# Patient Record
Sex: Male | Born: 1980 | Race: Black or African American | Hispanic: No | Marital: Single
Health system: Midwestern US, Community
[De-identification: ages and names within clinical notes are randomized; demographics above are authoritative.]

## PROBLEM LIST (undated history)

## (undated) DIAGNOSIS — S069X9A Unspecified intracranial injury with loss of consciousness of unspecified duration, initial encounter: Secondary | ICD-10-CM

## (undated) DIAGNOSIS — F419 Anxiety disorder, unspecified: Secondary | ICD-10-CM

## (undated) DIAGNOSIS — S12600A Unspecified displaced fracture of seventh cervical vertebra, initial encounter for closed fracture: Secondary | ICD-10-CM

## (undated) DIAGNOSIS — I10 Essential (primary) hypertension: Secondary | ICD-10-CM

## (undated) DIAGNOSIS — F32A Depression, unspecified: Secondary | ICD-10-CM

## (undated) DIAGNOSIS — S069XAA Unspecified intracranial injury with loss of consciousness status unknown, initial encounter: Secondary | ICD-10-CM

## (undated) DIAGNOSIS — E559 Vitamin D deficiency, unspecified: Secondary | ICD-10-CM

## (undated) DIAGNOSIS — J939 Pneumothorax, unspecified: Secondary | ICD-10-CM

## (undated) DIAGNOSIS — I609 Nontraumatic subarachnoid hemorrhage, unspecified: Secondary | ICD-10-CM

## (undated) DIAGNOSIS — R000 Tachycardia, unspecified: Secondary | ICD-10-CM

## (undated) HISTORY — DX: Nontraumatic subarachnoid hemorrhage, unspecified: I60.9

## (undated) HISTORY — DX: Unspecified intracranial injury with loss of consciousness of unspecified duration, initial encounter: S06.9X9A

## (undated) HISTORY — DX: Tachycardia, unspecified: R00.0

## (undated) HISTORY — DX: Unspecified intracranial injury with loss of consciousness status unknown, initial encounter: S06.9XAA

## (undated) HISTORY — PX: APPENDECTOMY: SHX54

## (undated) HISTORY — DX: Pneumothorax, unspecified: J93.9

## (undated) HISTORY — DX: Unspecified displaced fracture of seventh cervical vertebra, initial encounter for closed fracture: S12.600A

## (undated) HISTORY — DX: Vitamin D deficiency, unspecified: E55.9

---

## 2009-08-20 MED ORDER — DOXYCYCLINE 100 MG CAP
100 mg | ORAL_CAPSULE | Freq: Two times a day (BID) | ORAL | Status: AC
Start: 2009-08-20 — End: 2009-08-27

## 2009-08-20 MED ORDER — VALACYCLOVIR 1 G TAB
1 gram | ORAL_TABLET | Freq: Two times a day (BID) | ORAL | Status: AC
Start: 2009-08-20 — End: 2009-08-27

## 2009-08-20 MED ADMIN — azithromycin (ZITHROMAX) tablet 2,000 mg: ORAL | @ 20:00:00 | NDC 68084027811

## 2009-08-20 MED FILL — AZITHROMYCIN 250 MG TAB: 250 mg | ORAL | Qty: 8

## 2009-08-20 NOTE — ED Provider Notes (Signed)
I have personally seen and evaluated patient. I find the patient's history and physical exam are consistent with the PA's NP documentation. I agree with the care provided, treatments rendered, disposition and follow up plan.

## 2009-08-20 NOTE — ED Provider Notes (Signed)
Patient is a 28 y.o. male presenting with penile pain. The history is provided by the patient. No language interpreter was used.   Penis Pain  This is a new problem. The current episode started yesterday. The problem occurs daily. The problem has been gradually worsening. Primary symptoms include dysuria, genital lesions, genital rash, penile pain and swelling.Pertinent negatives include no genital itching, no penile discharge, no testicular mass and no scrotal pain. The symptoms occur during urination and at rest. Pertinent negatives include no nausea, no vomiting, no abdominal pain and no frequency. There has been no fever.  He has tried nothing for the symptoms. The treatment provided no relief. Sexual activity: sexually active. He inconsistently uses condoms.  Partner displays symptoms of an STD: no.        No past medical history on file.     No past surgical history on file.      No family history on file.     History   Social History   ??? Marital Status: Single     Spouse Name: N/A     Number of Children: N/A   ??? Years of Education: N/A   Occupational History   ??? Not on file.   Social History Main Topics   ??? Tobacco Use: Yes -- 0.5 packs/day   ??? Alcohol Use: Yes   ??? Drug Use: Yes     Special: Marijuana   ??? Sexually Active: Yes   Other Topics Concern   ??? Not on file   Social History Narrative   ??? No narrative on file           ALLERGIES: Pcn      Review of Systems   Constitutional: Negative for fever and chills.   HENT: Negative for neck pain and neck stiffness.    Respiratory: Negative for cough and shortness of breath.    Cardiovascular: Negative for chest pain.   Gastrointestinal: Negative for nausea, vomiting and abdominal pain.   Genitourinary: Positive for dysuria, penile swelling, genital sore and penile pain. Negative for frequency and penile discharge.   Musculoskeletal: Negative for arthralgias.   Skin: Negative for color change.   Neurological: Negative for dizziness.       Filed Vitals:     08/20/2009 12:14 PM   BP: 138/85   Pulse: 64   Temp: 97.6 ??F (36.4 ??C)   Resp: 16   Height: 6' (1.829 m)   Weight: 150 lb (68.04 kg)   SpO2: 98%              Physical Exam   Nursing note and vitals reviewed.  Constitutional: He is oriented to person, place, and time. He appears well-developed and well-nourished. No distress.   HENT:   Head: Normocephalic and atraumatic.   Right Ear: External ear normal.   Left Ear: External ear normal.   Mouth/Throat: Oropharynx is clear and moist. No oropharyngeal exudate.   Eyes: Conjunctivae and extraocular motions are normal. Pupils are equal, round, and reactive to light. Right eye exhibits no discharge. Left eye exhibits no discharge. No scleral icterus.   Neck: Normal range of motion. Neck supple. No tracheal deviation present. No thyromegaly present.   Cardiovascular: Normal rate, regular rhythm, normal heart sounds and intact distal pulses.    No murmur heard.  Pulmonary/Chest: Effort normal and breath sounds normal. No respiratory distress. He has no wheezes. He has no rales.   Abdominal: Soft. Bowel sounds are normal. He exhibits mass. He exhibits no distension.  No tenderness. He has no rebound and no guarding.        Several swollen tender lymph nodes L femoral area   Genitourinary: Swelling present in the scrotum and/or testes.   Musculoskeletal: Normal range of motion. He exhibits no edema and no tenderness.   Lymphadenopathy:     He has no cervical adenopathy.   Neurological: He is alert and oriented to person, place, and time. No cranial nerve deficit. Coordination normal.   Skin: Skin is warm. Rash noted. No erythema.        Multiple blisters -4 on head of penis no discharge mild swelling distal shank and head of penis.   Psychiatric: He has a normal mood and affect. His behavior is normal. Judgment and thought content normal.        Coding    Procedures

## 2009-08-24 LAB — HSV CULTURE WITHOUT TYPING

## 2015-06-27 ENCOUNTER — Emergency Department: Admit: 2015-06-28 | Payer: Self-pay

## 2015-06-27 DIAGNOSIS — R0789 Other chest pain: Secondary | ICD-10-CM

## 2015-06-27 NOTE — ED Notes (Signed)
Reports pain to L chest " a stinging pain" off and on.  First started a few years ago.  Pain is chronic.  States that pain worsened at 3pm today, intermittent, intense per patient.  Also complaining of dizziness.  Reports SOB earlier during onset at 3pm today, but has since resolved.  Denies nausea.  Increased pain upon palpation to L side of chest.  Denies heavy lifting or exercises.      Emergency Department Nursing Plan of Care       The Nursing Plan of Care is developed from the Nursing assessment and Emergency Department Attending provider initial evaluation.  The plan of care may be reviewed in the ???ED Provider note???.    The Plan of Care was developed with the following considerations:   Patient / Family readiness to learn indicated ZO:XWRUEAVWUJby:verbalized understanding  Persons(s) to be included in education: patient  Barriers to Learning/Limitations:No    Signed     Jayme CloudShannon P Richardson    06/27/2015   8:21 PM

## 2015-06-27 NOTE — ED Notes (Signed)
Discharge completed by provider.

## 2015-06-27 NOTE — ED Provider Notes (Signed)
HPI Comments: Shawn Hodge is a 34 y.o. male who presents ambulatory to [facility] ED with cc of acute on chronic onset of left chest soreness to the area surrounding the left nipple. Pt notes that symptoms first onset a few years ago while he was in jail and have since recurred numerous times, but notes that pain often resolves after a short period of time without intervention. He describes the pain as a "knot" in the left chest wall and pain surrounding the left nipple. Pt states that tonight's exacerbation of pain has been more severe than prior incidents and has not resolved, prompting his presentation to the ED. He states that tonight's symptoms onset with associated SOB, which he states has since resolved. He denies taking any medications for relief of symptoms or any other relieving or exacerbating factors. He denies any recent lifting or pulling motions and he specifically denies any fevers, chills, nausea, vomiting, headache, rash, diarrhea, sweating or weight loss.    WGN:FAOZPCP:None    PMHx: none  Social Hx: + smoking; - EtOH; - drug use    There are no other complains, changes, or physical findings at this time.    The history is provided by the patient. No language interpreter was used.        History reviewed. No pertinent past medical history.    History reviewed. No pertinent past surgical history.      History reviewed. No pertinent family history.    Social History     Social History   ??? Marital status: SINGLE     Spouse name: N/A   ??? Number of children: N/A   ??? Years of education: N/A     Occupational History   ??? Not on file.     Social History Main Topics   ??? Smoking status: Current Some Day Smoker     Packs/day: 0.50   ??? Smokeless tobacco: Not on file   ??? Alcohol use No   ??? Drug use: No   ??? Sexual activity: Yes     Other Topics Concern   ??? Not on file     Social History Narrative         ALLERGIES: Pcn [penicillins]    Review of Systems    Constitutional: Negative.  Negative for activity change, appetite change, chills, fatigue, fever and unexpected weight change.   HENT: Negative.  Negative for congestion, hearing loss, rhinorrhea, sneezing and voice change.    Eyes: Negative.  Negative for pain and visual disturbance.   Respiratory: Positive for shortness of breath. Negative for apnea, cough, choking and chest tightness.    Cardiovascular: Positive for chest pain. Negative for palpitations.   Gastrointestinal: Negative.  Negative for abdominal distention, abdominal pain, blood in stool, diarrhea, nausea and vomiting.   Genitourinary: Negative.  Negative for difficulty urinating, flank pain, frequency and urgency.        No discharge   Musculoskeletal: Negative.  Negative for arthralgias, back pain, myalgias and neck stiffness.   Skin: Negative.  Negative for color change and rash.   Neurological: Negative.  Negative for dizziness, seizures, syncope, speech difficulty, weakness, numbness and headaches.   Hematological: Negative for adenopathy.   Psychiatric/Behavioral: Negative.  Negative for agitation, behavioral problems, dysphoric mood and suicidal ideas. The patient is not nervous/anxious.    All other systems reviewed and are negative.      Patient Vitals for the past 12 hrs:   Temp Pulse Resp BP SpO2   06/27/15 2133 - - - -  100 %   06/27/15 2131 - - - (!) 134/99 -   06/27/15 2011 98.1 ??F (36.7 ??C) 65 15 (!) 132/96 100 %            Physical Exam   Nursing note and vitals reviewed.  Physical Examination: General appearance - WDWN, in no apparent distress  Head - NC/AT  Eyes - pupils equal, round  and reactive, extraocular eye movements intact, conj/sclera clear, anicteric  Mouth - mucous membranes moist, pharynx normal without lesions  Nose/Ears - nares clear, Tms & canals clear  Neck - supple, no significant adenopathy, trachea midline, no crepitus, c spine diffusely non-tender, no step offs   Chest - Normal respiratory effort, clear to auscultation bilaterally, no wheezes/rales/rhonchi; tenderness of the left chest wall  Heart - normal rate and regular rhythm, S1 and S2 normal, no murmurs, gallops, or rubs  Abdomen - soft, nontender, nondistended, nabs, no masses, guarding, rebound or rigidity  Neurological - alert, oriented, normal speech, cranial nerves intact, no focal motor findings, motor & sensory diffusely intact, normal gait  Extremities/MS - peripheral pulses normal, no pedal edema, all joints atraumatic, FROM, non-tender, no gross deformities, spine diffusely non-tender  Skin - normal coloration and turgor, no rashes, no lesions or lacerations      MDM  Number of Diagnoses or Management Options  Diagnosis management comments: DDx: costochondritis, cyst, pericarditis, pneumothorax       Amount and/or Complexity of Data Reviewed  Clinical lab tests: reviewed and ordered  Tests in the radiology section of CPT??: ordered and reviewed  Tests in the medicine section of CPT??: ordered and reviewed  Review and summarize past medical records: yes  Independent visualization of images, tracings, or specimens: yes      ED Course       Procedures    EKG interpretation: (20:42)  Rhythm: sinus bradycardia; and regular . Rate (approx.): 55 bpm; ST/T wave: ST elevation with early repolarization.    LABORATORY TESTS:  Recent Results (from the past 12 hour(s))   EKG, 12 LEAD, INITIAL    Collection Time: 06/27/15  8:42 PM   Result Value Ref Range    Ventricular Rate 55 BPM    Atrial Rate 55 BPM    P-R Interval 194 ms    QRS Duration 96 ms    Q-T Interval 380 ms    QTC Calculation (Bezet) 363 ms    Calculated P Axis 21 degrees    Calculated R Axis 73 degrees    Calculated T Axis 53 degrees    Diagnosis       Sinus bradycardia  ST elevation, probably due to early repolarization  No previous ECGs available     POC TROPONIN-I    Collection Time: 06/27/15  9:28 PM   Result Value Ref Range     Troponin-I (POC) <0.04 0.00 - 0.08 ng/mL   POC CHEM8    Collection Time: 06/27/15  9:35 PM   Result Value Ref Range    Calcium, ionized (POC) 1.14 1.12 - 1.32 MMOL/L    Sodium (POC) 136 136 - 145 MMOL/L    Potassium (POC) 3.8 3.5 - 5.1 MMOL/L    Chloride (POC) 104 98 - 107 MMOL/L    CO2 (POC) 27 21 - 32 MMOL/L    Anion gap (POC) 10 5 - 15 mmol/L    Glucose (POC) 100 75 - 110 MG/DL    BUN (POC) 17 9 - 20 MG/DL    Creatinine (POC) 1.0  0.6 - 1.3 MG/DL    GFR-AA (POC) >25 >36 ml/min/1.27m2    GFR, non-AA (POC) >60 >60 ml/min/1.24m2    Hemoglobin (POC) 15.3 12.1 - 17.0 GM/DL    Hematocrit (POC) 45 36.6 - 50.3 %    Comment Comment Not Indicated.         IMAGING RESULTS:  EXAM: XR CHEST PA LAT  ??  INDICATION: cp  ??  COMPARISON: None.  ??  FINDINGS: PA and lateral radiographs of the chest demonstrate clear lungs. The  cardiac and mediastinal contours and pulmonary vascularity are normal. The  bones and soft tissues are within normal limits. ??  ??  IMPRESSION  IMPRESSION: No acute abnormality identified  ??   ??       MEDICATIONS GIVEN:  Medications   ketorolac (TORADOL) injection 60 mg (60 mg IntraMUSCular Given 06/27/15 2119)       IMPRESSION:  1. Muscular chest pain        PLAN:  1.   Current Discharge Medication List      START taking these medications    Details   ibuprofen (MOTRIN) 600 mg tablet Take 1 Tab by mouth every six (6) hours as needed for Pain.  Qty: 20 Tab, Refills: 0           2.   Follow-up Information     Follow up With Details Comments Contact Info    Henrietta Hoover, NP   [address] [facility] [address]  [address]  [facility] [place] [address]  [phone]      Hayes Ludwig, MD   [address]  [address]  [place] [address]  [phone]          3. Return to ED if worse     Discharge Note:  9:59 PM  The patient has been re-evaluated and is ready for discharge. Reviewed available results with patient. Counseled patient on diagnosis and care  plan. Patient has expressed understanding, and all questions have been answered. Patient agrees with plan and agrees to follow up as recommended, or to return to the ED if their symptoms worsen. Discharge instructions have been provided and explained to the patient, along with reasons to return to the ED.        This note is prepared by John Giovanni, acting as Scribe for [employer]. Dorothey Baseman, MD.    Vergia Alcon S. Dorothey Baseman, MD: The scribe's documentation has been prepared under my direction and personally reviewed by me in its entirety. I confirm that the note above accurately reflects all work, treatment, procedures, and medical decision making performed by me.

## 2015-06-27 NOTE — ED Notes (Signed)
20G IV inserted to L AC.  Labs drawn.

## 2015-06-28 ENCOUNTER — Inpatient Hospital Stay
Admit: 2015-06-28 | Discharge: 2015-06-28 | Disposition: A | Discharge status: Home / Self Care | Payer: Self-pay | Attending: Emergency Medicine

## 2015-06-28 LAB — POC CHEM8
Anion gap (POC): 10 mmol/L (ref 5–15)
BUN (POC): 17 MG/DL (ref 9–20)
CO2 (POC): 27 MMOL/L (ref 21–32)
Calcium, ionized (POC): 1.14 MMOL/L (ref 1.12–1.32)
Chloride (POC): 104 MMOL/L (ref 98–107)
Creatinine (POC): 1 MG/DL (ref 0.6–1.3)
GFRAA, POC: >60 mL/min/{1.73_m2} (ref >60)
GFRNA, POC: >60 mL/min/{1.73_m2} (ref >60)
Glucose (POC): 100 MG/DL (ref 75–110)
Hematocrit (POC): 45 % (ref 36.6–50.3)
Hemoglobin (POC): 15.3 GM/DL (ref 12.1–17.0)
Potassium (POC): 3.8 MMOL/L (ref 3.5–5.1)
Sodium (POC): 136 MMOL/L (ref 136–145)

## 2015-06-28 LAB — POC TROPONIN-I: Troponin-I (POC): <0.04 ng/mL (ref 0.00–0.08)

## 2015-06-28 LAB — EKG, 12 LEAD, INITIAL
Atrial Rate: 55 {beats}/min
Calculated P Axis: 21 degrees
Calculated R Axis: 73 degrees
Calculated T Axis: 53 degrees
P-R Interval: 194 ms
Q-T Interval: 380 ms
QRS Duration: 96 ms
QTC Calculation (Bezet): 363 ms
Ventricular Rate: 55 {beats}/min

## 2015-06-28 MED ORDER — IBUPROFEN 600 MG TAB
600 mg | ORAL_TABLET | Freq: Four times a day (QID) | ORAL | 0 refills | Status: DC | PRN
Start: 2015-06-28 — End: 2016-02-23

## 2015-06-28 MED ORDER — KETOROLAC TROMETHAMINE 30 MG/ML INJECTION
30 mg/mL (1 mL) | INTRAMUSCULAR | Status: AC
Start: 2015-06-28 — End: 2015-06-27
  Administered 2015-06-28: 01:00:00 via INTRAMUSCULAR

## 2015-06-28 MED FILL — KETOROLAC TROMETHAMINE 30 MG/ML INJECTION: 30 mg/mL (1 mL) | INTRAMUSCULAR | Qty: 2

## 2016-02-23 ENCOUNTER — Emergency Department: Admit: 2016-02-23 | Payer: Self-pay

## 2016-02-23 ENCOUNTER — Inpatient Hospital Stay
Admit: 2016-02-23 | Discharge: 2016-02-25 | Disposition: A | Discharge status: Home / Self Care | Payer: Self-pay | Attending: Internal Medicine | Admitting: Internal Medicine

## 2016-02-23 DIAGNOSIS — A419 Sepsis, unspecified organism: Principal | ICD-10-CM

## 2016-02-23 LAB — METABOLIC PANEL, COMPREHENSIVE
A-G Ratio: 0.6 — ABNORMAL LOW (ref 1.1–2.2)
ALT (SGPT): 16 U/L (ref 12–78)
AST (SGOT): 9 U/L — ABNORMAL LOW (ref 15–37)
Albumin: 2.9 g/dL — ABNORMAL LOW (ref 3.5–5.0)
Alk. phosphatase: 49 U/L (ref 45–117)
Anion gap: 9 mmol/L (ref 5–15)
BUN/Creatinine ratio: 9 — ABNORMAL LOW (ref 12–20)
BUN: 9 MG/DL (ref 6–20)
Bilirubin, total: 0.9 MG/DL (ref 0.2–1.0)
CO2: 28 mmol/L (ref 21–32)
Calcium: 8.7 MG/DL (ref 8.5–10.1)
Chloride: 95 mmol/L — ABNORMAL LOW (ref 97–108)
Creatinine: 1.03 MG/DL (ref 0.70–1.30)
GFR est AA: >60 mL/min/{1.73_m2} (ref >60)
GFR est non-AA: >60 mL/min/{1.73_m2} (ref >60)
Globulin: 5.2 g/dL — ABNORMAL HIGH (ref 2.0–4.0)
Glucose: 112 mg/dL — ABNORMAL HIGH (ref 65–100)
Potassium: 3.1 mmol/L — ABNORMAL LOW (ref 3.5–5.1)
Protein, total: 8.1 g/dL (ref 6.4–8.2)
Sodium: 132 mmol/L — ABNORMAL LOW (ref 136–145)

## 2016-02-23 LAB — DRUG SCREEN, URINE
AMPHETAMINES: NEGATIVE
BARBITURATES: NEGATIVE
BENZODIAZEPINES: NEGATIVE
COCAINE: NEGATIVE
METHADONE: NEGATIVE
OPIATES: NEGATIVE
PCP(PHENCYCLIDINE): NEGATIVE
THC (TH-CANNABINOL): POSITIVE — AB

## 2016-02-23 LAB — CBC WITH AUTOMATED DIFF
ABS. BASOPHILS: 0 10*3/uL (ref 0.0–0.1)
ABS. EOSINOPHILS: 0 10*3/uL (ref 0.0–0.4)
ABS. LYMPHOCYTES: 0.9 10*3/uL (ref 0.8–3.5)
ABS. MONOCYTES: 1.9 10*3/uL — ABNORMAL HIGH (ref 0.0–1.0)
ABS. NEUTROPHILS: 14.8 10*3/uL — ABNORMAL HIGH (ref 1.8–8.0)
BASOPHILS: 0 % (ref 0–1)
EOSINOPHILS: 0 % (ref 0–7)
HCT: 38.8 % (ref 36.6–50.3)
HGB: 13.4 g/dL (ref 12.1–17.0)
LYMPHOCYTES: 5 % — ABNORMAL LOW (ref 12–49)
MCH: 31.3 PG (ref 26.0–34.0)
MCHC: 34.5 g/dL (ref 30.0–36.5)
MCV: 90.7 FL (ref 80.0–99.0)
MONOCYTES: 11 % (ref 5–13)
NEUTROPHILS: 84 % — ABNORMAL HIGH (ref 32–75)
PLATELET: 282 10*3/uL (ref 150–400)
RBC: 4.28 M/uL (ref 4.10–5.70)
RDW: 12.5 % (ref 11.5–14.5)
WBC: 17.6 10*3/uL — ABNORMAL HIGH (ref 4.1–11.1)

## 2016-02-23 LAB — D DIMER: D-dimer: 0.51 mg/L FEU (ref 0.00–0.65)

## 2016-02-23 LAB — BLOOD GAS, ARTERIAL
BASE EXCESS: 1.2 mmol/L
BICARBONATE: 26 mmol/L (ref 22–26)
O2 SAT: 95 % (ref 92–97)
PCO2: 44 mmHg (ref 35.0–45.0)
PO2: 76 mmHg — ABNORMAL LOW (ref 80–100)
pH: 7.4 (ref 7.35–7.45)

## 2016-02-23 LAB — URINALYSIS W/ RFLX MICROSCOPIC
Bilirubin: NEGATIVE
Glucose: NEGATIVE mg/dL
Ketone: 15 mg/dL — AB
Leukocyte Esterase: NEGATIVE
Nitrites: NEGATIVE
Protein: NEGATIVE mg/dL
Specific gravity: 1.01 (ref 1.003–1.030)
Urobilinogen: 1 EU/dL (ref 0.2–1.0)
pH (UA): 6 (ref 5.0–8.0)

## 2016-02-23 LAB — CK W/ CKMB & INDEX
CK - MB: <1 NG/ML (ref <3.6)
CK: 59 U/L (ref 39–308)

## 2016-02-23 LAB — TROPONIN I: Troponin-I, Qt.: <0.04 ng/mL (ref <0.05)

## 2016-02-23 LAB — LACTIC ACID: Lactic acid: 0.8 MMOL/L (ref 0.4–2.0)

## 2016-02-23 LAB — URINE MICROSCOPIC ONLY: Bacteria: NEGATIVE /hpf

## 2016-02-23 LAB — D-DIMER, QUANTITATIVE: D-Dimer, Quant: 0.51 mg/L FEU (ref 0.00–0.65)

## 2016-02-23 MED ORDER — SODIUM CHLORIDE 0.9% BOLUS IV
0.9 % | Freq: Once | INTRAVENOUS | Status: AC
Start: 2016-02-23 — End: 2016-02-23
  Administered 2016-02-23: 13:00:00 via INTRAVENOUS

## 2016-02-23 MED ORDER — ACETAMINOPHEN 325 MG TABLET
325 mg | Freq: Four times a day (QID) | ORAL | Status: DC | PRN
Start: 2016-02-23 — End: 2016-02-25
  Administered 2016-02-23 – 2016-02-25 (×5): via ORAL

## 2016-02-23 MED ORDER — ALBUTEROL SULFATE 0.083 % (0.83 MG/ML) SOLN FOR INHALATION
2.5 mg /3 mL (0.083 %) | RESPIRATORY_TRACT | Status: DC | PRN
Start: 2016-02-23 — End: 2016-02-25

## 2016-02-23 MED ORDER — IPRATROPIUM-ALBUTEROL 2.5 MG-0.5 MG/3 ML NEB SOLUTION
2.5 mg-0.5 mg/3 ml | Freq: Four times a day (QID) | RESPIRATORY_TRACT | Status: DC
Start: 2016-02-23 — End: 2016-02-25
  Administered 2016-02-23 – 2016-02-25 (×8): via RESPIRATORY_TRACT

## 2016-02-23 MED ORDER — SODIUM CHLORIDE 0.9% BOLUS IV
0.9 % | Freq: Once | INTRAVENOUS | Status: DC
Start: 2016-02-23 — End: 2016-02-23
  Administered 2016-02-23: 13:00:00 via INTRAVENOUS

## 2016-02-23 MED ORDER — CEFTRIAXONE 2 GRAM SOLUTION FOR INJECTION
2 gram | INTRAMUSCULAR | Status: DC
Start: 2016-02-23 — End: 2016-02-25
  Administered 2016-02-24 – 2016-02-25 (×2): via INTRAVENOUS

## 2016-02-23 MED ORDER — MORPHINE 2 MG/ML INJECTION
2 mg/mL | INTRAMUSCULAR | Status: AC
Start: 2016-02-23 — End: 2016-02-23
  Administered 2016-02-23: 16:00:00 via INTRAVENOUS

## 2016-02-23 MED ORDER — SODIUM CHLORIDE 0.9 % IJ SYRG
Freq: Three times a day (TID) | INTRAMUSCULAR | Status: DC
Start: 2016-02-23 — End: 2016-02-25
  Administered 2016-02-23 – 2016-02-25 (×8): via INTRAVENOUS

## 2016-02-23 MED ORDER — SODIUM CHLORIDE 0.9 % IV
INTRAVENOUS | Status: DC
Start: 2016-02-23 — End: 2016-02-24
  Administered 2016-02-23 – 2016-02-24 (×4): via INTRAVENOUS

## 2016-02-23 MED ORDER — AZITHROMYCIN 500 MG IV SOLUTION
500 mg | INTRAVENOUS | Status: AC
Start: 2016-02-23 — End: 2016-02-23
  Administered 2016-02-23: 15:00:00 via INTRAVENOUS

## 2016-02-23 MED ORDER — NAPROXEN 250 MG TAB
250 mg | Freq: Three times a day (TID) | ORAL | Status: DC | PRN
Start: 2016-02-23 — End: 2016-02-23
  Administered 2016-02-23: 18:00:00 via ORAL

## 2016-02-23 MED ORDER — ADV ADDAPTOR
2 gram | Status: DC
Start: 2016-02-23 — End: 2016-02-23
  Administered 2016-02-23: 17:00:00 via INTRAVENOUS

## 2016-02-23 MED ORDER — ACETAMINOPHEN 500 MG TAB
500 mg | ORAL | Status: AC
Start: 2016-02-23 — End: 2016-02-23
  Administered 2016-02-23: 13:00:00 via ORAL

## 2016-02-23 MED ORDER — SODIUM CHLORIDE 0.9 % IV PIGGY BACK
500 mg | INTRAVENOUS | Status: DC
Start: 2016-02-23 — End: 2016-02-25
  Administered 2016-02-24 – 2016-02-25 (×2): via INTRAVENOUS

## 2016-02-23 MED ORDER — NAPROXEN 250 MG TAB
250 mg | Freq: Three times a day (TID) | ORAL | Status: DC | PRN
Start: 2016-02-23 — End: 2016-02-25
  Administered 2016-02-24 – 2016-02-25 (×5): via ORAL

## 2016-02-23 MED ORDER — CEFTRIAXONE 2 GRAM SOLUTION FOR INJECTION
2 gram | INTRAMUSCULAR | Status: DC
Start: 2016-02-23 — End: 2016-02-23

## 2016-02-23 MED ORDER — NICOTINE 14 MG/24 HR DAILY PATCH
14 mg/24 hr | Freq: Every day | TRANSDERMAL | Status: DC
Start: 2016-02-23 — End: 2016-02-25

## 2016-02-23 MED ORDER — SODIUM CHLORIDE 0.9 % IJ SYRG
INTRAMUSCULAR | Status: DC | PRN
Start: 2016-02-23 — End: 2016-02-25

## 2016-02-23 MED ORDER — ADV ADDAPTOR
1 gram | Status: AC
Start: 2016-02-23 — End: 2016-02-23
  Administered 2016-02-23: 15:00:00 via INTRAVENOUS

## 2016-02-23 MED ORDER — GUAIFENESIN 100 MG/5 ML ORAL LIQUID
100 mg/5 mL | ORAL | Status: DC | PRN
Start: 2016-02-23 — End: 2016-02-25
  Administered 2016-02-23 – 2016-02-25 (×9): via ORAL

## 2016-02-23 MED ORDER — POTASSIUM CHLORIDE SR 10 MEQ TAB
10 mEq | ORAL | Status: AC
Start: 2016-02-23 — End: 2016-02-23
  Administered 2016-02-23: 15:00:00 via ORAL

## 2016-02-23 MED ORDER — ONDANSETRON (PF) 4 MG/2 ML INJECTION
4 mg/2 mL | INTRAMUSCULAR | Status: AC
Start: 2016-02-23 — End: 2016-02-23
  Administered 2016-02-23: 16:00:00 via INTRAVENOUS

## 2016-02-23 MED ORDER — IPRATROPIUM-ALBUTEROL 2.5 MG-0.5 MG/3 ML NEB SOLUTION
2.5 mg-0.5 mg/3 ml | RESPIRATORY_TRACT | Status: DC | PRN
Start: 2016-02-23 — End: 2016-02-23

## 2016-02-23 MED FILL — ONDANSETRON (PF) 4 MG/2 ML INJECTION: 4 mg/2 mL | INTRAMUSCULAR | Qty: 2

## 2016-02-23 MED FILL — NAPROXEN 250 MG TAB: 250 mg | ORAL | Qty: 2

## 2016-02-23 MED FILL — MAPAP EXTRA STRENGTH 500 MG TABLET: 500 mg | ORAL | Qty: 2

## 2016-02-23 MED FILL — BD POSIFLUSH NORMAL SALINE 0.9 % INJECTION SYRINGE: INTRAMUSCULAR | Qty: 10

## 2016-02-23 MED FILL — GUAIFENESIN 100 MG/5 ML ORAL LIQUID: 100 mg/5 mL | ORAL | Qty: 10

## 2016-02-23 MED FILL — CEFTRIAXONE 1 GRAM SOLUTION FOR INJECTION: 1 gram | INTRAMUSCULAR | Qty: 1

## 2016-02-23 MED FILL — IPRATROPIUM-ALBUTEROL 2.5 MG-0.5 MG/3 ML NEB SOLUTION: 2.5 mg-0.5 mg/3 ml | RESPIRATORY_TRACT | Qty: 3

## 2016-02-23 MED FILL — SODIUM CHLORIDE 0.9 % IV: INTRAVENOUS | Qty: 1000

## 2016-02-23 MED FILL — SODIUM CHLORIDE 0.9 % IV: INTRAVENOUS | Qty: 1750

## 2016-02-23 MED FILL — AZITHROMYCIN 500 MG IV SOLUTION: 500 mg | INTRAVENOUS | Qty: 5

## 2016-02-23 MED FILL — MAPAP (ACETAMINOPHEN) 325 MG TABLET: 325 mg | ORAL | Qty: 2

## 2016-02-23 MED FILL — NICOTINE 14 MG/24 HR DAILY PATCH: 14 mg/24 hr | TRANSDERMAL | Qty: 1

## 2016-02-23 MED FILL — POTASSIUM CHLORIDE SR 10 MEQ TAB: 10 mEq | ORAL | Qty: 4

## 2016-02-23 MED FILL — MORPHINE 2 MG/ML INJECTION: 2 mg/mL | INTRAMUSCULAR | Qty: 1

## 2016-02-23 NOTE — ED Notes (Signed)
Patient having ill sx for 1 week and getting worst, + productive cough and tachycardic. Dr. Lenox PondsEnglish at bedside evaluating patient. Septic protocol initiated.            Emergency Department Nursing Plan of Care       The Nursing Plan of Care is developed from the Nursing assessment and Emergency Department Attending provider initial evaluation.  The plan of care may be reviewed in the ???ED Provider note???.    The Plan of Care was developed with the following considerations:   Patient / Family readiness to learn indicated ZO:XWRUEAVWUJby:verbalized understanding  Persons(s) to be included in education: patient  Barriers to Learning/Limitations:No    Signed     Lucillie Garfinkeldward Forbin, RN    02/23/2016   9:45 AM

## 2016-02-23 NOTE — ED Notes (Addendum)
Pt tolerated fluids and accepted plan of care to be admited to hospital. Report called to unit and report accepted.      TRANSFER - OUT REPORT:    Verbal report given to Heather R.N(name) on Shawn Hodge  being transferred to 2nd floor(unit) for routine progression of care       Report consisted of patient???s Situation, Background, Assessment and   Recommendations(SBAR).     Information from the following report(s) SBAR, Kardex and ED Summary was reviewed with the receiving nurse.    Lines:       Opportunity for questions and clarification was provided.      Patient transported with:   Registered Nurse

## 2016-02-23 NOTE — Progress Notes (Signed)
CM opened case for assessment of D/C planning needs, CM reviewed chart. Pt presented to ED via with flu like symptoms . CM assessment ongoing and detailed note to follow.    MaizeBristole Johnson, Kentucky[place]CS[place]  [phone]

## 2016-02-23 NOTE — ED Notes (Signed)
Pt return from X-Ray awaiting disposition

## 2016-02-23 NOTE — ED Provider Notes (Signed)
Patient is a 35 y.o. male presenting with chest pain.   Chest Pain (Angina)    Associated symptoms include cough, diaphoresis, dizziness, a fever (subjective) and shortness of breath. Pertinent negatives include no abdominal pain, no back pain, no nausea, no palpitations and no vomiting. Weakness: generalized.         To ED with one week h/o left lateral chest pain, shortness of breath, cough and fatigue.  Symptoms have steadily worsened throughout the week. Pain is worse with movement of trunk, deep breath. No injury to chest.  Shortness of breath worse today, so came to ED.  Cough with small/mod amount non bloody whitish sputum.  Describes shaking chills and subjective fever.  Some sore throat and runny nose. Rare marijuana, denies any other drug use. Smoker. Denies h/o blood clots. No recent periods of prolonged sitting/lying.         No past medical history on file.    No past surgical history on file.      No family history on file.    Social History     Social History   ??? Marital status: SINGLE     Spouse name: N/A   ??? Number of children: N/A   ??? Years of education: N/A     Occupational History   ??? Not on file.     Social History Main Topics   ??? Smoking status: Current Some Day Smoker     Packs/day: 0.50   ??? Smokeless tobacco: Not on file   ??? Alcohol use No   ??? Drug use: No   ??? Sexual activity: Yes     Other Topics Concern   ??? Not on file     Social History Narrative         ALLERGIES: Pcn [penicillins]    Review of Systems   Constitutional: Positive for activity change, chills, diaphoresis, fatigue and fever (subjective). Negative for appetite change and unexpected weight change.   HENT: Positive for congestion and rhinorrhea. Negative for dental problem, ear pain, mouth sores and voice change. Sore throat: slight.    Eyes: Negative for pain, discharge and redness.   Respiratory: Positive for cough and shortness of breath. Negative for choking, wheezing and stridor.     Cardiovascular: Positive for chest pain. Negative for palpitations and leg swelling.   Gastrointestinal: Negative for abdominal pain, blood in stool, nausea and vomiting. Diarrhea: loose stool.   Genitourinary: Negative for discharge, dysuria, flank pain, hematuria, testicular pain and urgency.   Musculoskeletal: Negative for back pain, gait problem and neck pain.   Skin: Negative for rash and wound.   Neurological: Positive for dizziness and light-headedness. Negative for seizures and syncope. Weakness: generalized.    All other systems reviewed and are negative.      Vitals:    02/23/16 0917   BP: 123/84   Pulse: (!) 112   Resp: (!) 40   Temp: 99.9 ??F (37.7 ??C)   SpO2: 94%   Weight: 56.7 kg (125 lb)   Height:  (1.803 m)            Physical Exam   Constitutional: He is oriented to person, place, and time. He appears well-developed.   Appears uncomfortable.   RR 44.    HENT:   Head: Normocephalic and atraumatic.   Right Ear: External ear normal.   Left Ear: External ear normal.   Nose: Nose normal.   Mouth/Throat: Oropharynx is clear and moist.   Eyes: Conjunctivae and  EOM are normal. Pupils are equal, round, and reactive to light.   Neck: Normal range of motion. Neck supple.   Cardiovascular: Normal rate, regular rhythm and normal heart sounds.    Pulmonary/Chest: He has no wheezes. He has no rales. He exhibits tenderness.   Grimaces and holds left lateral lower chest when moves in stretcher.  Pain with palpation to lateral chest. No rash.   Diminished breath sounds bilaterally.    Abdominal: Soft. Bowel sounds are normal. There is no tenderness. There is no rebound.   Musculoskeletal: Normal range of motion.   Neurological: He is alert and oriented to person, place, and time. He has normal reflexes.   Skin: Skin is warm and dry. He is not diaphoretic.   Psychiatric: He has a normal mood and affect. His behavior is normal.   Nursing note and vitals reviewed.       MDM   Number of Diagnoses or Management Options  Diagnosis management comments: DDX:     ED Course       Procedures      Dr. Lenox PondsEnglish also saw/evaluated [name].     10:37 AM  Sepsis pathway has been initiated.   K low - replacement ordered.   WBC elevated, left shift.   PNA per CXR. ABG ordered. Fluids running.   Lactic acid not elevated.   ABG ordered.       Dr. Lenox PondsEnglish discussed case with Dr. Sallee LangeVarma who will admit.   Case, labs, plan of care reviewed.       Admit to hospital for continued PNA treatment.

## 2016-02-23 NOTE — Other (Signed)
..  TRANSFER - IN REPORT:    Verbal report received from Eddie, Charity fundraiserN (name) on Shawn CollinsChristopher Hodge  being received from [place], RN(unit) for routine progression of care      Report consisted of patient???s Situation, Background, Assessment and   Recommendations(SBAR).     Information from the following report(s) SBAR, Kardex, MAR, Accordion and Recent Results was reviewed with the receiving nurse.    Opportunity for questions and clarification was provided.      Assessment completed upon patient???s arrival to unit and care assumed.

## 2016-02-23 NOTE — ED Triage Notes (Signed)
Pt arrives by EMS with complaint of flu like symptoms x 6 days, pt reports left sided chest pain x 6 days worse on inspiration.

## 2016-02-23 NOTE — H&P (Signed)
Hospitalist Admission Note    NAME: Shawn Hodge   DOB:  December 08, 1980   MRN:  [id number]   Room Number: 956/21  @ [facility]     Date/Time:  02/23/2016 1:00 PM    Patient PCP: None  ______________________________________________________________________    My assessment of this patient's clinical condition and my plan of care is as follows.    Assessment / Plan:    Sepsis (HCC)POA sec to  Left lower lobe pneumonia POA  Not severe sepsis or shock. Lactic acid normal.Increased RR.   Wbc 17,Temp 100.8  NS bolus followed by NS'@150cc'$ /hr  iv Ceftriaxone and Azithromycin  sputum cx, bld cx  urine for  pneumococcal and legionella Ag    Hypokalemia:POA  Replete    Marijuana abuse   UDS pos for THC  Patient was counseled extensively on the need to abstain from using illicit drugs, its addictive tendencies, its deleterious effects on the brain and other organs as well as its financial and social sequelae.    Tobacco abuse   Nicotine patch offered  Patient was counseled extensively on the need to abstain from tobacco, its addictive tendencies, its deleterious effects on the lungs as well as its financial sequelae    Code Status: full  Surrogate Decision Maker:competent    DVT Prophylaxis: SCD's  GI Prophylaxis: not indicated    Baseline: independent        Subjective:   CHIEF COMPLAINT: sob/cough    HISTORY OF PRESENT ILLNESS:     Shawn Hodge is a 35 y.o.  African American male with no significant PMH  who presents to ED with c/o chest pain,cough and SOB.Patient states that he has been having productive cough with yellow sputum production, fever and chills since the past one week which has been getting progressively worse. He states he went to [facility] but was discharged from ED stating that he had common cold. He states he has two children who have upper respiratory tract symptoms .  he smokes half a pack per day  and uses marijuana occasionally.he denies any travel history and works at  [employer] as a Programme researcher, broadcasting/film/video .   In ED, Temp 100.8,WBC elevated and cxr significant for left lower lobe pneumonia .Sepsis protocol and antibiotics initiated in ED.    We were asked to admit for work up and evaluation of the above problems.         History reviewed. No pertinent past medical history. Patient dnies any hx of HTN,DM.     History reviewed.  pertinent surgical history appendectomy    Social History   Substance Use Topics   ??? Smoking status: Current Some Day Smoker     Packs/day: 0.50   ??? Smokeless tobacco: Not on file   ??? Alcohol use No        History reviewed. pertinent family history HTN in mom    Allergies   Allergen Reactions   ??? Pcn [Penicillins] Hives        Prior to Admission medications    Not on File       REVIEW OF SYSTEMS:     I am not able to complete the review of systems because:   The patient is intubated and sedated    The patient has altered mental status due to his acute medical problems    The patient has baseline aphasia from prior stroke(s)    The patient has baseline dementia and is not reliable historian    The patient  is in acute medical distress and unable to provide information           Total of 12 systems reviewed as follows:       POSITIVE= underlined text  Negative = text not underlined  General:  fever, chills, sweats, generalized weakness, weight loss/gain,      loss of appetite   Eyes:    blurred vision, eye pain, loss of vision, double vision  ENT:    rhinorrhea, pharyngitis   Respiratory:   cough, sputum production, SOB, DOE, wheezing, pleuritic pain   Cardiology:   chest pain, palpitations, orthopnea, PND, edema, syncope   Gastrointestinal:  abdominal pain , N/V, diarrhea, dysphagia, constipation, bleeding   Genitourinary:  frequency, urgency, dysuria, hematuria, incontinence   Muskuloskeletal :  arthralgia, myalgia, back pain  Hematology:  easy bruising, nose or gum bleeding, lymphadenopathy   Dermatological: rash, ulceration, pruritis, color change / jaundice   Endocrine:   hot flashes or polydipsia   Neurological:  headache, dizziness, confusion, focal weakness, paresthesia,     Speech difficulties, memory loss, gait difficulty  Psychological: Feelings of anxiety, depression, agitation    Objective:   VITALS:    Visit Vitals   ??? BP 134/81   ??? Pulse 87   ??? Temp 98.7 ??F (37.1 ??C)   ??? Resp 22   ??? Ht _0  (1.803 m)   ??? Wt 56.7 kg (125 lb)   ??? SpO2 98%   ??? BMI 17.43 kg/m2       PHYSICAL EXAM:    General:    Alert, cooperative, no distress, appears stated age.     HEENT: Atraumatic, anicteric sclerae, pink conjunctivae     No oral ulcers, mucosa moist, throat clear, dentition fair  Neck:  Supple, symmetrical,  thyroid: non tender  Lungs:   Decreased BS on left side.  No Wheezing or Rhonchi. No rales.  Chest wall:  No tenderness  No Accessory muscle use.  Heart:   Regular  rhythm,  No  murmur   No edema  Abdomen:   Soft, non-tender. Not distended.  Bowel sounds normal  Extremities: No cyanosis.  No clubbing,      Skin turgor normal, Capillary refill normal, Radial dial pulse 2+  Skin:     Not pale.  Not Jaundiced  No rashes   Psych:  Good insight.  Not depressed.  Not anxious or agitated.  Neurologic: EOMs intact. No facial asymmetry. No aphasia or slurred speech. Symmetrical strength, Sensation grossly intact. Alert and oriented X 4.     ______________________________________________________________________    Care Plan discussed with:  Patient/Family and Nurse    Expected  Disposition:  Home w/Family  ________________________________________________________________________  TOTAL TIME:  49 Minutes    Critical Care Provided     Minutes non procedure based      Comments     Reviewed previous records   >50% of visit spent in counseling and coordination of care x Discussion with patient and/or family and questions answered       ________________________________________________________________________  Signed: Ree Kida, MD     Procedures: see electronic medical records for all procedures/Xrays and details which were not copied into this note but were reviewed prior to creation of Plan.    LAB DATA REVIEWED:    Recent Results (from the past 24 hour(s))   EKG, 12 LEAD, INITIAL    Collection Time: 02/23/16  9:21 AM   Result Value Ref Range    Ventricular Rate  99 BPM    Atrial Rate 99 BPM    P-R Interval 176 ms    QRS Duration 92 ms    Q-T Interval 324 ms    QTC Calculation (Bezet) 415 ms    Calculated P Axis 49 degrees    Calculated R Axis 69 degrees    Calculated T Axis 62 degrees    Diagnosis       Normal sinus rhythm  Normal ECG  When compared with ECG of 27-Jun-2015 20:42,  Vent. rate has increased BY  44 BPM     CBC WITH AUTOMATED DIFF    Collection Time: 02/23/16  9:33 AM   Result Value Ref Range    WBC 17.6 (H) 4.1 - 11.1 K/uL    RBC 4.28 4.10 - 5.70 M/uL    HGB 13.4 12.1 - 17.0 g/dL    HCT 38.8 36.6 - 50.3 %    MCV 90.7 80.0 - 99.0 FL    MCH 31.3 26.0 - 34.0 PG    MCHC 34.5 30.0 - 36.5 g/dL    RDW 12.5 11.5 - 14.5 %    PLATELET 282 150 - 400 K/uL    NEUTROPHILS 84 (H) 32 - 75 %    LYMPHOCYTES 5 (L) 12 - 49 %    MONOCYTES 11 5 - 13 %    EOSINOPHILS 0 0 - 7 %    BASOPHILS 0 0 - 1 %    ABS. NEUTROPHILS 14.8 (H) 1.8 - 8.0 K/UL    ABS. LYMPHOCYTES 0.9 0.8 - 3.5 K/UL    ABS. MONOCYTES 1.9 (H) 0.0 - 1.0 K/UL    ABS. EOSINOPHILS 0.0 0.0 - 0.4 K/UL    ABS. BASOPHILS 0.0 0.0 - 0.1 K/UL    DF SMEAR SCANNED      RBC COMMENTS NORMOCYTIC, NORMOCHROMIC     METABOLIC PANEL, COMPREHENSIVE    Collection Time: 02/23/16  9:33 AM   Result Value Ref Range    Sodium 132 (L) 136 - 145 mmol/L    Potassium 3.1 (L) 3.5 - 5.1 mmol/L    Chloride 95 (L) 97 - 108 mmol/L    CO2 28 21 - 32 mmol/L    Anion gap 9 5 - 15 mmol/L    Glucose 112 (H) 65 - 100 mg/dL    BUN 9 6 - 20 MG/DL    Creatinine 1.03 0.70 - 1.30 MG/DL    BUN/Creatinine ratio 9 (L) 12 - 20      GFR est AA >60 >60 ml/min/1.93m    GFR est non-AA >60 >60 ml/min/1.791m   Calcium 8.7 8.5 - 10.1 MG/DL     Bilirubin, total 0.9 0.2 - 1.0 MG/DL    ALT (SGPT) 16 12 - 78 U/L    AST (SGOT) 9 (L) 15 - 37 U/L    Alk. phosphatase 49 45 - 117 U/L    Protein, total 8.1 6.4 - 8.2 g/dL    Albumin 2.9 (L) 3.5 - 5.0 g/dL    Globulin 5.2 (H) 2.0 - 4.0 g/dL    A-G Ratio 0.6 (L) 1.1 - 2.2     LACTIC ACID, PLASMA    Collection Time: 02/23/16  9:33 AM   Result Value Ref Range    Lactic acid 0.8 0.4 - 2.0 MMOL/L   URINALYSIS W/ RFLX MICROSCOPIC    Collection Time: 02/23/16  9:33 AM   Result Value Ref Range    Color YELLOW/STRAW      Appearance CLEAR CLEAR  Specific gravity 1.010 1.003 - 1.030      pH (UA) 6.0 5.0 - 8.0      Protein NEGATIVE  NEG mg/dL    Glucose NEGATIVE  NEG mg/dL    Ketone 15 (A) NEG mg/dL    Bilirubin NEGATIVE  NEG      Blood SMALL (A) NEG      Urobilinogen 1.0 0.2 - 1.0 EU/dL    Nitrites NEGATIVE  NEG      Leukocyte Esterase NEGATIVE  NEG     D DIMER    Collection Time: 02/23/16  9:33 AM   Result Value Ref Range    D-dimer 0.51 0.00 - 0.65 mg/L FEU   TROPONIN I    Collection Time: 02/23/16  9:33 AM   Result Value Ref Range    Troponin-I, Qt. <0.04 <0.05 ng/mL   DRUG SCREEN, URINE    Collection Time: 02/23/16  9:33 AM   Result Value Ref Range    AMPHETAMINE NEGATIVE  NEG      BARBITURATES NEGATIVE  NEG      BENZODIAZEPINE NEGATIVE  NEG      COCAINE NEGATIVE  NEG      METHADONE NEGATIVE  NEG      OPIATES NEGATIVE  NEG      PCP(PHENCYCLIDINE) NEGATIVE  NEG      THC (TH-CANNABINOL) POSITIVE (A) NEG      Drug screen comment (NOTE)    CK W/ CKMB & INDEX    Collection Time: 02/23/16  9:33 AM   Result Value Ref Range    CK 59 39 - 308 U/L    CK - MB <1.0 <3.6 NG/ML    CK-MB Index Cannot be calulated 0.0 - 2.5     URINE MICROSCOPIC ONLY    Collection Time: 02/23/16  9:33 AM   Result Value Ref Range    WBC 0-4 0 - 4 /hpf    RBC 0-5 0 - 5 /hpf    Epithelial cells FEW FEW /lpf    Bacteria NEGATIVE  NEG /hpf   BLOOD GAS, ARTERIAL    Collection Time: 02/23/16 11:12 AM   Result Value Ref Range    pH 7.40 7.35 - 7.45       PCO2 44 35.0 - 45.0 mmHg    PO2 76 (L) 80 - 100 mmHg    O2 SAT 95 92 - 97 %    BICARBONATE 26 22 - 26 mmol/L    BASE EXCESS 1.2 mmol/L    O2 METHOD ROOM AIR      Sample source ARTERIAL      SITE RIGHT BRACHIAL      ALLEN'S TEST YES

## 2016-02-23 NOTE — Progress Notes (Addendum)
1325) Pt arrive to unit.  1636) Consult Dr. Sallee LangeVarma for PR interval 0.2 on telemetry.  No need for further EKG at this time.  Pt order to continue oxygen 2L for tachypnea  1840) consult Dr. Sallee LangeVarma for pain medication. Order to continue naproxen and give tylenol for break through pain.  1905) Bedside shift change report given to Diane, RN (oncoming nurse) by Herbert SetaHeather, RN (offgoing nurse). Report included the following information SBAR, Kardex, MAR, Accordion, Recent Results and Cardiac Rhythm NSR, almost first degree block.

## 2016-02-24 LAB — CBC W/O DIFF
HCT: 35.7 % — ABNORMAL LOW (ref 36.6–50.3)
HGB: 12.3 g/dL (ref 12.1–17.0)
MCH: 31.4 PG (ref 26.0–34.0)
MCHC: 34.5 g/dL (ref 30.0–36.5)
MCV: 91.1 FL (ref 80.0–99.0)
PLATELET: 260 10*3/uL (ref 150–400)
RBC: 3.92 M/uL — ABNORMAL LOW (ref 4.10–5.70)
RDW: 12.4 % (ref 11.5–14.5)
WBC: 13.2 10*3/uL — ABNORMAL HIGH (ref 4.1–11.1)

## 2016-02-24 LAB — METABOLIC PANEL, BASIC
Anion gap: 9 mmol/L (ref 5–15)
BUN/Creatinine ratio: 6 — ABNORMAL LOW (ref 12–20)
BUN: 6 MG/DL (ref 6–20)
CO2: 28 mmol/L (ref 21–32)
Calcium: 8.1 MG/DL — ABNORMAL LOW (ref 8.5–10.1)
Chloride: 104 mmol/L (ref 97–108)
Creatinine: 0.95 MG/DL (ref 0.70–1.30)
GFR est AA: >60 mL/min/{1.73_m2} (ref >60)
GFR est non-AA: >60 mL/min/{1.73_m2} (ref >60)
Glucose: 110 mg/dL — ABNORMAL HIGH (ref 65–100)
Potassium: 3.3 mmol/L — ABNORMAL LOW (ref 3.5–5.1)
Sodium: 141 mmol/L (ref 136–145)

## 2016-02-24 LAB — EKG, 12 LEAD, INITIAL
Atrial Rate: 99 {beats}/min
Calculated P Axis: 49 degrees
Calculated R Axis: 69 degrees
Calculated T Axis: 62 degrees
Diagnosis: NORMAL
P-R Interval: 176 ms
Q-T Interval: 324 ms
QRS Duration: 92 ms
QTC Calculation (Bezet): 415 ms
Ventricular Rate: 99 {beats}/min

## 2016-02-24 LAB — EKG 12-LEAD
Atrial Rate: 99 {beats}/min
Diagnosis: NORMAL
P Axis: 49 degrees
P-R Interval: 176 ms
Q-T Interval: 324 ms
QRS Duration: 92 ms
QTc Calculation (Bazett): 415 ms
R Axis: 69 degrees
T Axis: 62 degrees
Ventricular Rate: 99 {beats}/min

## 2016-02-24 MED ORDER — POTASSIUM CHLORIDE SR 10 MEQ TAB
10 mEq | ORAL | Status: AC
Start: 2016-02-24 — End: 2016-02-24
  Administered 2016-02-24: 15:00:00 via ORAL

## 2016-02-24 MED FILL — BD POSIFLUSH NORMAL SALINE 0.9 % INJECTION SYRINGE: INTRAMUSCULAR | Qty: 10

## 2016-02-24 MED FILL — GUAIFENESIN 100 MG/5 ML ORAL LIQUID: 100 mg/5 mL | ORAL | Qty: 10

## 2016-02-24 MED FILL — IPRATROPIUM-ALBUTEROL 2.5 MG-0.5 MG/3 ML NEB SOLUTION: 2.5 mg-0.5 mg/3 ml | RESPIRATORY_TRACT | Qty: 3

## 2016-02-24 MED FILL — MAPAP (ACETAMINOPHEN) 325 MG TABLET: 325 mg | ORAL | Qty: 2

## 2016-02-24 MED FILL — POTASSIUM CHLORIDE SR 10 MEQ TAB: 10 mEq | ORAL | Qty: 4

## 2016-02-24 MED FILL — NAPROXEN 250 MG TAB: 250 mg | ORAL | Qty: 2

## 2016-02-24 MED FILL — CEFTRIAXONE 2 GRAM SOLUTION FOR INJECTION: 2 gram | INTRAMUSCULAR | Qty: 2

## 2016-02-24 MED FILL — NICOTINE 14 MG/24 HR DAILY PATCH: 14 mg/24 hr | TRANSDERMAL | Qty: 1

## 2016-02-24 MED FILL — AZITHROMYCIN 500 MG IV SOLUTION: 500 mg | INTRAVENOUS | Qty: 5

## 2016-02-24 NOTE — Progress Notes (Addendum)
Hospitalist Progress Note    NAME: Shawn Hodge   DOB:  1980/11/29   MRN:  [id number]   Room Number:  226/01  @ [facility]       Interim Hospital Summary: 35 y.o. male whom presented on 02/23/2016 with      Assessment / Plan:    Sepsis (HCC)POA imrpoved  Afebrile now, wbc trending down 17->13  sputum cx, bld cx-NGTD    Left lower lobe pneumonia POA   iv Ceftriaxone and Azithromycin day 2  urine for  pneumococcal and legionella Ag-await results  CXR-Left lower lobe airspace disease, suspicious for pneumonia.    Hypokalemia:POA  Replete     Marijuana abuse   UDS pos for [facility]  Patient was counseled extensively on the need to abstain from using illicit drugs, its addictive tendencies, its deleterious effects on the brain and other organs as well as its financial and social sequelae.     Tobacco abuse   Nicotine patch offered  Patient was counseled extensively on the need to abstain from tobacco, its addictive tendencies, its deleterious effects on the lungs as well as its financial sequelae       Code status: Full  Prophylaxis: SCD's  Recommended Disposition: Home w/Family     Subjective:     Chief Complaint / Reason for Physician Visit  "i am much better doc, 70% better".  Discussed with RN events overnight.     Review of Systems:  Symptom Y/N Comments  Symptom Y/N Comments   Fever/Chills    Chest Pain     Poor Appetite    Edema     Cough    Abdominal Pain     Sputum    Joint Pain     SOB/DOE x   Pruritis/Rash     Nausea/vomit    Tolerating PT/OT     Diarrhea    Tolerating Diet x    Constipation    Other       Could NOT obtain due to:      Objective:     VITALS:   Last 24hrs VS reviewed since prior progress note. Most recent are:  Patient Vitals for the past 24 hrs:   Temp Pulse Resp BP SpO2   02/24/16 0926 - - - - 99 %   02/24/16 0750 - - 16 - 95 %   02/24/16 0718 - - - - 100 %   02/24/16 0711 97.8 ??F (36.6 ??C) 60 20 120/74 100 %   02/24/16 0401 97.3 ??F (36.3 ??C) 78 20 112/86 100 %    02/24/16 0020 98 ??F (36.7 ??C) 63 20 118/84 100 %   02/23/16 2012 98.2 ??F (36.8 ??C) 92 20 123/67 98 %   02/23/16 1653 97.7 ??F (36.5 ??C) 83 20 114/67 99 %   02/23/16 1635 - 91 - - -   02/23/16 1327 98.7 ??F (37.1 ??C) 87 22 134/81 98 %   02/23/16 1230 - 83 23 111/76 98 %   02/23/16 1200 - 89 20 116/76 98 %   02/23/16 1130 - 97 23 119/81 98 %       Intake/Output Summary (Last 24 hours) at 02/24/16 1100  Last data filed at 02/24/16 0401   Gross per 24 hour   Intake                0 ml   Output              450 ml  Net             -450 ml        PHYSICAL EXAM:  General: WD, WN. Alert, cooperative, no acute distress????  EENT:  EOMI. Anicteric sclerae. MMM  Resp:  CTA bilaterally, no wheezing or rales.  No accessory muscle use  CV:  Regular  rhythm,?? No edema  GI:  Soft, Non distended, Non tender. ??+Bowel sounds  Neurologic:?? Alert and oriented X 3, normal speech,   Psych:???? Good insight.??Not anxious nor agitated  Skin:  No rashes.  No jaundice    Reviewed most current lab test results and cultures  YES  Reviewed most current radiology test results   YES  Review and summation of old records today    NO  Reviewed patient's current orders and MAR    YES  PMH/SH reviewed - no change compared to H&P  ________________________________________________________________________  Care Plan discussed with:    Comments   Patient     Family      RN     Care Engineer, maintenance (IT)Manager     Consultant                        Multidiciplinary team rounds were held today with case manager, nursing, pharmacist and clinical coordinator.  [name] plan of care was discussed; medications were reviewed and discharge planning was addressed.     ________________________________________________________________________  Total NON critical care TIME:  35   Minutes    Total CRITICAL CARE TIME Spent:   Minutes non procedure based      Comments   >50% of visit spent in counseling and coordination of care x     ________________________________________________________________________  Cyndia DiverKanika W Danel Studzinski, MD     Procedures: see electronic medical records for all procedures/Xrays and details which were not copied into this note but were reviewed prior to creation of Plan.      LABS:  I reviewed today's most current labs and imaging studies.  Pertinent labs include:  Recent Labs      02/24/16   0016  02/23/16   0933   WBC  13.2*  17.6*   HGB  12.3  13.4   HCT  35.7*  38.8   PLT  260  282     Recent Labs      02/24/16   0016  02/23/16   0933   NA  141  132*   K  3.3*  3.1*   CL  104  95*   CO2  28  28   GLU  110*  112*   BUN  6  9   CREA  0.95  1.03   CA  8.1*  8.7   ALB   --   2.9*   TBILI   --   0.9   SGOT   --   9*   ALT   --   16       Signed: Cyndia DiverKanika W Gagandeep Kossman, MD

## 2016-02-24 NOTE — Progress Notes (Addendum)
0700) Bedside shift change report given to Herbert SetaHeather, RN (oncoming nurse) by Silvio PateIiyochi, RN (offgoing nurse). Report included the following information SBAR, Kardex, MAR, Accordion, Recent Results and Cardiac Rhythm NSR.   0718) pt 100% on room air. Nasal cannula removed.  0750) Pt 95% on room air. Respiration 16.  Pt reports no distress.  1000) Dr. Sallee LangeVarma with pt. Possible d/c tomorrow, pt needs work note, oral potassium, able to d/c IV fluids if patient tolerating diet  1653) Telemetry report pt tachy 155, pt ambulated to bathroom, stated he was okay.  1655) Pt heart rate 168, ask pt to bare down.  1700) Pt heart rate now 100, pt return to bed.  1700) Notified Dr. Sallee LangeVarma.  Order to continue to monitor.  1948) Bedside shift change report given to Gar GibbonAnne Marie, RN (oncoming nurse) by Herbert SetaHeather, RN (offgoing nurse). Report included the following information SBAR, Kardex, MAR, Accordion, Recent Results and Cardiac Rhythm NSR to ST.

## 2016-02-24 NOTE — Progress Notes (Signed)
Cont diet as tolerated.  Cont supplements per pt preference.  Ensure ordered for all meals.  Calorie count consult noted and ongoing per MD request.  Pt's intake is improving but he is still not meeting ~ needs for energy and protein.  Hx notable for underweight, tobacco and marijuana abuse.  Pt counseled on same.  Ht: 5'11"  Wt: 132 lb  BMI: 18.41 kg/(m^2) c/w low/normal weight  Est energy needs: 1865 kcal, 72 g protein, 1 mL/kcal fluids  Pt will consume > 75% of meals at follow up.  Currently pt is not meeting this goal.  RD following for calorie count.

## 2016-02-25 LAB — S.PNEUMO AG, UR/CSF: Streptococcus pneumoniae Ag: NEGATIVE

## 2016-02-25 LAB — CBC W/O DIFF
HCT: 32.2 % — ABNORMAL LOW (ref 36.6–50.3)
HGB: 10.8 g/dL — ABNORMAL LOW (ref 12.1–17.0)
MCH: 30.9 PG (ref 26.0–34.0)
MCHC: 33.5 g/dL (ref 30.0–36.5)
MCV: 92 FL (ref 80.0–99.0)
PLATELET: 303 10*3/uL (ref 150–400)
RBC: 3.5 M/uL — ABNORMAL LOW (ref 4.10–5.70)
RDW: 12.7 % (ref 11.5–14.5)
WBC: 9.1 10*3/uL (ref 4.1–11.1)

## 2016-02-25 LAB — LEGIONELLA PNEUMOPHILA AG, URINE: L pneumophila S1 Ag, urine: NEGATIVE

## 2016-02-25 LAB — METABOLIC PANEL, BASIC
Anion gap: 7 mmol/L (ref 5–15)
BUN/Creatinine ratio: 8 — ABNORMAL LOW (ref 12–20)
BUN: 7 MG/DL (ref 6–20)
CO2: 27 mmol/L (ref 21–32)
Calcium: 8.4 MG/DL — ABNORMAL LOW (ref 8.5–10.1)
Chloride: 105 mmol/L (ref 97–108)
Creatinine: 0.83 MG/DL (ref 0.70–1.30)
GFR est AA: >60 mL/min/{1.73_m2} (ref >60)
GFR est non-AA: >60 mL/min/{1.73_m2} (ref >60)
Glucose: 112 mg/dL — ABNORMAL HIGH (ref 65–100)
Potassium: 3.6 mmol/L (ref 3.5–5.1)
Sodium: 139 mmol/L (ref 136–145)

## 2016-02-25 MED ORDER — LEVOFLOXACIN 750 MG TAB
750 mg | ORAL_TABLET | Freq: Every day | ORAL | 0 refills | Status: AC
Start: 2016-02-25 — End: 2016-03-01

## 2016-02-25 MED FILL — GUAIFENESIN 100 MG/5 ML ORAL LIQUID: 100 mg/5 mL | ORAL | Qty: 10

## 2016-02-25 MED FILL — AZITHROMYCIN 500 MG IV SOLUTION: 500 mg | INTRAVENOUS | Qty: 5

## 2016-02-25 MED FILL — NAPROXEN 250 MG TAB: 250 mg | ORAL | Qty: 2

## 2016-02-25 MED FILL — MAPAP (ACETAMINOPHEN) 325 MG TABLET: 325 mg | ORAL | Qty: 2

## 2016-02-25 MED FILL — BD POSIFLUSH NORMAL SALINE 0.9 % INJECTION SYRINGE: INTRAMUSCULAR | Qty: 10

## 2016-02-25 MED FILL — IPRATROPIUM-ALBUTEROL 2.5 MG-0.5 MG/3 ML NEB SOLUTION: 2.5 mg-0.5 mg/3 ml | RESPIRATORY_TRACT | Qty: 3

## 2016-02-25 MED FILL — CEFTRIAXONE 2 GRAM SOLUTION FOR INJECTION: 2 gram | INTRAMUSCULAR | Qty: 2

## 2016-02-25 MED FILL — NICOTINE 14 MG/24 HR DAILY PATCH: 14 mg/24 hr | TRANSDERMAL | Qty: 1

## 2016-02-25 NOTE — Progress Notes (Signed)
RRAT Score:   8  Initial Assessment: CM reviewed chart and met with patient for discharge planning. CM verified patient???s address and contact number as correct on the facesheet. Pt presented to ED via with c/o   SOB, chest pain, and fever. Patient is currently living with a friend. Patient is employee with [employer]. Patient has no PCP.  Patient has consented for CM to make appointment arrangements. Cm informed patient to go to [facility] for F/U care. Patient was given instructions to take his discharge papers, ID, and recent check stubs for financial screening.  Patient has no healthcare benefits.  Patient will need any assistance with medications. CM dept. has given medication voucher for patient???s medications.   Emergency Contact: Patient provided no emergency contact information.  Patient will need assistance with transportation.  The patient will be discharging with Mr. Jerline [facility].  Patient will be discharging today.   JMoon RN CM [phone]      ?

## 2016-02-25 NOTE — Progress Notes (Signed)
Spiritual Care Assessment/Progress Notes    Shawn Hodge [id number]  [id number]    Jan 16, 1981  35 y.o.  male    Patient Telephone Number: [phone] (home)   Religious Affiliation: [employer]   Language: English   Extended Emergency Contact Information  Primary Emergency Contact: [place]  Address: [address]           , [place] [id number] [place]  Home Phone: [phone]  Mobile Phone: [phone]  Relation: Friend   Patient Active Problem List    Diagnosis Date Noted   ??? Sepsis (HCC) 02/23/2016   ??? Left lower lobe pneumonia 02/23/2016   ??? Marijuana abuse 02/23/2016   ??? Hypokalemia 02/23/2016   ??? Tobacco abuse 02/23/2016        Date: 02/25/2016       Level of Religious/Spiritual Activity:           Involved in faith tradition/spiritual practice             Not involved in faith tradition/spiritual practice           Spiritually oriented             Claims no spiritual orientation             seeking spiritual identity           Feels alienated from religious practice/tradition           Feels angry about religious practice/tradition           Spirituality/religious PRAYER IS a resource for coping at this time.           Not able to assess due to medical condition    Services Provided Today:           crisis intervention             reading Scriptures           spiritual assessment             prayer           empathic listening/emotional support           rites and rituals (cite in comments)           life review              religious support           theological development            advocacy           ethical dialog              blessing           bereavement support             support to family           anticipatory grief support            help with AMD           spiritual guidance             meditation      Spiritual Care Needs           Emotional Support           Spiritual/Religious Care           Loss/Adjustment           Advocacy/Referral                /  Ethics            No needs expressed at               this time           Other: (note in               comments)  Spiritual Care Plan           Follow up visits with               pt/family           Provide materials           Schedule sacraments           Contact Community               Clergy           Follow up as needed           Other: (note in               comments)     Comments: Initial spiritual assessment in Med Surg/Tele unit.  [name] is in good spirits, he is to be discharged.  He shared how much better he if feeling.  He has a [place] belief in God and is thankful that he can go home to his children.  He requested prayer. Provided spiritual presence and prayer.  Advised of Chaplain Availability.  Visited by: Encarnacion Slates [facility]  Chaplain Paging Service [phone] -PRAY [phone])

## 2016-02-25 NOTE — Progress Notes (Addendum)
1410hrs Discharge instructions were discussed with patient, opportunity for questions and clarifications were provided. Medication education was also provided. IV access was removed.    1530hrs Patient was discharged to home. Wheeled patient to pharmacy to pick up his medication prescription. Patient still waiting at the lobby for Mr Shawn Hodge for transport home. Patient was alert and oriented, no complaint of any distress or pain.

## 2016-02-25 NOTE — Progress Notes (Signed)
[facility]       02/25/2016      RE: Shawn CollinsChristopher Hodge      To Whom it May Concern:      This is to certify that Shawn CollinsChristopher Hodge was admitted to hospital on 02/23/2016   to 02/25/16. He may return to work on 03/01/16.      Thank you for your assistance in this matter.    Sincerely,      Cyndia DiverKanika W Ryonna Cimini, MD

## 2016-02-25 NOTE — Progress Notes (Signed)
Care Management Interventions  PCP Verified by CM: Yes  Mode of Transport at Discharge:  [place]  Transition of Care Consult (CM Consult): Discharge Planning  Discharge Durable Medical Equipment: No  Physical Therapy Consult: No  Occupational Therapy Consult: No  Current Support Network: Other (Lives with a friend)  Confirm Follow Up Transport: Self  Discharge Location  Discharge Placement: Home with outpatient services    Patient will follow up with [facility] for medical care. CM provided patient with intake and financial screening information.    [place]ole Johnson, [place]  [phone]

## 2016-02-25 NOTE — Discharge Summary (Signed)
Hospitalist Discharge Summary     Patient ID:  Shawn Hodge  [id number]  35 y.o.  1981-05-20    PCP on record: None    Admit date: 02/23/2016  Discharge date and time: 02/25/2016      DISCHARGE DIAGNOSIS:    Sepsis (HCC)POA resolved  Afebrile now, wbc trending down 17->13  sputum cx, bld cx-NGTD  ??  Left lower lobe pneumonia POA   iv Ceftriaxone and Azithromycin day 2, d/c on levaquin  urine for ??pneumococcal and legionella Ag-await results  CXR-Left lower lobe airspace disease, suspicious for pneumonia.  ??  Hypokalemia:POA  Replete  ??  Marijuana abuse   UDS pos for [facility]  Patient was counseled extensively on the need to abstain from using illicit drugs, its addictive tendencies, its deleterious effects on the brain and other organs as well as its financial and social sequelae.  ??  Tobacco abuse   Nicotine patch offered  Patient was counseled extensively on the need to abstain from tobacco, its addictive tendencies, its deleterious effects on the lungs as well as its financial sequelae  ??    CONSULTATIONS:  None    Excerpted HPI from H&P of Cyndia Diver, MD:  Firman Petrow is a 35 y.o. African American male with no significant PMH who presents to ED with c/o chest pain,cough and SOB.Patient states that he has been having productive cough with yellow sputum production, fever and chills since the past one week which has been getting progressively worse. He states he went to [facility] but was discharged from ED stating that he had common cold. He states he has two children who have upper respiratory tract symptoms . he smokes half a pack per day and uses marijuana occasionally.he denies any travel history and works at [employer] as a Production assistant, radio .   In ED, Temp 100.8,WBC elevated and cxr significant for left lower lobe pneumonia .Sepsis protocol and antibiotics initiated in ED.____________________________________________________________________   DISCHARGE SUMMARY/HOSPITAL COURSE:  for full details see H&P, daily progress notes, labs, consult notes.             _______________________________________________________________________  Patient seen and examined by me on discharge day.  Pertinent Findings:  Gen:    Not in distress  Chest: Clear lungs  CVS:   Regular rhythm.  No edema  Abd:  Soft, not distended, not tender  Neuro:  Alert with good insight.  Oriented to person, place, and time   _______________________________________________________________________  DISCHARGE MEDICATIONS:   Discharge Medication List as of 02/25/2016  1:49 PM      START taking these medications    Details   levoFLOXacin (LEVAQUIN) 750 mg tablet Take 1 Tab by mouth daily for 5 days., Print, Disp-5 Tab, R-0             My Recommended Diet, Activity, Wound Care, and follow-up labs are listed in the patient's Discharge Insturctions which I have personally completed and reviewed.    _______________________________________________________________________  DISPOSITION:     Home with Family: x   Home with HH/PT/OT/RN:    SNF/LTC:    SAHR:    OTHER:        Condition at Discharge:  Stable  _______________________________________________________________________  Follow up with:   PCP : None  Follow-up Information     Follow up With Details Comments Contact Info    [facility] On 02/29/2016 please take your discharge papers, ID, and Check stubs.  at  8am  [address]  [place] [id number]  [phone]              Total time in minutes spent coordinating this discharge (includes going over instructions, follow-up, prescriptions, and preparing report for sign off to her PCP) :  30 minutes    Signed:  Cyndia DiverKanika W Mycah Formica, MD

## 2016-02-25 NOTE — Discharge Summary (Signed)
Discharge Summary by Cyndia Diver, MD at 02/25/16 1521                Author: Cyndia Diver, MD  Service: Hospitalist  Author Type: Physician       Filed: 03/03/16 2253  Date of Service: 02/25/16 1521  Status: Signed          Editor: Cyndia Diver, MD (Physician)                                       Hospitalist Discharge Summary        Patient ID:   Shawn Hodge   [id number]   35 y.o.   Jan 18, 1981      PCP on record: None      Admit date: 02/23/2016   Discharge date and time: 02/25/2016         DISCHARGE DIAGNOSIS:      Sepsis (HCC)POA resolved   Afebrile now, wbc trending down 17->13   sputum cx, bld cx-NGTD   ??   Left lower lobe pneumonia POA    iv Ceftriaxone and Azithromycin day 2, d/c on levaquin   urine for ??pneumococcal and legionella Ag-await results   CXR-Left lower lobe airspace disease, suspicious for pneumonia.   ??   Hypokalemia:POA   Replete   ??   Marijuana abuse    UDS pos for [facility]   Patient was counseled extensively on the need to abstain from using illicit drugs, its addictive tendencies, its deleterious effects on the brain and other organs as well as its financial and  social sequelae.   ??   Tobacco abuse    Nicotine patch offered   Patient was counseled extensively on the need to abstain from tobacco, its addictive tendencies, its deleterious effects on the lungs as well as its financial sequelae   ??      CONSULTATIONS:   None      Excerpted HPI from H&P of Cyndia Diver, MD:   Lonn Im is a 35 y.o. African American male with no significant PMH who presents to ED with c/o chest pain,cough and SOB.Patient states that he has been having productive  cough with yellow sputum production, fever and chills since the past one week which has been getting progressively worse. He states he went to [facility] but was discharged from ED stating that he had common cold. He states he has two children who have upper  respiratory tract symptoms . he smokes half a pack per day and uses marijuana  occasionally.he denies any travel history and works at [employer] as a Production assistant, radio .    In ED, Temp 100.8,WBC elevated and cxr significant for left lower lobe pneumonia .Sepsis protocol and antibiotics initiated in ED.____________________________________________________________________   DISCHARGE SUMMARY/HOSPITAL COURSE:   for full details see H&P, daily progress notes, labs, consult notes.                   _______________________________________________________________________   Patient seen and examined by me on discharge day.   Pertinent Findings:   Gen:    Not in distress   Chest: Clear lungs   CVS:   Regular rhythm.  No edema   Abd:  Soft, not distended, not tender   Neuro:  Alert with good insight.  Oriented to person, place, and time    _______________________________________________________________________   DISCHARGE MEDICATIONS:  Discharge Medication List as of 02/25/2016  1:49 PM              START taking these medications          Details        levoFLOXacin (LEVAQUIN) 750 mg tablet  Take 1 Tab by mouth daily for 5 days., Print, Disp-5 Tab, R-0                         My Recommended Diet, Activity, Wound Care, and follow-up labs are listed in the patient's Discharge Insturctions which I have personally completed and reviewed.      _______________________________________________________________________      DISPOSITION:           Home with Family:  x        Home with HH/PT/OT/RN:          SNF/LTC:          SAHR:          OTHER:             Condition at Discharge:  Stable   _______________________________________________________________________   Follow up with:    PCP : None     Follow-up Information        Follow up With  Details  Comments  Contact Info             [facility]  On 02/29/2016  please take your discharge papers, ID, and Check stubs.  at  8am   [address]   [place] [id number]   [phone]                      Total time in minutes spent coordinating this discharge  (includes going over instructions, follow-up, prescriptions, and preparing report for sign off to her PCP) :  30 minutes      Signed:   Cyndia DiverKanika W Chesky Heyer, MD

## 2016-02-26 LAB — CULTURE, RESPIRATORY/SPUTUM/BRONCH W GRAM STAIN

## 2016-02-26 NOTE — Telephone Encounter (Signed)
Care Management Follow-up Call  Reviewed chart. Tried to reach patient with # provided in the system. The # identified a different name from on the face sheet. Did not leave a VM but will follow-up.   Patient did received discharge instruction. We provided medications through a voucher.   Patient plans to follow-up with the [facility]    [facility] Center[facility]vet Mangum-Holmes MSW RN   [phone]

## 2016-02-27 NOTE — Telephone Encounter (Signed)
Second attempt to reach patient to discuss follow up unsuccessful. Male who answered phone stated that the number is incorrect. There are no alternate numbers listed in patient chart.    Shawn Hodge, MSW  [phone]

## 2016-02-28 LAB — CULTURE, BLOOD, PAIRED: Culture result:: NO GROWTH

## 2019-02-18 ENCOUNTER — Inpatient Hospital Stay (HOSPITAL_COMMUNITY): Payer: Medicaid Other

## 2019-02-18 ENCOUNTER — Emergency Department (HOSPITAL_COMMUNITY): Payer: Medicaid Other

## 2019-02-18 ENCOUNTER — Inpatient Hospital Stay (HOSPITAL_COMMUNITY): Payer: Medicaid Other | Admitting: Certified Registered"

## 2019-02-18 ENCOUNTER — Encounter (HOSPITAL_COMMUNITY): Admission: EM | Disposition: A | Discharge status: Rehabilitation Facility | Payer: Self-pay | Source: Home / Self Care

## 2019-02-18 ENCOUNTER — Encounter (HOSPITAL_COMMUNITY): Payer: Self-pay | Admitting: Neurological Surgery

## 2019-02-18 ENCOUNTER — Inpatient Hospital Stay (HOSPITAL_COMMUNITY)
Admission: EM | Admit: 2019-02-18 | Discharge: 2019-03-02 | DRG: 958 | Disposition: A | Discharge status: Rehabilitation Facility | Payer: Medicaid Other | Attending: Student | Admitting: Student

## 2019-02-18 DIAGNOSIS — R402422 Glasgow coma scale score 9-12, at arrival to emergency department: Secondary | ICD-10-CM | POA: Diagnosis present

## 2019-02-18 DIAGNOSIS — Z781 Physical restraint status: Secondary | ICD-10-CM

## 2019-02-18 DIAGNOSIS — S2243XA Multiple fractures of ribs, bilateral, initial encounter for closed fracture: Secondary | ICD-10-CM | POA: Diagnosis not present

## 2019-02-18 DIAGNOSIS — S0292XA Unspecified fracture of facial bones, initial encounter for closed fracture: Secondary | ICD-10-CM

## 2019-02-18 DIAGNOSIS — Y9301 Activity, walking, marching and hiking: Secondary | ICD-10-CM | POA: Diagnosis present

## 2019-02-18 DIAGNOSIS — S0240EA Zygomatic fracture, right side, initial encounter for closed fracture: Secondary | ICD-10-CM | POA: Diagnosis present

## 2019-02-18 DIAGNOSIS — S022XXA Fracture of nasal bones, initial encounter for closed fracture: Secondary | ICD-10-CM | POA: Diagnosis present

## 2019-02-18 DIAGNOSIS — Z03818 Encounter for observation for suspected exposure to other biological agents ruled out: Secondary | ICD-10-CM | POA: Diagnosis not present

## 2019-02-18 DIAGNOSIS — S27329A Contusion of lung, unspecified, initial encounter: Secondary | ICD-10-CM | POA: Diagnosis present

## 2019-02-18 DIAGNOSIS — S82851A Displaced trimalleolar fracture of right lower leg, initial encounter for closed fracture: Secondary | ICD-10-CM | POA: Diagnosis present

## 2019-02-18 DIAGNOSIS — S82141A Displaced bicondylar fracture of right tibia, initial encounter for closed fracture: Principal | ICD-10-CM

## 2019-02-18 DIAGNOSIS — I82441 Acute embolism and thrombosis of right tibial vein: Secondary | ICD-10-CM | POA: Diagnosis not present

## 2019-02-18 DIAGNOSIS — Z1159 Encounter for screening for other viral diseases: Secondary | ICD-10-CM

## 2019-02-18 DIAGNOSIS — J939 Pneumothorax, unspecified: Secondary | ICD-10-CM

## 2019-02-18 DIAGNOSIS — S12600A Unspecified displaced fracture of seventh cervical vertebra, initial encounter for closed fracture: Secondary | ICD-10-CM | POA: Diagnosis not present

## 2019-02-18 DIAGNOSIS — S2239XA Fracture of one rib, unspecified side, initial encounter for closed fracture: Secondary | ICD-10-CM

## 2019-02-18 DIAGNOSIS — E559 Vitamin D deficiency, unspecified: Secondary | ICD-10-CM | POA: Diagnosis present

## 2019-02-18 DIAGNOSIS — I82451 Acute embolism and thrombosis of right peroneal vein: Secondary | ICD-10-CM | POA: Diagnosis not present

## 2019-02-18 DIAGNOSIS — S066X9D Traumatic subarachnoid hemorrhage with loss of consciousness of unspecified duration, subsequent encounter: Secondary | ICD-10-CM | POA: Diagnosis not present

## 2019-02-18 DIAGNOSIS — M79661 Pain in right lower leg: Secondary | ICD-10-CM | POA: Diagnosis present

## 2019-02-18 DIAGNOSIS — S81012A Laceration without foreign body, left knee, initial encounter: Secondary | ICD-10-CM

## 2019-02-18 DIAGNOSIS — G9389 Other specified disorders of brain: Secondary | ICD-10-CM | POA: Diagnosis not present

## 2019-02-18 DIAGNOSIS — S82151A Displaced fracture of right tibial tuberosity, initial encounter for closed fracture: Secondary | ICD-10-CM | POA: Diagnosis not present

## 2019-02-18 DIAGNOSIS — Z539 Procedure and treatment not carried out, unspecified reason: Secondary | ICD-10-CM

## 2019-02-18 DIAGNOSIS — K5900 Constipation, unspecified: Secondary | ICD-10-CM | POA: Diagnosis not present

## 2019-02-18 DIAGNOSIS — S066X9A Traumatic subarachnoid hemorrhage with loss of consciousness of unspecified duration, initial encounter: Secondary | ICD-10-CM | POA: Diagnosis not present

## 2019-02-18 DIAGNOSIS — S064X9A Epidural hemorrhage with loss of consciousness of unspecified duration, initial encounter: Secondary | ICD-10-CM | POA: Diagnosis present

## 2019-02-18 DIAGNOSIS — S02101A Fracture of base of skull, right side, initial encounter for closed fracture: Secondary | ICD-10-CM | POA: Diagnosis not present

## 2019-02-18 DIAGNOSIS — S02121A Fracture of orbital roof, right side, initial encounter for closed fracture: Secondary | ICD-10-CM | POA: Diagnosis not present

## 2019-02-18 DIAGNOSIS — S82142A Displaced bicondylar fracture of left tibia, initial encounter for closed fracture: Secondary | ICD-10-CM

## 2019-02-18 DIAGNOSIS — R000 Tachycardia, unspecified: Secondary | ICD-10-CM

## 2019-02-18 DIAGNOSIS — D62 Acute posthemorrhagic anemia: Secondary | ICD-10-CM | POA: Diagnosis not present

## 2019-02-18 DIAGNOSIS — G8918 Other acute postprocedural pain: Secondary | ICD-10-CM

## 2019-02-18 DIAGNOSIS — S01311A Laceration without foreign body of right ear, initial encounter: Secondary | ICD-10-CM | POA: Diagnosis present

## 2019-02-18 DIAGNOSIS — Y92413 State road as the place of occurrence of the external cause: Secondary | ICD-10-CM

## 2019-02-18 DIAGNOSIS — Z4682 Encounter for fitting and adjustment of non-vascular catheter: Secondary | ICD-10-CM

## 2019-02-18 DIAGNOSIS — Z419 Encounter for procedure for purposes other than remedying health state, unspecified: Secondary | ICD-10-CM

## 2019-02-18 DIAGNOSIS — Z79899 Other long term (current) drug therapy: Secondary | ICD-10-CM | POA: Diagnosis not present

## 2019-02-18 DIAGNOSIS — S270XXA Traumatic pneumothorax, initial encounter: Secondary | ICD-10-CM

## 2019-02-18 DIAGNOSIS — F17210 Nicotine dependence, cigarettes, uncomplicated: Secondary | ICD-10-CM | POA: Diagnosis present

## 2019-02-18 DIAGNOSIS — S069X9A Unspecified intracranial injury with loss of consciousness of unspecified duration, initial encounter: Secondary | ICD-10-CM

## 2019-02-18 DIAGNOSIS — S2249XA Multiple fractures of ribs, unspecified side, initial encounter for closed fracture: Secondary | ICD-10-CM

## 2019-02-18 DIAGNOSIS — Z9889 Other specified postprocedural states: Secondary | ICD-10-CM

## 2019-02-18 DIAGNOSIS — I609 Nontraumatic subarachnoid hemorrhage, unspecified: Secondary | ICD-10-CM | POA: Diagnosis not present

## 2019-02-18 DIAGNOSIS — S066X0A Traumatic subarachnoid hemorrhage without loss of consciousness, initial encounter: Secondary | ICD-10-CM | POA: Diagnosis not present

## 2019-02-18 HISTORY — PX: EXTERNAL FIXATION LEG: SHX1549

## 2019-02-18 LAB — COMPREHENSIVE METABOLIC PANEL
ALT: 80 U/L — ABNORMAL HIGH (ref 0–44)
AST: 116 U/L — ABNORMAL HIGH (ref 15–41)
Albumin: 4.5 g/dL (ref 3.5–5.0)
Alkaline Phosphatase: 37 U/L — ABNORMAL LOW (ref 38–126)
Anion gap: 13 (ref 5–15)
BUN: 5 mg/dL — ABNORMAL LOW (ref 6–20)
CO2: 25 mmol/L (ref 22–32)
Calcium: 9 mg/dL (ref 8.9–10.3)
Chloride: 103 mmol/L (ref 98–111)
Creatinine, Ser: 1.34 mg/dL — ABNORMAL HIGH (ref 0.61–1.24)
GFR calc Af Amer: 37 mL/min — ABNORMAL LOW (ref >60)
GFR calc non Af Amer: 32 mL/min — ABNORMAL LOW (ref >60)
Glucose, Bld: 149 mg/dL — ABNORMAL HIGH (ref 70–99)
Potassium: 3.4 mmol/L — ABNORMAL LOW (ref 3.5–5.1)
Sodium: 141 mmol/L (ref 135–145)
Total Bilirubin: 0.6 mg/dL (ref 0.3–1.2)
Total Protein: 7.9 g/dL (ref 6.5–8.1)

## 2019-02-18 LAB — I-STAT CHEM 8, ED
BUN: 5 mg/dL — ABNORMAL LOW (ref 8–23)
Calcium, Ion: 1.05 mmol/L — ABNORMAL LOW (ref 1.15–1.40)
Chloride: 102 mmol/L (ref 98–111)
Creatinine, Ser: 1.6 mg/dL — ABNORMAL HIGH (ref 0.61–1.24)
Glucose, Bld: 142 mg/dL — ABNORMAL HIGH (ref 70–99)
HCT: 47 % (ref 39.0–52.0)
Hemoglobin: 16 g/dL (ref 13.0–17.0)
Potassium: 3.4 mmol/L — ABNORMAL LOW (ref 3.5–5.1)
Sodium: 140 mmol/L (ref 135–145)
TCO2: 27 mmol/L (ref 22–32)

## 2019-02-18 LAB — POCT I-STAT 7, (LYTES, BLD GAS, ICA,H+H)
Acid-base deficit: 1 mmol/L (ref 0.0–2.0)
Bicarbonate: 24.1 mmol/L (ref 20.0–28.0)
Calcium, Ion: 1.08 mmol/L — ABNORMAL LOW (ref 1.15–1.40)
HCT: 41 % (ref 39.0–52.0)
Hemoglobin: 13.9 g/dL (ref 13.0–17.0)
O2 Saturation: 100 %
Patient temperature: 96
Potassium: 2.9 mmol/L — ABNORMAL LOW (ref 3.5–5.1)
Sodium: 138 mmol/L (ref 135–145)
TCO2: 25 mmol/L (ref 22–32)
pCO2 arterial: 40.3 mmHg (ref 32.0–48.0)
pH, Arterial: 7.379 (ref 7.350–7.450)
pO2, Arterial: 406 mmHg — ABNORMAL HIGH (ref 83.0–108.0)

## 2019-02-18 LAB — POCT I-STAT, CHEM 8
BUN: 5 mg/dL — ABNORMAL LOW (ref 6–20)
Calcium, Ion: 1.05 mmol/L — ABNORMAL LOW (ref 1.15–1.40)
Chloride: 102 mmol/L (ref 98–111)
Creatinine, Ser: 1.6 mg/dL — ABNORMAL HIGH (ref 0.61–1.24)
Glucose, Bld: 142 mg/dL — ABNORMAL HIGH (ref 70–99)
HCT: 47 % (ref 39.0–52.0)
Hemoglobin: 16 g/dL (ref 13.0–17.0)
Potassium: 3.4 mmol/L — ABNORMAL LOW (ref 3.5–5.1)
Sodium: 140 mmol/L (ref 135–145)
TCO2: 27 mmol/L (ref 22–32)

## 2019-02-18 LAB — URINALYSIS, ROUTINE W REFLEX MICROSCOPIC
Bacteria, UA: NONE SEEN
Bilirubin Urine: NEGATIVE
Glucose, UA: NEGATIVE mg/dL
Ketones, ur: NEGATIVE mg/dL
Leukocytes,Ua: NEGATIVE
Nitrite: NEGATIVE
Protein, ur: NEGATIVE mg/dL
Specific Gravity, Urine: 1.03 (ref 1.005–1.030)
pH: 6 (ref 5.0–8.0)

## 2019-02-18 LAB — CBC
HCT: 44.2 % (ref 39.0–52.0)
Hemoglobin: 14.9 g/dL (ref 13.0–17.0)
MCH: 31.9 pg (ref 26.0–34.0)
MCHC: 33.7 g/dL (ref 30.0–36.0)
MCV: 94.6 fL (ref 80.0–100.0)
Platelets: 149 10*3/uL — ABNORMAL LOW (ref 150–400)
RBC: 4.67 MIL/uL (ref 4.22–5.81)
RDW: 14.6 % (ref 11.5–15.5)
WBC: 7.2 10*3/uL (ref 4.0–10.5)
nRBC: 0 % (ref 0.0–0.2)

## 2019-02-18 LAB — CDS SEROLOGY

## 2019-02-18 LAB — PROTIME-INR
INR: 1 (ref 0.8–1.2)
Prothrombin Time: 12.9 seconds (ref 11.4–15.2)

## 2019-02-18 LAB — ETHANOL: Alcohol, Ethyl (B): 266 mg/dL — ABNORMAL HIGH (ref <10)

## 2019-02-18 LAB — MRSA PCR SCREENING: MRSA by PCR: NEGATIVE

## 2019-02-18 LAB — SARS CORONAVIRUS 2 BY RT PCR (HOSPITAL ORDER, PERFORMED IN ~~LOC~~ HOSPITAL LAB): SARS Coronavirus 2: NEGATIVE

## 2019-02-18 LAB — LACTIC ACID, PLASMA: Lactic Acid, Venous: 2.9 mmol/L (ref 0.5–1.9)

## 2019-02-18 LAB — BLOOD PRODUCT ORDER (VERBAL) VERIFICATION

## 2019-02-18 LAB — ABO/RH: ABO/RH(D): B POS

## 2019-02-18 SURGERY — EXTERNAL FIXATION, LOWER EXTREMITY
Anesthesia: General | Laterality: Bilateral

## 2019-02-18 MED ORDER — LIDOCAINE 2% (20 MG/ML) 5 ML SYRINGE
INTRAMUSCULAR | Status: AC
  Filled 2019-02-18: qty 10

## 2019-02-18 MED ORDER — CEFAZOLIN SODIUM-DEXTROSE 2-4 GM/100ML-% IV SOLN
2.0000 g | Freq: Three times a day (TID) | INTRAVENOUS | Status: AC
  Administered 2019-02-18 – 2019-02-19 (×3): 2 g via INTRAVENOUS
  Filled 2019-02-18 (×3): qty 100

## 2019-02-18 MED ORDER — CHLORHEXIDINE GLUCONATE 4 % EX LIQD
60.0000 mL | Freq: Once | CUTANEOUS | Status: DC
  Filled 2019-02-18: qty 60

## 2019-02-18 MED ORDER — MUPIROCIN 2 % EX OINT
TOPICAL_OINTMENT | CUTANEOUS | Status: AC
  Administered 2019-02-18: 11:00:00
  Filled 2019-02-18: qty 22

## 2019-02-18 MED ORDER — FENTANYL CITRATE (PF) 100 MCG/2ML IJ SOLN
INTRAMUSCULAR | Status: AC
  Filled 2019-02-18: qty 2

## 2019-02-18 MED ORDER — HYDRALAZINE HCL 20 MG/ML IJ SOLN
10.0000 mg | INTRAMUSCULAR | Status: DC | PRN
  Administered 2019-02-18: 23:00:00 10 mg via INTRAVENOUS
  Filled 2019-02-18: qty 1

## 2019-02-18 MED ORDER — ONDANSETRON HCL 4 MG/2ML IJ SOLN
4.0000 mg | Freq: Four times a day (QID) | INTRAMUSCULAR | Status: DC | PRN
  Administered 2019-02-21: 12:00:00 4 mg via INTRAVENOUS

## 2019-02-18 MED ORDER — MIDAZOLAM HCL 5 MG/5ML IJ SOLN
INTRAMUSCULAR | Status: DC | PRN
  Administered 2019-02-18: 2 mg via INTRAVENOUS

## 2019-02-18 MED ORDER — ONDANSETRON 4 MG PO TBDP
4.0000 mg | ORAL_TABLET | Freq: Four times a day (QID) | ORAL | Status: DC | PRN
  Filled 2019-02-18: qty 1

## 2019-02-18 MED ORDER — DEXAMETHASONE SODIUM PHOSPHATE 10 MG/ML IJ SOLN
INTRAMUSCULAR | Status: AC
  Filled 2019-02-18: qty 1

## 2019-02-18 MED ORDER — FENTANYL CITRATE (PF) 100 MCG/2ML IJ SOLN: 50.0000 ug | INTRAMUSCULAR | Status: DC | PRN

## 2019-02-18 MED ORDER — ROCURONIUM BROMIDE 10 MG/ML (PF) SYRINGE
PREFILLED_SYRINGE | INTRAVENOUS | Status: AC
  Filled 2019-02-18: qty 10

## 2019-02-18 MED ORDER — ETOMIDATE 2 MG/ML IV SOLN
INTRAVENOUS | Status: AC | PRN
  Administered 2019-02-18: 20 mg via INTRAVENOUS

## 2019-02-18 MED ORDER — VANCOMYCIN HCL 1000 MG IV SOLR
INTRAVENOUS | Status: AC
  Filled 2019-02-18: qty 1000

## 2019-02-18 MED ORDER — ALBUMIN HUMAN 5 % IV SOLN
25.0000 g | Freq: Once | INTRAVENOUS | Status: AC
  Administered 2019-02-18: 25 g via INTRAVENOUS
  Filled 2019-02-18: qty 250

## 2019-02-18 MED ORDER — FENTANYL CITRATE (PF) 100 MCG/2ML IJ SOLN
50.0000 ug | Freq: Once | INTRAMUSCULAR | Status: AC
  Administered 2019-02-18: 07:00:00 50 ug via INTRAVENOUS

## 2019-02-18 MED ORDER — CHLORHEXIDINE GLUCONATE 0.12% ORAL RINSE (MEDLINE KIT)
15.0000 mL | Freq: Two times a day (BID) | OROMUCOSAL | Status: DC
  Administered 2019-02-18 – 2019-02-20 (×4): 15 mL via OROMUCOSAL

## 2019-02-18 MED ORDER — SODIUM CHLORIDE 0.9 % IV SOLN: INTRAVENOUS | Status: DC | PRN

## 2019-02-18 MED ORDER — CEFAZOLIN SODIUM 1 G IJ SOLR
INTRAMUSCULAR | Status: AC
  Filled 2019-02-18: qty 20

## 2019-02-18 MED ORDER — TOBRAMYCIN SULFATE 1.2 G IJ SOLR
INTRAMUSCULAR | Status: AC
  Filled 2019-02-18: qty 1.2

## 2019-02-18 MED ORDER — BISACODYL 10 MG RE SUPP: 10.0000 mg | Freq: Every day | RECTAL | Status: DC | PRN

## 2019-02-18 MED ORDER — POVIDONE-IODINE 10 % EX SWAB: 2.0000 "application " | Freq: Once | CUTANEOUS | Status: DC

## 2019-02-18 MED ORDER — PROPOFOL 1000 MG/100ML IV EMUL
INTRAVENOUS | Status: AC
  Administered 2019-02-18: 07:00:00 20 ug
  Administered 2019-02-18: 06:00:00 20 ug/kg/min
  Administered 2019-02-18 (×2): 20 mg
  Filled 2019-02-18: qty 100

## 2019-02-18 MED ORDER — LACTATED RINGERS IV SOLN
INTRAVENOUS | Status: DC | PRN
  Administered 2019-02-18: 13:00:00 via INTRAVENOUS

## 2019-02-18 MED ORDER — ORAL CARE MOUTH RINSE
15.0000 mL | OROMUCOSAL | Status: DC
  Administered 2019-02-18 – 2019-02-19 (×11): 15 mL via OROMUCOSAL

## 2019-02-18 MED ORDER — LACTATED RINGERS IV SOLN
INTRAVENOUS | Status: DC
  Administered 2019-02-18 – 2019-02-21 (×5): via INTRAVENOUS

## 2019-02-18 MED ORDER — VECURONIUM BROMIDE 10 MG IV SOLR
INTRAVENOUS | Status: AC | PRN
  Administered 2019-02-18: 100 mg via INTRAVENOUS

## 2019-02-18 MED ORDER — IOPAMIDOL (ISOVUE-300) INJECTION 61%
100.0000 mL | Freq: Once | INTRAVENOUS | Status: AC | PRN
  Administered 2019-02-18: 07:00:00 100 mL via INTRAVENOUS

## 2019-02-18 MED ORDER — FENTANYL CITRATE (PF) 250 MCG/5ML IJ SOLN
INTRAMUSCULAR | Status: AC
  Filled 2019-02-18: qty 5

## 2019-02-18 MED ORDER — ACETAMINOPHEN 160 MG/5ML PO SOLN
650.0000 mg | Freq: Four times a day (QID) | ORAL | Status: DC | PRN
  Administered 2019-02-19: 02:00:00 650 mg
  Filled 2019-02-18 (×2): qty 20.3

## 2019-02-18 MED ORDER — POTASSIUM CHLORIDE 10 MEQ/100ML IV SOLN
10.0000 meq | INTRAVENOUS | Status: AC
  Administered 2019-02-18 (×4): 10 meq via INTRAVENOUS
  Filled 2019-02-18 (×3): qty 100

## 2019-02-18 MED ORDER — DOCUSATE SODIUM 100 MG PO CAPS
100.0000 mg | ORAL_CAPSULE | Freq: Two times a day (BID) | ORAL | Status: DC
  Administered 2019-02-19 – 2019-03-02 (×22): 100 mg via ORAL
  Filled 2019-02-18 (×22): qty 1

## 2019-02-18 MED ORDER — CEFAZOLIN SODIUM-DEXTROSE 2-4 GM/100ML-% IV SOLN
2.0000 g | INTRAVENOUS | Status: AC
  Administered 2019-02-18: 2 g via INTRAVENOUS

## 2019-02-18 MED ORDER — CHLORHEXIDINE GLUCONATE CLOTH 2 % EX PADS
6.0000 | MEDICATED_PAD | Freq: Every day | CUTANEOUS | Status: DC
  Administered 2019-02-19 – 2019-02-25 (×6): 6 via TOPICAL

## 2019-02-18 MED ORDER — ONDANSETRON HCL 4 MG/2ML IJ SOLN
INTRAMUSCULAR | Status: AC
  Filled 2019-02-18: qty 4

## 2019-02-18 MED ORDER — TETANUS-DIPHTH-ACELL PERTUSSIS 5-2.5-18.5 LF-MCG/0.5 IM SUSP
0.5000 mL | Freq: Once | INTRAMUSCULAR | Status: AC
  Administered 2019-02-19: 12:00:00 0.5 mL via INTRAMUSCULAR
  Filled 2019-02-18: qty 0.5

## 2019-02-18 MED ORDER — FENTANYL CITRATE (PF) 100 MCG/2ML IJ SOLN
50.0000 ug | INTRAMUSCULAR | Status: DC | PRN
  Administered 2019-02-18 (×4): 100 ug via INTRAVENOUS
  Administered 2019-02-18 (×3): 50 ug via INTRAVENOUS
  Administered 2019-02-19: 03:00:00 100 ug via INTRAVENOUS
  Administered 2019-02-19 (×2): 50 ug via INTRAVENOUS
  Filled 2019-02-18: qty 4
  Filled 2019-02-18 (×3): qty 2

## 2019-02-18 MED ORDER — PROPOFOL 1000 MG/100ML IV EMUL
0.0000 ug/kg/min | INTRAVENOUS | Status: DC
  Administered 2019-02-18: 50 ug/kg/min via INTRAVENOUS
  Administered 2019-02-18 – 2019-02-19 (×3): 40 ug/kg/min via INTRAVENOUS
  Filled 2019-02-18 (×5): qty 100

## 2019-02-18 MED ORDER — METOPROLOL TARTRATE 5 MG/5ML IV SOLN
5.0000 mg | Freq: Four times a day (QID) | INTRAVENOUS | Status: DC | PRN
  Administered 2019-02-18: 23:00:00 5 mg via INTRAVENOUS
  Filled 2019-02-18 (×2): qty 5

## 2019-02-18 MED ORDER — SODIUM CHLORIDE 0.9 % IV SOLN
INTRAVENOUS | Status: DC | PRN
  Administered 2019-02-19 – 2019-02-21 (×2): 250 mL via INTRAVENOUS

## 2019-02-18 MED ORDER — ROCURONIUM BROMIDE 10 MG/ML (PF) SYRINGE
PREFILLED_SYRINGE | INTRAVENOUS | Status: DC | PRN
  Administered 2019-02-18: 50 mg via INTRAVENOUS

## 2019-02-18 MED ORDER — SODIUM CHLORIDE 0.9 % IV SOLN
INTRAVENOUS | Status: DC | PRN
  Administered 2019-02-18: 20 ug/min via INTRAVENOUS

## 2019-02-18 MED ORDER — MIDAZOLAM HCL 2 MG/2ML IJ SOLN
INTRAMUSCULAR | Status: AC
  Filled 2019-02-18: qty 2

## 2019-02-18 SURGICAL SUPPLY — 50 items
BANDAGE ACE 4X5 VEL STRL LF (GAUZE/BANDAGES/DRESSINGS) ×2 IMPLANT
BANDAGE ACE 6X5 VEL STRL LF (GAUZE/BANDAGES/DRESSINGS) ×3 IMPLANT
BAR GLASS FIBER EXFX 11X600 (EXFIX) ×2 IMPLANT
BIT DRILL CANN MED FLUTE 4.0 (BIT) IMPLANT
BIT DRILL ORANGE 4.0 LONG (BIT) ×1 IMPLANT
BNDG COHESIVE 4X5 TAN STRL (GAUZE/BANDAGES/DRESSINGS) ×2 IMPLANT
BNDG GAUZE ELAST 4 BULKY (GAUZE/BANDAGES/DRESSINGS) ×3 IMPLANT
BRUSH SCRUB SURG 4.25 DISP (MISCELLANEOUS) ×4 IMPLANT
CANISTER WOUND CARE 500ML ATS (WOUND CARE) ×1 IMPLANT
CHLORAPREP W/TINT 26ML (MISCELLANEOUS) ×2 IMPLANT
COVER SURGICAL LIGHT HANDLE (MISCELLANEOUS) ×4 IMPLANT
COVER WAND RF STERILE (DRAPES) ×2 IMPLANT
DRAPE C-ARM 42X72 X-RAY (DRAPES) IMPLANT
DRAPE C-ARMOR (DRAPES) ×2 IMPLANT
DRAPE IMP U-DRAPE 54X76 (DRAPES) ×4 IMPLANT
DRAPE ORTHO SPLIT 77X108 STRL (DRAPES) ×2
DRAPE SURG ORHT 6 SPLT 77X108 (DRAPES) ×2 IMPLANT
DRAPE U-SHAPE 47X51 STRL (DRAPES) ×2 IMPLANT
DRILL CANN 4.0MM (BIT) ×2
DRSG MEPITEL 4X7.2 (GAUZE/BANDAGES/DRESSINGS) IMPLANT
ELECT REM PT RETURN 9FT ADLT (ELECTROSURGICAL) ×2
ELECTRODE REM PT RTRN 9FT ADLT (ELECTROSURGICAL) ×1 IMPLANT
GAUZE SPONGE 4X4 12PLY STRL (GAUZE/BANDAGES/DRESSINGS) ×2 IMPLANT
GLOVE BIO SURGEON STRL SZ 6.5 (GLOVE) ×6 IMPLANT
GLOVE BIO SURGEON STRL SZ7.5 (GLOVE) ×8 IMPLANT
GLOVE BIOGEL PI IND STRL 6.5 (GLOVE) ×1 IMPLANT
GLOVE BIOGEL PI IND STRL 7.5 (GLOVE) ×1 IMPLANT
GLOVE BIOGEL PI INDICATOR 6.5 (GLOVE) ×1
GLOVE BIOGEL PI INDICATOR 7.5 (GLOVE) ×1
GOWN STRL REUS W/ TWL LRG LVL3 (GOWN DISPOSABLE) ×2 IMPLANT
GOWN STRL REUS W/TWL LRG LVL3 (GOWN DISPOSABLE) ×2
HALF PIN 5.0X160 (EXFIX) ×2 IMPLANT
KIT BASIN OR (CUSTOM PROCEDURE TRAY) ×2 IMPLANT
KIT PREVENA INCISION MGT 13 (CANNISTER) ×1 IMPLANT
KIT TURNOVER KIT B (KITS) ×2 IMPLANT
MANIFOLD NEPTUNE II (INSTRUMENTS) ×2 IMPLANT
NEEDLE 22X1 1/2 (OR ONLY) (NEEDLE) IMPLANT
NS IRRIG 1000ML POUR BTL (IV SOLUTION) ×2 IMPLANT
PACK ORTHO EXTREMITY (CUSTOM PROCEDURE TRAY) ×2 IMPLANT
PAD ARMBOARD 7.5X6 YLW CONV (MISCELLANEOUS) ×4 IMPLANT
PADDING CAST COTTON 6X4 STRL (CAST SUPPLIES) ×4 IMPLANT
PIN CLAMP 2BAR 75MM BLUE (EXFIX) ×2 IMPLANT
PIN HALF 5.0X200MM (EXFIX) ×2 IMPLANT
SPONGE LAP 18X18 RF (DISPOSABLE) ×2 IMPLANT
STAPLER VISISTAT 35W (STAPLE) ×2 IMPLANT
STRIP CLOSURE SKIN 1/2X4 (GAUZE/BANDAGES/DRESSINGS) IMPLANT
SUT PROLENE 0 CT (SUTURE) IMPLANT
TOWEL OR 17X24 6PK STRL BLUE (TOWEL DISPOSABLE) ×4 IMPLANT
TOWEL OR 17X26 10 PK STRL BLUE (TOWEL DISPOSABLE) ×4 IMPLANT
UNDERPAD 30X30 (UNDERPADS AND DIAPERS) ×2 IMPLANT

## 2019-02-18 NOTE — Transfer of Care (Signed)
Immediate Anesthesia Transfer of Care Note  Patient: Gary Ashley  Procedure(s) Performed: EXTERNAL FIXATION RIGHT LEG, I&D LEFT LEG (Bilateral )  Patient Location: ICU  Anesthesia Type:General  Level of Consciousness: Patient remains intubated per anesthesia plan  Airway & Oxygen Therapy: Patient remains intubated per anesthesia plan and Patient placed on Ventilator (see vital sign flow sheet for setting)  Post-op Assessment: Report given to RN and Post -op Vital signs reviewed and stable  Post vital signs: Reviewed and stable  Last Vitals:  Vitals Value Taken Time  BP    Temp    Pulse    Resp    SpO2      Last Pain:  Vitals:   02/18/19 1206  TempSrc: Axillary         Complications: No apparent anesthesia complications
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
## 2019-02-18 NOTE — Procedures (Signed)
Chest Tube Insertion Procedure Note  Indications:  Clinically significant R pneumothorax  Pre-operative Diagnosis: R pneumothorax  Post-operative Diagnosis: R pneumothorax  Procedure Details  Informed consent was obtained for the procedure, including sedation.  Risks of lung perforation, hemorrhage, arrhythmia, and adverse drug reaction were discussed.   After sterile skin prep, using standard technique, a 14 French tube was placed in the R chest using Seldinger technique. Sutured and hooked to Pleurevac. Sterile dressing.  Findings: air  Estimated Blood Loss:  Minimal         Specimens:  None              Complications:  None; patient tolerated the procedure well.         Disposition: ICU - intubated and hemodynamically stable.         Condition: stable  Violeta Gelinas, MD, MPH, FACS Trauma & General Surgery: [phone]

## 2019-02-18 NOTE — Progress Notes (Signed)
Orthopedic Tech Progress Note Patient Details:  Gary Ashley 09/15/1876 [id number] RN called requesting knee immobilizer for patient Ortho Devices Type of Ortho Device: Knee Immobilizer Ortho Device/Splint Location: LRE Ortho Device/Splint Interventions: Adjustment, Application, Ordered   Post Interventions Patient Tolerated: Well Instructions Provided: Care of device, Adjustment of device   Donald Pore 02/18/2019, 8:58 AM
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
## 2019-02-18 NOTE — ED Notes (Signed)
Patient has large abrasion to to Right temporal area, right cheek is swollen , large abrasion to right anterior shoulder , right and left forearm abrasion. Per ems several upper teeth are loose, blood coming from right ear. Obv. Deformity to right upper tib/fib. Abrasion to right knee. Large laceration to left knee. Patient is alert however very combative on arrival , wanting to sit up, and moving all over the stretcher.

## 2019-02-18 NOTE — ED Notes (Signed)
Patient is resting on stretcher

## 2019-02-18 NOTE — ED Notes (Signed)
Transported to CT with Rn,Resp and MD
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
## 2019-02-18 NOTE — H&P (Addendum)
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  Nose: Nose normal.  Mouth/Throat: Oropharynx is clear and moist.  Eyes: Pupils are equal, round, and reactive to light. Conjunctivae and EOM are normal.  Neck: No JVD present. No tracheal deviation present.  Cardiovascular: Normal rate, regular rhythm, normal heart sounds and intact distal pulses.  Respiratory: Effort normal and breath sounds normal. No stridor. No respiratory distress. He has no wheezes.  GI: Soft. He exhibits no distension. There is no rebound and no guarding.  Genitourinary:    Penis normal.   Musculoskeletal:     Comments: Abrasions over left shoulder, right forearm Deformity at both knees 2+ DP/PT bilaterally Partial degloving over knees L > R   Neurological: He is alert.  Unable to reliably follow commands without yelling and cursing at staff.  Skin: Skin  is warm and dry.      INJURY SUMMARY: 1. Small R SAH 2. Epidural hematoma 3. Moderate pneumocephalus 4. Right tripod fx with zygoma depression 5. Right orbital roof fx extending into sphenoid sinuses 6. Avulsion fx TP at  C7 7. Bilateral 1st & 2nd rib fxs 8. Biapical occult pneumothorax 9. Lower extremity fractures/degloving - plain films pending  PLAN -Neurosurgery consulted - Dr. Venetia Maxon -Ortho consult - Dr. Jena Gauss -ENT consult - Dr. Pollyann Kennedy -Admit to 4N ICU, intubated -Repeat CXR ordered for 2 pm -PPx: SCDs, holding chemical dvt ppx given acute head injuries -Identity is being confirmed; so far, have been unable to locate or speak with any family members until ID confirmed  CRITICAL CARE Performed by: Andria Meuse   Total critical care time: 35 minutes  Critical care time was exclusive of separately billable procedures and treating other patients.  Critical care was necessary to treat or prevent imminent or life-threatening deterioration.  Critical care was time spent personally by me on the following activities: development of treatment plan with patient and/or surrogate as well as nursing, discussions with consultants, evaluation of patient's response to treatment, examination of patient, obtaining history from patient or surrogate, ordering and performing treatments and interventions, ordering and review of laboratory studies, ordering and review of radiographic studies, pulse oximetry and re-evaluation of patient's condition.  Stephanie Coup. Cliffton Asters, M.D. [facility] 02/18/2019, 8:41 AM

## 2019-02-18 NOTE — Progress Notes (Signed)
This RN and Dicie Beam, RN inventoried pt belongings.  Belongings in valuables envelope [phone]: $50.47 (4 ten dollar bills, 8 one dollar bills, 5 quarters, 12 pennies, 8 dimes, and 6 nickels) One debit card ending in -[id number] 1 key 1 black cell phone [address] Identification Card 1 [place] Identification Card  Belongings at bedside: 1 lighter 1 pair black/lime green/red nike air shoes 1 black w/ lime green and red writing nike hat 1 pair cut plaid boxers 1 cut white tank top 1 pair cut black pants w/ 2 condoms in gold wrapper in pocket

## 2019-02-18 NOTE — Consult Note (Signed)
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 FINDINGS: CT HEAD FINDINGS Brain: Pneumocephalus from skull base fractures described below. Small volume subarachnoid hemorrhage along the inferior right frontal lobe. There may be mild right inferior frontal contusion. There is an 8 mm epidural hematoma along the right temporal convexity negative for infarct or  hydrocephalus Vascular: Negative Skull: Right tripod fracture described below. These fractures continue across the roof of the right orbit, traversing the frontal sinuses and leading to pneumocephalus. There is also continuation across the sphenoid sinuses comminution at the anterior clinoid process on the right. No displacement at the planum sphenoidale are sella. No visible displacement along the right optic canal or right carotid canal. CT MAXILLOFACIAL FINDINGS Osseous: Tripod fracture on the right with segmental fracturing of the anterior and posterior walls right maxillary sinus and segmental fracturing of the right zygoma at  the level of the arch and mandibular fossa. There is superior and lateral right orbit fracture. Buckling of these fractures causes mild relative depression of the right zygoma. Fractures continue across the orbital apex along the greater wing sphenoid on the right and across the right frontal sinus and sphenoid sinuses. Orbits: No noted postseptal hemorrhage. There is extraconal gas the upper right orbit adjacent fractures. Sinuses: Right maxillary and bilateral sphenoid hemosinus. CT CERVICAL SPINE FINDINGS Alignment: Normal Skull base and vertebrae: Probable small avulsion fracture from the C7 right transverse process. C6 limbus vertebra Soft tissues and spinal canal: No prevertebral fluid or swelling. No visible canal hematoma.Soft tissue gas from the chest. Disc levels:  No degenerative changes. Upper chest: Reported separately Critical Value/emergent results were called by telephone at the time of interpretation on 02/18/2019 at 7:03 am to Dr. Cliffton Asters , who verbally acknowledged these results. IMPRESSION: Head CT: 1. Small volume subarachnoid hemorrhage along the inferior right frontal convexity. Suspect a contusion will become apparent at follow-up. 2. Epidural hematoma in the right middle cranial fossa measuring up to 8 mm thickness. This is along the sphenoparietal sinus and may be venous. 3. Moderate pneumocephalus due to sinus fractures. Face CT: 1. Right tripod fracture with mild zygoma depression. 2. Fracture through the right orbital roof extending into frontal sinuses and sphenoid sinuses. No displacement along the optic canal or carotid canal. Cervical spine CT: Probable small avulsion fracture from the C7 right transverse process. Electronically Signed   By: Marnee Spring M.D.   On: 02/18/2019 07:12    [id number] except as listed in admit H&P  Blood pressure 108/84, pulse 86, temperature 98.4 F (36.9 C), resp. rate 17, height  (1.702 m), weight 63.5 kg, SpO2 100 %.  PHYSICAL  EXAM: Overall appearance: Intubated, sedated, on ventilator Head: Head is wrapped. Ears: External auditory canals are clear; tympanic membranes are intact in the middle ears are free of any effusion.  Injury of the right external ear described below. Nose: External nose is healthy in appearance. Internal nasal exam free of any lesions or obstruction. Oral Cavity/Pharynx:  There is some dried blood but otherwise there are no mucosal lesions or masses identified.  Exam is limited due to the endotracheal tube. Larynx/Hypopharynx: Deferred Neuro: Unable to assess facial nerves or any other neurologic function due to being sedated on the ventilator. Neck: No palpable neck masses.  Cervical collar in place.  Studies Reviewed: Maxillofacial CT    Procedures: Repair laceration right auricle.  The right ear was prepped with Betadine solution and draped in a standard fashion.  There was a 3-1/2 cm complex laceration of the right auricle extending from the helical rim into the scaphoid.  There was exposed cartilage and the cartilage was lacerated in multiple places but there did not appear to be any missing cartilage.  There also did not appear to be any missing skin.  4-0 chromic suture was used in a running  fashion to reapproximate the skin edges starting in the scaphoid and ending at the helical rim.  There was good skin apposition.  Antibiotic ointment was applied.  He tolerated this well.  Local anesthetic was not needed.   Assessment/Plan: Complicated laceration of the right auricle, repaired at the bedside.  Minimally displaced right trimalleolar fracture.  We will monitor and evaluate again in 1 or 2 weeks.  Very likely this will not require surgical intervention.  Serena Colonel 02/18/2019, 11:01 AM

## 2019-02-18 NOTE — Anesthesia Postprocedure Evaluation (Signed)
Anesthesia Post Note  Patient: Gary Ashley  Procedure(s) Performed: EXTERNAL FIXATION RIGHT LEG, I&D LEFT LEG (Bilateral )     Patient location during evaluation: ICU Anesthesia Type: General Level of consciousness: sedated and patient remains intubated per anesthesia plan Pain management: pain level controlled Vital Signs Assessment: post-procedure vital signs reviewed and stable Respiratory status: respiratory function stable and patient remains intubated per anesthesia plan Cardiovascular status: blood pressure returned to baseline Postop Assessment: no apparent nausea or vomiting Anesthetic complications: no    Last Vitals:  Vitals:   02/18/19 1230 02/18/19 1300  BP: 126/84 (!) 88/53  Pulse: 64 98  Resp: 16 (!) 33  Temp: 37 C 37.1 C  SpO2: 100% 100%    Last Pain:  Vitals:   02/18/19 1206  TempSrc: Axillary                 Kaylyn Layer

## 2019-02-18 NOTE — Consult Note (Signed)
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 Suspect a contusion will become apparent at follow-up. 2. Epidural hematoma in the right middle cranial fossa measuring up to 8 mm thickness. This is along the sphenoparietal sinus and may be venous. 3. Moderate pneumocephalus due to sinus fractures. Face CT: 1. Right tripod fracture with mild zygoma depression. 2. Fracture through the right orbital roof extending into frontal sinuses and sphenoid sinuses. No displacement along the optic canal or carotid canal. Cervical spine CT: Probable small avulsion fracture from the C7 right transverse process. Electronically Signed   By: Marnee Spring M.D.   On: 02/18/2019 07:12    Review of Systems  Unable to perform ROS: Intubated   Blood pressure (!) 76/47, pulse 68, temperature (!) 97.1 F (36.2 C), resp. rate 14, height  (1.702 m), weight 63.5 kg, SpO2 100 %. Physical Exam  Constitutional:  He appears well-developed and well-nourished. No distress.  HENT:  Head: Normocephalic.  Eyes: Conjunctivae are normal. Right eye exhibits no discharge.  Left eye exhibits no discharge. No scleral icterus.  Neck:  C-collar in place  Cardiovascular: Regular rhythm. Tachycardia present.  Respiratory: Effort normal. No respiratory distress.  Musculoskeletal:     Comments: Bilateral shoulder, elbow, wrist, digits- Left shoulder abrasion, no instability, no blocks to motion  Sens  Ax/R/M/U could not assess  Mot   Ax/ R/ PIN/ M/ AIN/ U could not assess  Rad 2+  Pelvis--no traumatic wounds or rash, no ecchymosis, stable to manual stress  RLE Superficial knee abrasion, no ecchymosis or rash  No knee or ankle effusion  Knee grossly unstable and deformed  Sens DPN, SPN, TN could not assess  Motor EHL, ext, flex, evers could not assess  DP 1+, PT 0, No significant edema  LLE Anterior knee degloving, no ecchymosis or rash  No knee or ankle effusion  Knee stable to varus/ valgus and anterior/posterior stress  Sens DPN, SPN, TN could not assess  Motor EHL, ext, flex, evers could not assess  DP 1+, PT 0, No significant edema  Neurological: He is unresponsive.  Skin: Skin is warm and dry. He is not diaphoretic.  Psychiatric:  Intubated    Assessment/Plan: PHBC Right tibia plateau fx -- Plan ex fix by Dr. Jena Gauss later today. NWB. Left knee degloving -- I&D, repair by Dr. Jena Gauss today. WBAT. TBI w/skull fx -- per NS, they have cleared for ortho intervention Facial fxs C7 TVP fx Bilateral rib fxs w/PTX VDRF      -- Rest per trauma service    Freeman Caldron, PA-C Orthopedic Surgery [phone] 02/18/2019, 9:06 AM
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
## 2019-02-18 NOTE — Progress Notes (Signed)
Spoke with patients girlfriend and mother for updates. Numbers are in charts for physician use. Dicie Beam RN BSN.

## 2019-02-19 ENCOUNTER — Inpatient Hospital Stay (HOSPITAL_COMMUNITY): Payer: Medicaid Other

## 2019-02-19 LAB — TYPE AND SCREEN
ABO/RH(D): B POS
Antibody Screen: NEGATIVE
Unit division: 0
Unit division: 0

## 2019-02-19 LAB — PREPARE FRESH FROZEN PLASMA: Unit division: 0

## 2019-02-19 LAB — BPAM RBC
Blood Product Expiration Date: 202006202359
Blood Product Expiration Date: 202006212359
ISSUE DATE / TIME: 202006050602
ISSUE DATE / TIME: 202006050602
Unit Type and Rh: 9500
Unit Type and Rh: 9500

## 2019-02-19 LAB — BPAM FFP
Blood Product Expiration Date: 202006072359
Blood Product Expiration Date: 202006252359
ISSUE DATE / TIME: 202006051514
ISSUE DATE / TIME: 202006051750
Unit Type and Rh: 6200
Unit Type and Rh: 6200

## 2019-02-19 LAB — BASIC METABOLIC PANEL
Anion gap: 11 (ref 5–15)
BUN: 7 mg/dL (ref 6–20)
CO2: 25 mmol/L (ref 22–32)
Calcium: 8.5 mg/dL — ABNORMAL LOW (ref 8.9–10.3)
Chloride: 104 mmol/L (ref 98–111)
Creatinine, Ser: 0.93 mg/dL (ref 0.61–1.24)
GFR calc Af Amer: >60 mL/min (ref >60)
GFR calc non Af Amer: >60 mL/min (ref >60)
Glucose, Bld: 131 mg/dL — ABNORMAL HIGH (ref 70–99)
Potassium: 4.2 mmol/L (ref 3.5–5.1)
Sodium: 140 mmol/L (ref 135–145)

## 2019-02-19 LAB — CBC
HCT: 32.4 % — ABNORMAL LOW (ref 39.0–52.0)
Hemoglobin: 10.7 g/dL — ABNORMAL LOW (ref 13.0–17.0)
MCH: 31.2 pg (ref 26.0–34.0)
MCHC: 33 g/dL (ref 30.0–36.0)
MCV: 94.5 fL (ref 80.0–100.0)
Platelets: 103 10*3/uL — ABNORMAL LOW (ref 150–400)
RBC: 3.43 MIL/uL — ABNORMAL LOW (ref 4.22–5.81)
RDW: 14.7 % (ref 11.5–15.5)
WBC: 9 10*3/uL (ref 4.0–10.5)
nRBC: 0 % (ref 0.0–0.2)

## 2019-02-19 LAB — TRIGLYCERIDES: Triglycerides: 68 mg/dL (ref <150)

## 2019-02-19 MED ORDER — OXYCODONE HCL 5 MG PO TABS
5.0000 mg | ORAL_TABLET | ORAL | Status: DC | PRN
  Administered 2019-02-19 – 2019-02-25 (×24): 10 mg via ORAL
  Administered 2019-02-25: 5 mg via ORAL
  Administered 2019-02-26 – 2019-03-02 (×18): 10 mg via ORAL
  Filled 2019-02-19: qty 1
  Filled 2019-02-19 (×23): qty 2
  Filled 2019-02-19: qty 1
  Filled 2019-02-19 (×12): qty 2
  Filled 2019-02-19: qty 1
  Filled 2019-02-19 (×7): qty 2

## 2019-02-19 MED ORDER — MORPHINE SULFATE (PF) 2 MG/ML IV SOLN
1.0000 mg | INTRAVENOUS | Status: DC | PRN
  Administered 2019-02-19 – 2019-02-28 (×15): 1 mg via INTRAVENOUS
  Filled 2019-02-19 (×15): qty 1

## 2019-02-19 MED ORDER — ORAL CARE MOUTH RINSE
15.0000 mL | OROMUCOSAL | Status: DC
  Administered 2019-02-19 – 2019-02-21 (×9): 15 mL via OROMUCOSAL

## 2019-02-19 MED ORDER — ACETAMINOPHEN 160 MG/5ML PO SOLN
650.0000 mg | Freq: Four times a day (QID) | ORAL | Status: DC | PRN
  Administered 2019-02-19: 14:00:00 650 mg via ORAL
  Filled 2019-02-19: qty 20.3

## 2019-02-19 MED ORDER — ACETAMINOPHEN 325 MG PO TABS
650.0000 mg | ORAL_TABLET | Freq: Four times a day (QID) | ORAL | Status: DC | PRN
  Administered 2019-02-19 – 2019-02-20 (×3): 650 mg via ORAL
  Filled 2019-02-19 (×3): qty 2

## 2019-02-19 NOTE — Progress Notes (Signed)
Assisted tele visit to patient with mother.  Bascom Biel Ann, RN  
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
## 2019-02-19 NOTE — Progress Notes (Signed)
Assisted tele visit to patient with wife.  Lynnley Doddridge Ann, RN  

## 2019-02-19 NOTE — Procedures (Signed)
Extubation Procedure Note  Patient Details:   Name: Gary Ashley DOB: 03/02/1981 MRN: [id number]   Airway Documentation:  Airway 7.5 mm (Active)  Secured at (cm) 24 cm 02/19/2019  8:32 AM  Measured From Lips 02/19/2019  8:32 AM  [place] 02/19/2019  8:32 AM  Secured By [employer] 02/19/2019  8:32 AM  Tube Holder Repositioned Yes 02/19/2019  8:32 AM  Cuff Pressure (cm H2O) 28 cm H2O 02/19/2019  8:32 AM  Site Condition Dry 02/19/2019  8:32 AM   Vent end date: (not recorded) Vent end time: (not recorded)   Evaluation  O2 sats: stable throughout Complications: No apparent complications Patient did tolerate procedure well. Bilateral Breath Sounds: Clear, Diminished   Yes  Extubated patient to 3L nasal canula. Patient had good productive cough and was oriented to time and place.  Dimple Nanas 02/19/2019, 9:07 AM

## 2019-02-20 ENCOUNTER — Inpatient Hospital Stay (HOSPITAL_COMMUNITY): Payer: Medicaid Other

## 2019-02-20 LAB — CBC
HCT: 25.8 % — ABNORMAL LOW (ref 39.0–52.0)
Hemoglobin: 8.6 g/dL — ABNORMAL LOW (ref 13.0–17.0)
MCH: 31.7 pg (ref 26.0–34.0)
MCHC: 33.3 g/dL (ref 30.0–36.0)
MCV: 95.2 fL (ref 80.0–100.0)
Platelets: 93 10*3/uL — ABNORMAL LOW (ref 150–400)
RBC: 2.71 MIL/uL — ABNORMAL LOW (ref 4.22–5.81)
RDW: 13.9 % (ref 11.5–15.5)
WBC: 6.9 10*3/uL (ref 4.0–10.5)
nRBC: 0 % (ref 0.0–0.2)

## 2019-02-20 LAB — BASIC METABOLIC PANEL
Anion gap: 8 (ref 5–15)
BUN: 5 mg/dL — ABNORMAL LOW (ref 6–20)
CO2: 30 mmol/L (ref 22–32)
Calcium: 8.9 mg/dL (ref 8.9–10.3)
Chloride: 100 mmol/L (ref 98–111)
Creatinine, Ser: 0.81 mg/dL (ref 0.61–1.24)
GFR calc Af Amer: >60 mL/min (ref >60)
GFR calc non Af Amer: >60 mL/min (ref >60)
Glucose, Bld: 118 mg/dL — ABNORMAL HIGH (ref 70–99)
Potassium: 4.3 mmol/L (ref 3.5–5.1)
Sodium: 138 mmol/L (ref 135–145)

## 2019-02-20 LAB — TRIGLYCERIDES: Triglycerides: 51 mg/dL (ref <150)
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
## 2019-02-20 NOTE — Progress Notes (Signed)
Patient ID: Gary Ashley, male   DOB: 1981/03/17, 38 y.o.   MRN: [id number]  He is looking much better today, he is awake and alert and answering questions.  He is much more cooperative.  On exam, there is abrasion on the right temporal scalp.  There is no laceration.  The ear is healing nicely from the repair was.  Inspection and palpation of the zygomatic complex reveals good symmetry.  The fracture is minimally displaced and will not require surgical repair.  He has no trismus.  EOMs are completely intact.  Orbital rims are intact.  Continue with bacitracin ointment on the ear and on the scalp abrasion, and no need for outpatient follow-up with me unless new problems arise.

## 2019-02-20 NOTE — Progress Notes (Signed)
Patient ID: Gary QuinonesChristopher L Mcmeans, male   DOB: Nov 25, 1980, 38 y.o.   MRN: [id number] Follow up - Trauma Critical Care  Patient Details:    Gary QuinonesChristopher L Gingrich is an 38 y.o. male.  Lines/tubes : Airway 7.5 mm (Active)  Secured at (cm) 24 cm 02/19/2019  3:23 AM  Measured From Lips 02/19/2019  3:23 AM  Secured Location Right 02/19/2019  3:23 AM  Secured By [employer] Tube Holder 02/19/2019  3:23 AM  Tube Holder Repositioned Yes 02/19/2019  3:23 AM  Cuff Pressure (cm H2O) 28 cm H2O 02/18/2019 11:04 AM  Site Condition Dry 02/19/2019  3:23 AM     Chest Tube 1 Right;Lateral Pleural (Active)  Output (mL) 2 mL 02/19/2019  7:00 AM     Negative Pressure Wound Therapy Leg Left (Active)  Last dressing change 02/18/19 02/18/2019  3:00 PM  Site / Wound Assessment Dressing in place / Unable to assess 02/18/2019  8:00 PM  Cycle Continuous 02/18/2019  8:00 PM  Target Pressure (mmHg) 125 02/18/2019  8:00 PM  Dressing Status Intact 02/18/2019  3:00 PM  Drainage Amount None 02/18/2019  3:00 PM  Output (mL) 0 mL 02/19/2019  7:00 AM     NG/OG Tube Orogastric 16 Fr. Right mouth Xray;Aucultation (Active)  Site Assessment Clean;Dry;Intact 02/18/2019  8:00 PM  Ongoing Placement Verification No change in respiratory status;No acute changes, not attributed to clinical condition 02/18/2019  8:00 PM  Status Suction-low intermittent 02/18/2019  8:00 PM  Drainage Appearance Bloody 02/18/2019  8:00 PM  Output (mL) 150 mL 02/18/2019 10:00 PM     Urethral Catheter Lew DawesKelly Moon RN  Temperature probe 16 Fr. (Active)  Indication for Insertion or Continuance of Catheter Unstable spinal/crush injuries / Multisystem Trauma;Chemically paralyzed patients;Unstable critically ill patients first 24-48 hours (See Criteria) 02/18/2019  8:00 PM  Site Assessment Clean;Intact 02/18/2019  8:00 PM  Catheter Maintenance Bag below level of bladder;Catheter secured;Drainage bag/tubing not touching floor;Insertion date on drainage bag;No dependent loops;Seal intact;Bag emptied prior  to transport 02/18/2019  8:00 PM  Collection Container Standard drainage bag 02/18/2019  8:00 PM  Securement Method Securing device (Describe) 02/18/2019  8:00 PM  Urinary Catheter Interventions Unclamped 02/18/2019  8:00 PM  Output (mL) 200 mL 02/19/2019  6:00 AM    Microbiology/Sepsis markers: Results for orders placed or performed during the hospital encounter of 02/18/19  SARS Coronavirus 2 (CEPHEID - [facility]), Hosp Order     Status: None   Collection Time: 02/18/19  6:25 AM  Result Value Ref Range Status   SARS Coronavirus 2 NEGATIVE NEGATIVE Final    Comment: (NOTE) If result is NEGATIVE SARS-CoV-2 target nucleic acids are NOT DETECTED. The SARS-CoV-2 RNA is generally detectable in upper and lower  respiratory specimens during the acute phase of infection. The lowest  concentration of SARS-CoV-2 viral copies this assay can detect is 250  copies / mL. A negative result does not preclude SARS-CoV-2 infection  and should not be used as the sole basis for treatment or other  patient management decisions.  A negative result may occur with  improper specimen collection / handling, submission of specimen other  than nasopharyngeal swab, presence of viral mutation(s) within the  areas targeted by this assay, and inadequate number of viral copies  (<250 copies / mL). A negative result must be combined with clinical  observations, patient history, and epidemiological information. If result is POSITIVE SARS-CoV-2 target nucleic acids are DETECTED. The SARS-CoV-2 RNA is generally detectable in  upper and lower  respiratory specimens dur ing the acute phase of infection.  Positive  results are indicative of active infection with SARS-CoV-2.  Clinical  correlation with patient history and other diagnostic information is  necessary to determine patient infection status.  Positive results do  not rule out bacterial infection or co-infection with other viruses. If result  is PRESUMPTIVE POSTIVE SARS-CoV-2 nucleic acids MAY BE PRESENT.   A presumptive positive result was obtained on the submitted specimen  and confirmed on repeat testing.  While 2019 novel coronavirus  (SARS-CoV-2) nucleic acids may be present in the submitted sample  additional confirmatory testing may be necessary for epidemiological  and / or clinical management purposes  to differentiate between  SARS-CoV-2 and other Sarbecovirus currently known to infect humans.  If clinically indicated additional testing with an alternate test  methodology [phone]) is advised. The SARS-CoV-2 RNA is generally  detectable in upper and lower respiratory sp ecimens during the acute  phase of infection. The expected result is Negative. Fact Sheet for Patients:  StrictlyIdeas.no Fact Sheet for Healthcare Providers: BankingDealers.co.za This test is not yet approved or cleared by the [place] FDA and has been authorized for detection and/or diagnosis of SARS-CoV-2 by FDA under an Emergency Use Authorization (EUA).  This EUA will remain in effect (meaning this test can be used) for the duration of the COVID-19 declaration under Section 564(b)(1) of the Act, 21 U.S.C. section 360bbb-3(b)(1), unless the authorization is terminated or revoked sooner. Performed at [facility], [place] [address], [place], [place] [address]   MRSA PCR Screening     Status: None   Collection Time: 02/18/19  2:48 PM  Result Value Ref Range Status   MRSA by PCR NEGATIVE NEGATIVE Final    Comment:        The GeneXpert MRSA Assay (FDA approved for NASAL specimens only), is one component of a comprehensive MRSA colonization surveillance program. It is not intended to diagnose MRSA infection nor to guide or monitor treatment for MRSA infections. Performed at [facility], [place] [address], [place], [place] [address]     Anti-infectives:  Anti-infectives (From  admission, onward)   Start     Dose/Rate Route Frequency Ordered Stop   02/19/19 0600  ceFAZolin (ANCEF) IVPB 2g/100 mL premix     2 g 200 mL/hr over 30 Minutes Intravenous On call to O.R. 02/18/19 1231 02/18/19 1324   02/18/19 2100  ceFAZolin (ANCEF) IVPB 2g/100 mL premix     2 g 200 mL/hr over 30 Minutes Intravenous Every 8 hours 02/18/19 1447 02/19/19 1442      Best Practice/Protocols:  VTE Prophylaxis: Mechanical GI Prophylaxis: Proton Pump Inhibitor Continous Sedation  Consults: Treatment Team:  Shona Needles, MD Eustace Moore, MD    Studies:    Events:  Subjective:    Overnight Issues:  Extubated yesterday  Objective:  Vital signs for last 24 hours: Temp:  [97.9 F (36.6 C)-101.1 F (38.4 C)] 97.9 F (36.6 C) (06/07 0800) Pulse Rate:  [62-116] 86 (06/07 0800) Resp:  [13-51] 19 (06/07 0800) BP: (134-171)/(77-104) 154/100 (06/07 0800) SpO2:  [93 %-100 %] 96 % (06/07 0800)  Hemodynamic parameters for last 24 hours:    Intake/Output from previous day: 06/06 0701 - 06/07 0700 In: 2492.2 [I.V.:2492.2] Out: 1756 [Urine:1725; Chest Tube:31]  Intake/Output this shift: Total I/O In: 99.9 [I.V.:99.9] Out: -   Vent settings for last 24 hours:    Physical Exam:  General:  Awake Neuro: Alert and cooperative, answering questions appropriately; FC x4 HEENT/Neck: collar in place Resp: clear to auscultation bilaterally and Right CT - no air leak; no wheezes CVS: regular rate and rhythm, S1, S2 normal, no murmur, click, rub or gallop GI: soft, nontender, BS WNL, no r/g Skin: no rash, trace edema in b/l LE Extremities: ex fix RLE, trace edema; good cap refill; LLE - kerlix/ace wrap; no edema, good cap refill  Results for orders placed or performed during the hospital encounter of 02/18/19 (from the past 24 hour(s))  Triglycerides     Status: None   Collection Time: 02/20/19  7:34 AM  Result Value Ref Range   Triglycerides 51 <150 mg/dL  CBC     Status:  Abnormal   Collection Time: 02/20/19  7:34 AM  Result Value Ref Range   WBC 6.9 4.0 - 10.5 K/uL   RBC 2.71 (L) 4.22 - 5.81 MIL/uL   Hemoglobin 8.6 (L) 13.0 - 17.0 g/dL   HCT 52.825.8 (L) 41.339.0 - 24.452.0 %   MCV 95.2 80.0 - 100.0 fL   MCH 31.7 26.0 - 34.0 pg   MCHC 33.3 30.0 - 36.0 g/dL   RDW 01.013.9 27.211.5 - 53.615.5 %   Platelets 93 (L) 150 - 400 K/uL   nRBC 0.0 0.0 - 0.2 %  Basic metabolic panel     Status: Abnormal   Collection Time: 02/20/19  7:34 AM  Result Value Ref Range   Sodium 138 135 - 145 mmol/L   Potassium 4.3 3.5 - 5.1 mmol/L   Chloride 100 98 - 111 mmol/L   CO2 30 22 - 32 mmol/L   Glucose, Bld 118 (H) 70 - 99 mg/dL   BUN 5 (L) 6 - 20 mg/dL   Creatinine, Ser 6.440.81 0.61 - 1.24 mg/dL   Calcium 8.9 8.9 - 03.410.3 mg/dL   GFR calc non Af Amer >60 >60 mL/min   GFR calc Af Amer >60 >60 mL/min   Anion gap 8 5 - 15    Assessment & Plan: Present on Admission: PHBC 1. Small R SAH 2. Epidural hematoma 3. Moderate pneumocephalus 4. Right tripod fx with zygoma depression - Dr. Pollyann Kennedyosen, no surgical intervention needed, no follow-up needed unless problems arise 5. Right orbital roof fx extending into sphenoid sinuses 6. Avulsion fx TP at C7 7. Bilateral 1st & 2nd rib fxs 8. Biapical occult pneumothorax 9. Right bicondylar tibial plateau 10. L knee laceration  Pulm -extubated yesterday, doing well Rt PTX - CT placed 6/5; still w small ptx and some subcu air, no air leak will waterseal today, check CXR tomorrow Neuro - repeat head CT 6/6 - stable, Dr Yetta BarreJones CV - HD stable, monitor for HTN GI - advance diet as tolerated Renal - good uop, Cr ok.  Remove Foley today FEN - maint IVF, lytes ok ID - low grade temp, prob atelectasis; WBC normal, Ancef x 3 doses per ortho ABL anemia - repeat cbc in am VTE prophylaxis - foot pumps, no chemical VTE prophaly yet per NSG and with falling hgb Ortho injuries - Closed reduction of right bicondylar tibial plateau fracture; External fixation right tibial  plateau fracture; Irrigation and debridement of left knee arthrotomy; Irrigation and debridement and closure of left knee laceration, size 10 cm; Incisional wound vac placement - Dr Jena GaussHaddix 6/5; plan repeat next week  Dispo - will transfer to [facility]   LOS: 2 days       Berna Buehelsea A Dayln Tugwell  MD, FACS General, Bariatric, &  Minimally Invasive Surgery [facility] [place]   02/20/2019  *Care during the described time interval was provided by me. I have reviewed this patient's available data, including medical history, events of note, physical examination and test results as part of my evaluation.
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
## 2019-02-21 ENCOUNTER — Inpatient Hospital Stay (HOSPITAL_COMMUNITY): Payer: Medicaid Other

## 2019-02-21 ENCOUNTER — Encounter (HOSPITAL_COMMUNITY): Admission: EM | Disposition: A | Discharge status: Rehabilitation Facility | Payer: Self-pay | Source: Home / Self Care

## 2019-02-21 ENCOUNTER — Encounter (HOSPITAL_COMMUNITY): Payer: Self-pay

## 2019-02-21 ENCOUNTER — Other Ambulatory Visit: Payer: Self-pay

## 2019-02-21 ENCOUNTER — Inpatient Hospital Stay (HOSPITAL_COMMUNITY): Payer: Medicaid Other | Admitting: Certified Registered Nurse Anesthetist

## 2019-02-21 HISTORY — PX: ORIF TIBIA PLATEAU: SHX2132

## 2019-02-21 LAB — CBC
HCT: 23.4 % — ABNORMAL LOW (ref 39.0–52.0)
Hemoglobin: 7.8 g/dL — ABNORMAL LOW (ref 13.0–17.0)
MCH: 31.8 pg (ref 26.0–34.0)
MCHC: 33.3 g/dL (ref 30.0–36.0)
MCV: 95.5 fL (ref 80.0–100.0)
Platelets: 132 10*3/uL — ABNORMAL LOW (ref 150–400)
RBC: 2.45 MIL/uL — ABNORMAL LOW (ref 4.22–5.81)
RDW: 13.2 % (ref 11.5–15.5)
WBC: 7.2 10*3/uL (ref 4.0–10.5)
nRBC: 0 % (ref 0.0–0.2)

## 2019-02-21 LAB — BASIC METABOLIC PANEL
Anion gap: 10 (ref 5–15)
BUN: 7 mg/dL (ref 6–20)
CO2: 31 mmol/L (ref 22–32)
Calcium: 8.8 mg/dL — ABNORMAL LOW (ref 8.9–10.3)
Chloride: 95 mmol/L — ABNORMAL LOW (ref 98–111)
Creatinine, Ser: 0.87 mg/dL (ref 0.61–1.24)
GFR calc Af Amer: >60 mL/min (ref >60)
GFR calc non Af Amer: >60 mL/min (ref >60)
Glucose, Bld: 153 mg/dL — ABNORMAL HIGH (ref 70–99)
Potassium: 3.9 mmol/L (ref 3.5–5.1)
Sodium: 136 mmol/L (ref 135–145)

## 2019-02-21 LAB — TRIGLYCERIDES: Triglycerides: 72 mg/dL

## 2019-02-21 LAB — MAGNESIUM: Magnesium: 2 mg/dL (ref 1.7–2.4)

## 2019-02-21 SURGERY — OPEN REDUCTION INTERNAL FIXATION (ORIF) TIBIAL PLATEAU
Anesthesia: General | Laterality: Right

## 2019-02-21 MED ORDER — TOBRAMYCIN SULFATE 1.2 G IJ SOLR
INTRAMUSCULAR | Status: AC
  Filled 2019-02-21: qty 1.2

## 2019-02-21 MED ORDER — LIDOCAINE 2% (20 MG/ML) 5 ML SYRINGE
INTRAMUSCULAR | Status: AC
  Filled 2019-02-21: qty 5

## 2019-02-21 MED ORDER — MIDAZOLAM HCL 2 MG/2ML IJ SOLN
INTRAMUSCULAR | Status: AC
  Filled 2019-02-21: qty 2

## 2019-02-21 MED ORDER — OXYCODONE HCL 5 MG/5ML PO SOLN: 5.0000 mg | Freq: Once | ORAL | Status: DC | PRN

## 2019-02-21 MED ORDER — OXYCODONE HCL 5 MG PO TABS: 5.0000 mg | ORAL_TABLET | Freq: Once | ORAL | Status: DC | PRN

## 2019-02-21 MED ORDER — METHOCARBAMOL 500 MG PO TABS
500.0000 mg | ORAL_TABLET | Freq: Four times a day (QID) | ORAL | Status: DC | PRN
  Administered 2019-02-21 – 2019-02-22 (×4): 500 mg via ORAL
  Filled 2019-02-21 (×4): qty 1

## 2019-02-21 MED ORDER — KETAMINE HCL 10 MG/ML IJ SOLN
INTRAMUSCULAR | Status: DC | PRN
  Administered 2019-02-21: 10 mg via INTRAVENOUS
  Administered 2019-02-21: 30 mg via INTRAVENOUS
  Administered 2019-02-21: 10 mg via INTRAVENOUS

## 2019-02-21 MED ORDER — ESMOLOL HCL 100 MG/10ML IV SOLN
INTRAVENOUS | Status: DC | PRN
  Administered 2019-02-21 (×2): 30 mg via INTRAVENOUS

## 2019-02-21 MED ORDER — SUCCINYLCHOLINE CHLORIDE 200 MG/10ML IV SOSY
PREFILLED_SYRINGE | INTRAVENOUS | Status: DC | PRN
  Administered 2019-02-21: 80 mg via INTRAVENOUS

## 2019-02-21 MED ORDER — CEFAZOLIN SODIUM-DEXTROSE 2-3 GM-%(50ML) IV SOLR
INTRAVENOUS | Status: DC | PRN
  Administered 2019-02-21: 2 g via INTRAVENOUS

## 2019-02-21 MED ORDER — ACETAMINOPHEN 500 MG PO TABS: 1000.0000 mg | ORAL_TABLET | Freq: Once | ORAL | Status: DC | PRN

## 2019-02-21 MED ORDER — DEXAMETHASONE SODIUM PHOSPHATE 10 MG/ML IJ SOLN
INTRAMUSCULAR | Status: DC | PRN
  Administered 2019-02-21: 5 mg via INTRAVENOUS

## 2019-02-21 MED ORDER — CEFAZOLIN SODIUM-DEXTROSE 2-4 GM/100ML-% IV SOLN
2.0000 g | Freq: Three times a day (TID) | INTRAVENOUS | Status: AC
  Administered 2019-02-21 – 2019-02-22 (×3): 2 g via INTRAVENOUS
  Filled 2019-02-21 (×3): qty 100

## 2019-02-21 MED ORDER — VANCOMYCIN HCL 1000 MG IV SOLR
INTRAVENOUS | Status: DC | PRN
  Administered 2019-02-21: 1000 mg

## 2019-02-21 MED ORDER — GABAPENTIN 100 MG PO CAPS
100.0000 mg | ORAL_CAPSULE | Freq: Three times a day (TID) | ORAL | Status: DC
  Administered 2019-02-21 – 2019-02-24 (×11): 100 mg via ORAL
  Filled 2019-02-21 (×12): qty 1

## 2019-02-21 MED ORDER — FENTANYL CITRATE (PF) 100 MCG/2ML IJ SOLN: 25.0000 ug | INTRAMUSCULAR | Status: DC | PRN

## 2019-02-21 MED ORDER — ACETAMINOPHEN 325 MG PO TABS
650.0000 mg | ORAL_TABLET | Freq: Four times a day (QID) | ORAL | Status: DC
  Administered 2019-02-21 – 2019-03-02 (×36): 650 mg via ORAL
  Filled 2019-02-21 (×36): qty 2

## 2019-02-21 MED ORDER — ACETAMINOPHEN 160 MG/5ML PO SOLN: 1000.0000 mg | Freq: Once | ORAL | Status: DC | PRN

## 2019-02-21 MED ORDER — TOBRAMYCIN POWD
Status: DC | PRN
  Administered 2019-02-21: 1200 mg

## 2019-02-21 MED ORDER — LIDOCAINE 2% (20 MG/ML) 5 ML SYRINGE
INTRAMUSCULAR | Status: DC | PRN
  Administered 2019-02-21: 60 mg via INTRAVENOUS

## 2019-02-21 MED ORDER — CALCIUM CHLORIDE 10 % IV SOLN
INTRAVENOUS | Status: AC
  Filled 2019-02-21: qty 10

## 2019-02-21 MED ORDER — KETAMINE HCL 50 MG/5ML IJ SOSY
PREFILLED_SYRINGE | INTRAMUSCULAR | Status: AC
  Filled 2019-02-21: qty 5

## 2019-02-21 MED ORDER — PROPOFOL 10 MG/ML IV BOLUS
INTRAVENOUS | Status: DC | PRN
  Administered 2019-02-21: 110 mg via INTRAVENOUS

## 2019-02-21 MED ORDER — SUGAMMADEX SODIUM 200 MG/2ML IV SOLN
INTRAVENOUS | Status: DC | PRN
  Administered 2019-02-21: 200 mg via INTRAVENOUS

## 2019-02-21 MED ORDER — FENTANYL CITRATE (PF) 250 MCG/5ML IJ SOLN
INTRAMUSCULAR | Status: AC
  Filled 2019-02-21: qty 5

## 2019-02-21 MED ORDER — FENTANYL CITRATE (PF) 250 MCG/5ML IJ SOLN
INTRAMUSCULAR | Status: DC | PRN
  Administered 2019-02-21: 50 ug via INTRAVENOUS
  Administered 2019-02-21: 100 ug via INTRAVENOUS
  Administered 2019-02-21: 150 ug via INTRAVENOUS

## 2019-02-21 MED ORDER — BACITRACIN ZINC 500 UNIT/GM EX OINT
TOPICAL_OINTMENT | CUTANEOUS | Status: AC
  Filled 2019-02-21: qty 28.35

## 2019-02-21 MED ORDER — ROCURONIUM BROMIDE 10 MG/ML (PF) SYRINGE
PREFILLED_SYRINGE | INTRAVENOUS | Status: DC | PRN
  Administered 2019-02-21 (×2): 30 mg via INTRAVENOUS
  Administered 2019-02-21: 60 mg via INTRAVENOUS

## 2019-02-21 MED ORDER — DEXAMETHASONE SODIUM PHOSPHATE 10 MG/ML IJ SOLN
INTRAMUSCULAR | Status: AC
  Filled 2019-02-21: qty 1

## 2019-02-21 MED ORDER — MIDAZOLAM HCL 2 MG/2ML IJ SOLN
INTRAMUSCULAR | Status: DC | PRN
  Administered 2019-02-21: 2 mg via INTRAVENOUS

## 2019-02-21 MED ORDER — ESMOLOL HCL 100 MG/10ML IV SOLN
INTRAVENOUS | Status: AC
  Filled 2019-02-21: qty 10

## 2019-02-21 MED ORDER — ONDANSETRON HCL 4 MG/2ML IJ SOLN
INTRAMUSCULAR | Status: AC
  Filled 2019-02-21: qty 2

## 2019-02-21 MED ORDER — ACETAMINOPHEN 325 MG PO TABS
650.0000 mg | ORAL_TABLET | Freq: Four times a day (QID) | ORAL | Status: DC
  Administered 2019-02-21: 15:00:00 650 mg via ORAL
  Filled 2019-02-21: qty 2

## 2019-02-21 MED ORDER — SODIUM BICARBONATE 8.4 % IV SOLN
INTRAVENOUS | Status: AC
  Filled 2019-02-21: qty 50

## 2019-02-21 MED ORDER — 0.9 % SODIUM CHLORIDE (POUR BTL) OPTIME
TOPICAL | Status: DC | PRN
  Administered 2019-02-21: 1000 mL

## 2019-02-21 MED ORDER — VANCOMYCIN HCL 1000 MG IV SOLR
INTRAVENOUS | Status: AC
  Filled 2019-02-21: qty 1000

## 2019-02-21 MED ORDER — ACETAMINOPHEN 10 MG/ML IV SOLN: 1000.0000 mg | Freq: Once | INTRAVENOUS | Status: DC | PRN

## 2019-02-21 MED ORDER — VITAMIN D 25 MCG (1000 UNIT) PO TABS
4000.0000 [IU] | ORAL_TABLET | Freq: Every day | ORAL | Status: DC
  Administered 2019-02-22 – 2019-03-02 (×9): 4000 [IU] via ORAL
  Filled 2019-02-21 (×9): qty 4

## 2019-02-21 MED ORDER — PROPOFOL 10 MG/ML IV BOLUS
INTRAVENOUS | Status: AC
  Filled 2019-02-21: qty 20

## 2019-02-21 MED FILL — Tobramycin Sulfate For Inj 1.2 GM: INTRAMUSCULAR | Qty: 1.2 | Status: AC

## 2019-02-21 SURGICAL SUPPLY — 75 items
BANDAGE ACE 4X5 VEL STRL LF (GAUZE/BANDAGES/DRESSINGS) ×2 IMPLANT
BANDAGE ACE 6X5 VEL STRL LF (GAUZE/BANDAGES/DRESSINGS) ×2 IMPLANT
BANDAGE ESMARK 6X9 LF (GAUZE/BANDAGES/DRESSINGS) ×1 IMPLANT
BIT DRILL CALIBR QC 2.8X250 (BIT) ×2 IMPLANT
BLADE CLIPPER SURG (BLADE) IMPLANT
BLADE SURG 15 STRL LF DISP TIS (BLADE) ×1 IMPLANT
BLADE SURG 15 STRL SS (BLADE) ×1
BNDG ELASTIC 6X10 VLCR STRL LF (GAUZE/BANDAGES/DRESSINGS) ×2 IMPLANT
BNDG ESMARK 6X9 LF (GAUZE/BANDAGES/DRESSINGS) ×2
BNDG GAUZE ELAST 4 BULKY (GAUZE/BANDAGES/DRESSINGS) ×2 IMPLANT
BRUSH SCRUB SURG 4.25 DISP (MISCELLANEOUS) ×4 IMPLANT
CANISTER SUCT 3000ML PPV (MISCELLANEOUS) ×2 IMPLANT
CHLORAPREP W/TINT 26ML (MISCELLANEOUS) ×4 IMPLANT
COVER SURGICAL LIGHT HANDLE (MISCELLANEOUS) ×2 IMPLANT
COVER WAND RF STERILE (DRAPES) ×2 IMPLANT
CUFF TOURNIQUET SINGLE 34IN LL (TOURNIQUET CUFF) ×2 IMPLANT
DRAPE C-ARM 42X72 X-RAY (DRAPES) ×2 IMPLANT
DRAPE C-ARMOR (DRAPES) ×2 IMPLANT
DRAPE ORTHO SPLIT 77X108 STRL (DRAPES) ×2
DRAPE SURG ORHT 6 SPLT 77X108 (DRAPES) ×2 IMPLANT
DRAPE U-SHAPE 47X51 STRL (DRAPES) ×2 IMPLANT
DRSG ADAPTIC 3X8 NADH LF (GAUZE/BANDAGES/DRESSINGS) ×2 IMPLANT
DRSG PAD ABDOMINAL 8X10 ST (GAUZE/BANDAGES/DRESSINGS) ×4 IMPLANT
ELECT REM PT RETURN 9FT ADLT (ELECTROSURGICAL) ×2
ELECTRODE REM PT RTRN 9FT ADLT (ELECTROSURGICAL) ×1 IMPLANT
GAUZE SPONGE 4X4 12PLY STRL (GAUZE/BANDAGES/DRESSINGS) ×2 IMPLANT
GAUZE SPONGE 4X4 12PLY STRL LF (GAUZE/BANDAGES/DRESSINGS) ×2 IMPLANT
GLOVE BIO SURGEON STRL SZ 6.5 (GLOVE) ×8 IMPLANT
GLOVE BIO SURGEON STRL SZ7.5 (GLOVE) ×8 IMPLANT
GLOVE BIOGEL PI IND STRL 6.5 (GLOVE) ×1 IMPLANT
GLOVE BIOGEL PI IND STRL 7.0 (GLOVE) ×1 IMPLANT
GLOVE BIOGEL PI IND STRL 7.5 (GLOVE) ×1 IMPLANT
GLOVE BIOGEL PI INDICATOR 6.5 (GLOVE) ×1
GLOVE BIOGEL PI INDICATOR 7.0 (GLOVE) ×1
GLOVE BIOGEL PI INDICATOR 7.5 (GLOVE) ×1
GOWN STRL REUS W/ TWL LRG LVL3 (GOWN DISPOSABLE) ×5 IMPLANT
GOWN STRL REUS W/TWL LRG LVL3 (GOWN DISPOSABLE) ×5
IMMOBILIZER KNEE 22 UNIV (SOFTGOODS) ×2 IMPLANT
KIT BASIN OR (CUSTOM PROCEDURE TRAY) ×2 IMPLANT
KIT TURNOVER KIT B (KITS) ×2 IMPLANT
NDL SUT 6 .5 CRC .975X.05 MAYO (NEEDLE) ×1 IMPLANT
NEEDLE MAYO TAPER (NEEDLE) ×1
NS IRRIG 1000ML POUR BTL (IV SOLUTION) ×2 IMPLANT
PACK TOTAL JOINT (CUSTOM PROCEDURE TRAY) ×2 IMPLANT
PAD ABD 8X10 STRL (GAUZE/BANDAGES/DRESSINGS) ×2 IMPLANT
PAD ARMBOARD 7.5X6 YLW CONV (MISCELLANEOUS) ×4 IMPLANT
PAD CAST 4YDX4 CTTN HI CHSV (CAST SUPPLIES) ×1 IMPLANT
PADDING CAST COTTON 4X4 STRL (CAST SUPPLIES) ×1
PADDING CAST COTTON 6X4 STRL (CAST SUPPLIES) ×2 IMPLANT
PLATE 10H PROX TIBIA 3.5X177 (Plate) ×2 IMPLANT
SCREW CORT HEADED ST 3.5X44 (Screw) ×2 IMPLANT
SCREW HEADED ST 3.5X48 (Screw) ×2 IMPLANT
SCREW HEADED ST 3.5X60 (Screw) ×2 IMPLANT
SCREW HEADED ST 3.5X64 (Screw) ×2 IMPLANT
SCREW HEADED ST 3.5X80 (Screw) ×2 IMPLANT
SCREW LOCKING 3.5X80MM VA (Screw) ×4 IMPLANT
SCREW LOCKING VA 3.5X75MM (Screw) ×2 IMPLANT
STAPLER VISISTAT 35W (STAPLE) ×2 IMPLANT
SUCTION FRAZIER HANDLE 10FR (MISCELLANEOUS) ×1
SUCTION TUBE FRAZIER 10FR DISP (MISCELLANEOUS) ×1 IMPLANT
SUT ETHILON 2 0 FS 18 (SUTURE) ×2 IMPLANT
SUT ETHILON 3 0 PS 1 (SUTURE) ×8 IMPLANT
SUT FIBERWIRE #2 38 T-5 BLUE (SUTURE)
SUT VIC AB 0 CT1 27 (SUTURE)
SUT VIC AB 0 CT1 27XBRD ANBCTR (SUTURE) IMPLANT
SUT VIC AB 1 CT1 18XCR BRD 8 (SUTURE) IMPLANT
SUT VIC AB 1 CT1 27 (SUTURE) ×1
SUT VIC AB 1 CT1 27XBRD ANBCTR (SUTURE) ×1 IMPLANT
SUT VIC AB 1 CT1 8-18 (SUTURE)
SUT VIC AB 2-0 CT1 27 (SUTURE) ×2
SUT VIC AB 2-0 CT1 TAPERPNT 27 (SUTURE) ×2 IMPLANT
SUTURE FIBERWR #2 38 T-5 BLUE (SUTURE) IMPLANT
TOWEL OR 17X26 10 PK STRL BLUE (TOWEL DISPOSABLE) ×4 IMPLANT
TRAY FOLEY MTR SLVR 16FR STAT (SET/KITS/TRAYS/PACK) IMPLANT
WATER STERILE IRR 1000ML POUR (IV SOLUTION) ×4 IMPLANT
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
## 2019-02-21 NOTE — Progress Notes (Signed)
Ortho Trauma Progress Note  Doing well this morning.  Pain controlled.  Ex-fix is in place.  Compartments are soft compressible.  Skin wrinkles safe to proceed with ORIF.  Risks and benefits were discussed with the patient.  Risks included but not limited to bleeding, infection, malunion, nonunion, posttraumatic arthritis, knee stiffness, DVT, nerve and blood vessel injury.  We will also change his wound VAC on his left lower extremity in the operating room.  All questions answered patient agrees to proceed.  Shona Needles, MD [facility] [phone] (phone) [phone] (office) [email or web address]

## 2019-02-21 NOTE — Anesthesia Procedure Notes (Signed)
Procedure Name: Intubation Date/Time: 02/21/2019 9:50 AM Performed by: Elayne Snare, CRNA Pre-anesthesia Checklist: Patient identified, Emergency Drugs available, Suction available and Patient being monitored Patient Re-evaluated:Patient Re-evaluated prior to induction Oxygen Delivery Method: Circle system utilized Preoxygenation: Pre-oxygenation with 100% oxygen Induction Type: IV induction and Rapid sequence Laryngoscope Size: Glidescope and 4 (elective glidescope due to c-collar) Grade View: Grade I Tube type: Oral Tube size: 7.5 mm Number of attempts: 1 Airway Equipment and Method: Stylet and Video-laryngoscopy Placement Confirmation: ETT inserted through vocal cords under direct vision,  CO2 detector and breath sounds checked- equal and bilateral Secured at: 23 cm Tube secured with: Tape Dental Injury: Teeth and Oropharynx as per pre-operative assessment

## 2019-02-21 NOTE — Transfer of Care (Signed)
Immediate Anesthesia Transfer of Care Note  Patient: Gary Ashley  Procedure(s) Performed: OPEN REDUCTION INTERNAL FIXATION (ORIF) TIBIAL PLATEAU (Right )  Patient Location: PACU  Anesthesia Type:General  Level of Consciousness: patient cooperative and responds to stimulation  Airway & Oxygen Therapy: Patient Spontanous Breathing and Patient connected to nasal cannula oxygen  Post-op Assessment: Report given to RN and Post -op Vital signs reviewed and stable  Post vital signs: Reviewed and stable  Last Vitals:  Vitals Value Taken Time  BP 161/102 02/21/2019 12:40 PM  Temp    Pulse 105 02/21/2019 12:40 PM  Resp 26 02/21/2019 12:40 PM  SpO2 94 % 02/21/2019 12:40 PM  Vitals shown include unvalidated device data.  Last Pain:  Vitals:   02/21/19 1240  TempSrc:   PainSc: 0-No pain         Complications: No apparent anesthesia complications

## 2019-02-21 NOTE — Anesthesia Preprocedure Evaluation (Signed)
Anesthesia Evaluation  Patient identified by MRN, date of birth, ID band Patient awake    Reviewed: Allergy & Precautions, NPO status , [name] Chart, lab work & pertinent test results  History of Anesthesia Complications Negative for: history of anesthetic complications  Airway Mallampati: IV  TM Distance: >3 FB Neck ROM: Limited  Mouth opening: Limited Mouth Opening  Dental  (+) Dental Advisory Given   Pulmonary neg recent URI, Current Smoker,    breath sounds clear to auscultation       Cardiovascular negative cardio ROS   Rhythm:Regular     Neuro/Psych negative neurological ROS  negative psych ROS   GI/Hepatic negative GI ROS, Neg liver ROS,   Endo/Other    Renal/GU negative Renal ROS     Musculoskeletal Right bicondylar tibial plateau fracture   Abdominal   Peds  Hematology negative hematology ROS (+)   Anesthesia Other Findings   Reproductive/Obstetrics                             Anesthesia Physical Anesthesia Plan  ASA: II  Anesthesia Plan: General   Post-op Pain Management:    Induction: Intravenous  PONV Risk Score and Plan: 1 and Treatment may vary due to age or medical condition, Ondansetron and Dexamethasone  Airway Management Planned: Oral ETT and Video Laryngoscope Planned  Additional Equipment: None  Intra-op Plan:   Post-operative Plan: Extubation in OR  Informed Consent: I have reviewed the patients History and Physical, chart, labs and discussed the procedure including the risks, benefits and alternatives for the proposed anesthesia with the patient or authorized representative who has indicated his/her understanding and acceptance.     Dental advisory given  Plan Discussed with: CRNA and Surgeon  Anesthesia Plan Comments:         Anesthesia Quick Evaluation
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
## 2019-02-21 NOTE — Progress Notes (Signed)
Patient returned from OR.  Patient is alert.  Family updated via telephone. RN to continue to monitor.
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
## 2019-02-22 ENCOUNTER — Inpatient Hospital Stay (HOSPITAL_COMMUNITY): Payer: Medicaid Other

## 2019-02-22 ENCOUNTER — Encounter (HOSPITAL_COMMUNITY): Payer: Self-pay

## 2019-02-22 LAB — BASIC METABOLIC PANEL
Anion gap: 10 (ref 5–15)
BUN: 6 mg/dL (ref 6–20)
CO2: 32 mmol/L (ref 22–32)
Calcium: 8.6 mg/dL — ABNORMAL LOW (ref 8.9–10.3)
Chloride: 94 mmol/L — ABNORMAL LOW (ref 98–111)
Creatinine, Ser: 0.96 mg/dL (ref 0.61–1.24)
GFR calc Af Amer: >60 mL/min (ref >60)
GFR calc non Af Amer: >60 mL/min (ref >60)
Glucose, Bld: 155 mg/dL — ABNORMAL HIGH (ref 70–99)
Potassium: 3.3 mmol/L — ABNORMAL LOW (ref 3.5–5.1)
Sodium: 136 mmol/L (ref 135–145)

## 2019-02-22 LAB — CBC
HCT: 19.9 % — ABNORMAL LOW (ref 39.0–52.0)
HCT: 29 % — ABNORMAL LOW (ref 39.0–52.0)
Hemoglobin: 6.8 g/dL — CL (ref 13.0–17.0)
Hemoglobin: 9.7 g/dL — ABNORMAL LOW (ref 13.0–17.0)
MCH: 32.2 pg (ref 26.0–34.0)
MCH: 30.8 pg (ref 26.0–34.0)
MCHC: 34.2 g/dL (ref 30.0–36.0)
MCHC: 33.4 g/dL (ref 30.0–36.0)
MCV: 94.3 fL (ref 80.0–100.0)
MCV: 92.1 fL (ref 80.0–100.0)
Platelets: 126 10*3/uL — ABNORMAL LOW (ref 150–400)
Platelets: 146 10*3/uL — ABNORMAL LOW (ref 150–400)
RBC: 2.11 MIL/uL — ABNORMAL LOW (ref 4.22–5.81)
RBC: 3.15 MIL/uL — ABNORMAL LOW (ref 4.22–5.81)
RDW: 13 % (ref 11.5–15.5)
RDW: 14.5 % (ref 11.5–15.5)
WBC: 6.3 10*3/uL (ref 4.0–10.5)
WBC: 5.3 10*3/uL (ref 4.0–10.5)
nRBC: 0 % (ref 0.0–0.2)
nRBC: 0 % (ref 0.0–0.2)

## 2019-02-22 LAB — TRIGLYCERIDES: Triglycerides: 58 mg/dL (ref <150)

## 2019-02-22 LAB — PREPARE RBC (CROSSMATCH)

## 2019-02-22 LAB — VITAMIN D 25 HYDROXY (VIT D DEFICIENCY, FRACTURES): Vit D, 25-Hydroxy: <4 ng/mL — ABNORMAL LOW (ref 30.0–100.0)

## 2019-02-22 MED ORDER — VITAMIN D (ERGOCALCIFEROL) 1.25 MG (50000 UNIT) PO CAPS
50000.0000 [IU] | ORAL_CAPSULE | ORAL | Status: DC
  Filled 2019-02-22: qty 1

## 2019-02-22 MED ORDER — POTASSIUM CHLORIDE CRYS ER 20 MEQ PO TBCR
40.0000 meq | EXTENDED_RELEASE_TABLET | Freq: Once | ORAL | Status: AC
  Administered 2019-02-22: 40 meq via ORAL
  Filled 2019-02-22: qty 2

## 2019-02-22 MED ORDER — SODIUM CHLORIDE 0.9% IV SOLUTION: Freq: Once | INTRAVENOUS | Status: DC

## 2019-02-22 MED ORDER — VITAMIN D (ERGOCALCIFEROL) 1.25 MG (50000 UNIT) PO CAPS
50000.0000 [IU] | ORAL_CAPSULE | ORAL | Status: DC
  Administered 2019-02-22 – 2019-03-01 (×2): 50000 [IU] via ORAL
  Filled 2019-02-22 (×2): qty 1

## 2019-02-22 MED ORDER — TRAMADOL HCL 50 MG PO TABS
50.0000 mg | ORAL_TABLET | Freq: Four times a day (QID) | ORAL | Status: DC
  Administered 2019-02-22 – 2019-02-27 (×21): 50 mg via ORAL
  Filled 2019-02-22 (×21): qty 1
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
## 2019-02-22 NOTE — Progress Notes (Signed)
CRITICAL VALUE ALERT  Critical Value:  Hemoglobin 6.8  Date & Time Notied:  02/22/2019  0800  Provider Notified: Dr Grandville Silos  Orders Received/Actions taken: Awaiting orders

## 2019-02-22 NOTE — Progress Notes (Signed)
Patient ID: Gary Ashley, male   DOB: 09-Mar-1981, 38 y.o.   MRN: [id number] Patient is awake and alert and conversant.  Denies headache.  States he is doing well.  No numbness tingling or weakness or visual changes.  Following.
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
## 2019-02-22 NOTE — Progress Notes (Signed)
Patient arrived to unit. Vitals and assessment done. Introduction to the room. Patient had bag with cell phone, 3 ID cards, and $50.37 in security envelope. Patient asked [place] to put it in the bedside table.
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
## 2019-02-22 NOTE — Progress Notes (Signed)
Rehab Admissions Coordinator Note:  Patient was screened by Michel Santee for appropriateness for an Inpatient Acute Rehab Consult.  At this time, we are recommending Inpatient Rehab consult. I will contact MD for order.   Michel Santee 02/22/2019, 2:44 PM  I can be reached at [phone].
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
## 2019-02-23 ENCOUNTER — Encounter (HOSPITAL_COMMUNITY): Payer: Self-pay | Admitting: Physical Medicine and Rehabilitation

## 2019-02-23 ENCOUNTER — Inpatient Hospital Stay (HOSPITAL_COMMUNITY): Payer: Medicaid Other

## 2019-02-23 DIAGNOSIS — S02101A Fracture of base of skull, right side, initial encounter for closed fracture: Secondary | ICD-10-CM

## 2019-02-23 DIAGNOSIS — S064X9A Epidural hemorrhage with loss of consciousness of unspecified duration, initial encounter: Secondary | ICD-10-CM

## 2019-02-23 DIAGNOSIS — S82141A Displaced bicondylar fracture of right tibia, initial encounter for closed fracture: Principal | ICD-10-CM

## 2019-02-23 LAB — CBC
HCT: 25.6 % — ABNORMAL LOW (ref 39.0–52.0)
Hemoglobin: 8.8 g/dL — ABNORMAL LOW (ref 13.0–17.0)
MCH: 31.4 pg (ref 26.0–34.0)
MCHC: 34.4 g/dL (ref 30.0–36.0)
MCV: 91.4 fL (ref 80.0–100.0)
Platelets: 157 10*3/uL (ref 150–400)
RBC: 2.8 MIL/uL — ABNORMAL LOW (ref 4.22–5.81)
RDW: 14.6 % (ref 11.5–15.5)
WBC: 6.3 10*3/uL (ref 4.0–10.5)
nRBC: 0 % (ref 0.0–0.2)

## 2019-02-23 LAB — TYPE AND SCREEN
ABO/RH(D): B POS
Antibody Screen: NEGATIVE
Unit division: 0

## 2019-02-23 LAB — BASIC METABOLIC PANEL
Anion gap: 6 (ref 5–15)
BUN: 7 mg/dL (ref 6–20)
CO2: 33 mmol/L — ABNORMAL HIGH (ref 22–32)
Calcium: 8.8 mg/dL — ABNORMAL LOW (ref 8.9–10.3)
Chloride: 96 mmol/L — ABNORMAL LOW (ref 98–111)
Creatinine, Ser: 0.75 mg/dL (ref 0.61–1.24)
GFR calc Af Amer: >60 mL/min (ref >60)
GFR calc non Af Amer: >60 mL/min (ref >60)
Glucose, Bld: 124 mg/dL — ABNORMAL HIGH (ref 70–99)
Potassium: 3.9 mmol/L (ref 3.5–5.1)
Sodium: 135 mmol/L (ref 135–145)

## 2019-02-23 LAB — BPAM RBC
Blood Product Expiration Date: 202006262359
ISSUE DATE / TIME: 202006091031
Unit Type and Rh: 7300

## 2019-02-23 LAB — TRIGLYCERIDES: Triglycerides: 58 mg/dL (ref <150)

## 2019-02-23 MED ORDER — ENSURE ENLIVE PO LIQD
237.0000 mL | Freq: Two times a day (BID) | ORAL | Status: DC
  Administered 2019-02-23 – 2019-03-02 (×15): 237 mL via ORAL

## 2019-02-23 MED ORDER — METOPROLOL TARTRATE 12.5 MG HALF TABLET
12.5000 mg | ORAL_TABLET | Freq: Two times a day (BID) | ORAL | Status: DC
  Administered 2019-02-23 – 2019-03-02 (×15): 12.5 mg via ORAL
  Filled 2019-02-23 (×15): qty 1

## 2019-02-23 MED ORDER — METHOCARBAMOL 750 MG PO TABS
750.0000 mg | ORAL_TABLET | Freq: Four times a day (QID) | ORAL | Status: DC
  Administered 2019-02-23 – 2019-02-24 (×8): 750 mg via ORAL
  Filled 2019-02-23 (×9): qty 1

## 2019-02-23 MED ORDER — METHOCARBAMOL 750 MG PO TABS: 750.0000 mg | ORAL_TABLET | Freq: Four times a day (QID) | ORAL | Status: DC | PRN
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
## 2019-02-23 NOTE — Progress Notes (Signed)
Patient ID: Gary Ashley, male   DOB: [birth year], 38 y.o.   MRN: [id number] Looks great, mild headache, no NTW, GCS15, no focal deficit, will sign off, please call if we can be of further assistance
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
## 2019-02-24 DIAGNOSIS — S2239XA Fracture of one rib, unspecified side, initial encounter for closed fracture: Secondary | ICD-10-CM

## 2019-02-24 DIAGNOSIS — S82151A Displaced fracture of right tibial tuberosity, initial encounter for closed fracture: Secondary | ICD-10-CM

## 2019-02-24 DIAGNOSIS — S12600A Unspecified displaced fracture of seventh cervical vertebra, initial encounter for closed fracture: Secondary | ICD-10-CM

## 2019-02-24 DIAGNOSIS — S0292XA Unspecified fracture of facial bones, initial encounter for closed fracture: Secondary | ICD-10-CM

## 2019-02-24 DIAGNOSIS — S81012A Laceration without foreign body, left knee, initial encounter: Secondary | ICD-10-CM

## 2019-02-24 DIAGNOSIS — S270XXA Traumatic pneumothorax, initial encounter: Secondary | ICD-10-CM

## 2019-02-24 DIAGNOSIS — I609 Nontraumatic subarachnoid hemorrhage, unspecified: Secondary | ICD-10-CM

## 2019-02-24 DIAGNOSIS — S2249XA Multiple fractures of ribs, unspecified side, initial encounter for closed fracture: Secondary | ICD-10-CM

## 2019-02-24 LAB — TRIGLYCERIDES: Triglycerides: 79 mg/dL (ref <150)

## 2019-02-24 MED ORDER — ENOXAPARIN SODIUM 40 MG/0.4ML ~~LOC~~ SOLN
40.0000 mg | SUBCUTANEOUS | Status: DC
Start: 2019-02-24 — End: 2019-02-28
  Administered 2019-02-24 – 2019-02-28 (×5): 40 mg via SUBCUTANEOUS
  Filled 2019-02-24 (×5): qty 0.4
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
## 2019-02-24 NOTE — Progress Notes (Signed)
Inpatient Rehab Admissions:  Inpatient Rehab Consult received.  I met with patient at the bedside for rehabilitation assessment and to discuss goals and expectations of an inpatient rehab admission.  He is interested in [facility] program.  Does report that he lives with his cousin who is home 24/7 and the mother of his child would be willing to help as well.  Unfortunately he does not have any contact information for them at this time.  RN working on locating pt's belongings.  Will continue to follow and work towards finalizing dispo for possible admission tomorrow pending above.    Signed: Shann Medal, PT, DPT Admissions Coordinator [phone] 02/24/19  11:52 AM

## 2019-02-25 LAB — TRIGLYCERIDES: Triglycerides: 62 mg/dL (ref <150)

## 2019-02-25 MED ORDER — METHOCARBAMOL 500 MG PO TABS
1000.0000 mg | ORAL_TABLET | Freq: Four times a day (QID) | ORAL | Status: DC
  Administered 2019-02-25 – 2019-03-02 (×22): 1000 mg via ORAL
  Filled 2019-02-25 (×23): qty 2

## 2019-02-25 MED ORDER — GABAPENTIN 300 MG PO CAPS
300.0000 mg | ORAL_CAPSULE | Freq: Three times a day (TID) | ORAL | Status: DC
  Administered 2019-02-25 – 2019-03-02 (×17): 300 mg via ORAL
  Filled 2019-02-25 (×17): qty 1

## 2019-02-25 NOTE — Progress Notes (Signed)
Inpatient Rehab Admissions Coordinator:   I met with patient at the bedside.  He has his phone, but is reluctant to provide any information from it, stating "I can't look through it right now." I was able to contact pt's mother who reports she cannot take patient home with her and that she does not have contact info for pt's cousin [name]). I attempted to leave a voicemail for [name] (mother of pt's child), but her voicemail box was full.    Per pt, his apartment in [place] has a full flight of steps to enter, which may be a barrier to discharge.  Will need to confirm a dispo before I can work on possible admission for this patient.   Shann Medal, PT, DPT Admissions Coordinator [phone] 02/25/19  11:10 AM
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
## 2019-02-25 NOTE — Progress Notes (Signed)
OT Cancellation Note  Patient Details Name: Gary Ashley MRN: [id number] DOB: December 05, 1980   Cancelled Treatment:    Reason Eval/Treat Not Completed: Patient declined, no reason specified; pt reports increased pain and feeling tired this PM, declines working with therapies. Spoke with RN who reports pt recently had pain medication. Will follow.  Lou Cal, OT Supplemental Rehabilitation Services Pager [phone] Office [phone]   Raymondo Band 02/25/2019, 3:23 PM
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
## 2019-02-26 LAB — TRIGLYCERIDES: Triglycerides: 65 mg/dL
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
## 2019-02-26 NOTE — Progress Notes (Signed)
CSW met with patient to engage in SBIRT screen. CSW noted patient scored a 6 although denies any substance use/drinking in past 6 months. CSW offered resources or referral and noted patient declined. CSW signing off at this time, please re-consult for future social work needs.  Lamonte Richer, LCSW, [place] Worker II [phone]

## 2019-02-27 LAB — TRIGLYCERIDES: Triglycerides: 55 mg/dL (ref <150)

## 2019-02-27 MED ORDER — TRAMADOL HCL 50 MG PO TABS
100.0000 mg | ORAL_TABLET | Freq: Four times a day (QID) | ORAL | Status: DC
  Administered 2019-02-27 – 2019-03-02 (×13): 100 mg via ORAL
  Filled 2019-02-27 (×13): qty 2
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
## 2019-02-28 ENCOUNTER — Inpatient Hospital Stay (HOSPITAL_COMMUNITY): Payer: Medicaid Other

## 2019-02-28 DIAGNOSIS — R609 Edema, unspecified: Secondary | ICD-10-CM

## 2019-02-28 DIAGNOSIS — R52 Pain, unspecified: Secondary | ICD-10-CM

## 2019-02-28 LAB — TRIGLYCERIDES: Triglycerides: 54 mg/dL (ref <150)

## 2019-02-28 MED ORDER — ENOXAPARIN SODIUM 80 MG/0.8ML ~~LOC~~ SOLN: 1.0000 mg/kg | Freq: Two times a day (BID) | SUBCUTANEOUS | Status: DC

## 2019-02-28 MED ORDER — ENOXAPARIN SODIUM 80 MG/0.8ML ~~LOC~~ SOLN
70.0000 mg | SUBCUTANEOUS | Status: DC
  Filled 2019-02-28: qty 0.7

## 2019-02-28 MED ORDER — ENOXAPARIN SODIUM 300 MG/3ML IJ SOLN
25.0000 mg | INTRAMUSCULAR | Status: AC
  Administered 2019-02-28: 25 mg via SUBCUTANEOUS
  Filled 2019-02-28: qty 0.25

## 2019-02-28 NOTE — Progress Notes (Signed)
Ortho Trauma Note  Concern about calf tenderness and swelling. Maintaining WB precautions  Dressing taken down, swelling is present but to be expected for severity of injury. +calf tenderness. Incisions are clean and dry.Motor and sensory intact  A/P s/p ORIF tibial plateau  NWB, hinged knee brace locked in extension-->Okay to start on gentle ROM of knee start with 30 degrees and can increase as tolerated, stay below <90 degrees until 4 weeks post op [place] today to eval for DVT If goes to CIR will follow along, if discharges recommend follow up in 1-2 weeks for suture removal and x-rays  Shona Needles, MD [facility] [phone] (phone) [phone] (office) [email or web address]
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
## 2019-02-28 NOTE — Progress Notes (Addendum)
Would recommend repeat head CT before starting lovenox. If Ct head is stable may resume therapeutic Lovenox for DVT.

## 2019-02-28 NOTE — Progress Notes (Signed)
Got a page from vascular US tech stating that pt did have a RLE DVT. I placed a consult for pharmacy for lovenox treatment.   Jackson Latino, [facility] Pager [phone]

## 2019-02-28 NOTE — Progress Notes (Signed)
VASCULAR LAB PRELIMINARY  PRELIMINARY  PRELIMINARY  PRELIMINARY  Right lower extremity venous duplex completed.    Preliminary report:  See CV proc for preliminary results.   Called critical results to Jackson Latino, PA-C  [facility], Hero Mccathern, RVT 02/28/2019, 12:56 PM
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
## 2019-03-01 LAB — BASIC METABOLIC PANEL
Anion gap: 8 (ref 5–15)
BUN: 13 mg/dL (ref 6–20)
CO2: 30 mmol/L (ref 22–32)
Calcium: 9.5 mg/dL (ref 8.9–10.3)
Chloride: 97 mmol/L — ABNORMAL LOW (ref 98–111)
Creatinine, Ser: 0.77 mg/dL (ref 0.61–1.24)
GFR calc Af Amer: >60 mL/min (ref >60)
GFR calc non Af Amer: >60 mL/min (ref >60)
Glucose, Bld: 108 mg/dL — ABNORMAL HIGH (ref 70–99)
Potassium: 4.3 mmol/L (ref 3.5–5.1)
Sodium: 135 mmol/L (ref 135–145)

## 2019-03-01 LAB — CBC
HCT: 31.4 % — ABNORMAL LOW (ref 39.0–52.0)
Hemoglobin: 10.1 g/dL — ABNORMAL LOW (ref 13.0–17.0)
MCH: 30.5 pg (ref 26.0–34.0)
MCHC: 32.2 g/dL (ref 30.0–36.0)
MCV: 94.9 fL (ref 80.0–100.0)
Platelets: 493 10*3/uL — ABNORMAL HIGH (ref 150–400)
RBC: 3.31 MIL/uL — ABNORMAL LOW (ref 4.22–5.81)
RDW: 13.9 % (ref 11.5–15.5)
WBC: 9.2 10*3/uL (ref 4.0–10.5)
nRBC: 0 % (ref 0.0–0.2)

## 2019-03-01 MED ORDER — ENOXAPARIN SODIUM 80 MG/0.8ML ~~LOC~~ SOLN
1.0000 mg/kg | Freq: Two times a day (BID) | SUBCUTANEOUS | Status: DC
  Administered 2019-03-01 – 2019-03-02 (×3): 65 mg via SUBCUTANEOUS
  Filled 2019-03-01 (×4): qty 0.65

## 2019-03-01 NOTE — Anesthesia Postprocedure Evaluation (Signed)
Anesthesia Post Note  Patient: Gary Ashley  Procedure(s) Performed: OPEN REDUCTION INTERNAL FIXATION (ORIF) TIBIAL PLATEAU (Right )     Patient location during evaluation: PACU Anesthesia Type: General Level of consciousness: awake and alert Pain management: pain level controlled Vital Signs Assessment: post-procedure vital signs reviewed and stable Respiratory status: spontaneous breathing, nonlabored ventilation, respiratory function stable and patient connected to nasal cannula oxygen Cardiovascular status: blood pressure returned to baseline and stable Postop Assessment: no apparent nausea or vomiting Anesthetic complications: no    Last Vitals:  Vitals:   03/01/19 0352 03/01/19 0804  BP: (!) 124/96 136/82  Pulse: 71 70  Resp:  18  Temp: 36.6 C 36.9 C  SpO2: 98% 97%    Last Pain:  Vitals:   03/01/19 0804  TempSrc: Oral  PainSc:                  Jeffie Destyn Parfitt
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
## 2019-03-01 NOTE — Progress Notes (Signed)
Sutures removed from right ear.  Area clean, dry and intact, no signs or symptoms of infection.  Patient tolerated well.

## 2019-03-01 NOTE — TOC Progression Note (Signed)
Transition of Care [facility]) - Progression Note    Patient Details  Name: Gary Ashley MRN: [id number] Date of Birth: [birth year]  Transition of Care [facility]) CM/SW [place], [place] Phone Number: 03/01/2019, 3:18 PM  Clinical Narrative:    Pt requested CSW return to the room per RN Secretary.  Returned to room and pt had a piece of paper with [facility] number listed as [phone]. Provided this number to CIR. If [name] cannot provide 24/7 care then will need to seek SNF placement for pt.    Expected Discharge Plan: Skilled Nursing Facility Barriers to Discharge: [place] (PASRR), Continued Medical Work up, Inadequate or no insurance, SNF Pending payor source - [facility], SNF Pending Medicaid  Expected Discharge Plan and Services Expected Discharge Plan: [place] Choice: [place] Living arrangements for the past 2 months: Apartment    Social Determinants of Health (SDOH) Interventions    Readmission Risk Interventions No flowsheet data found.
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
## 2019-03-01 NOTE — Progress Notes (Signed)
Inpatient Rehab Admissions Coordinator:   I have not been able to get in touch with any family, except pt's mother, and she is unable to take patient home with her.  Since I am unable to confirm that pt will have 24/7 supervision at home (which he would need, given his TBI), CIR will sign off in favor of pt receiving longer term rehab at SNF level.   Shann Medal, PT, DPT Admissions Coordinator [phone] 03/01/19  9:11 AM

## 2019-03-02 ENCOUNTER — Other Ambulatory Visit: Payer: Self-pay

## 2019-03-02 ENCOUNTER — Inpatient Hospital Stay (HOSPITAL_COMMUNITY)
Admission: RE | Admit: 2019-03-02 | Discharge: 2019-03-09 | DRG: 945 | Disposition: A | Discharge status: Home Health | Payer: Medicaid Other | Source: Intra-hospital | Attending: Physical Medicine & Rehabilitation | Admitting: Physical Medicine & Rehabilitation

## 2019-03-02 ENCOUNTER — Encounter (HOSPITAL_COMMUNITY): Payer: Self-pay | Admitting: *Deleted

## 2019-03-02 DIAGNOSIS — D62 Acute posthemorrhagic anemia: Secondary | ICD-10-CM

## 2019-03-02 DIAGNOSIS — S12600D Unspecified displaced fracture of seventh cervical vertebra, subsequent encounter for fracture with routine healing: Secondary | ICD-10-CM | POA: Diagnosis not present

## 2019-03-02 DIAGNOSIS — R000 Tachycardia, unspecified: Secondary | ICD-10-CM | POA: Diagnosis present

## 2019-03-02 DIAGNOSIS — R5381 Other malaise: Secondary | ICD-10-CM | POA: Diagnosis present

## 2019-03-02 DIAGNOSIS — S066X9D Traumatic subarachnoid hemorrhage with loss of consciousness of unspecified duration, subsequent encounter: Principal | ICD-10-CM

## 2019-03-02 DIAGNOSIS — S2243XD Multiple fractures of ribs, bilateral, subsequent encounter for fracture with routine healing: Secondary | ICD-10-CM

## 2019-03-02 DIAGNOSIS — S82142A Displaced bicondylar fracture of left tibia, initial encounter for closed fracture: Secondary | ICD-10-CM

## 2019-03-02 DIAGNOSIS — S2239XA Fracture of one rib, unspecified side, initial encounter for closed fracture: Secondary | ICD-10-CM | POA: Diagnosis present

## 2019-03-02 DIAGNOSIS — F4310 Post-traumatic stress disorder, unspecified: Secondary | ICD-10-CM | POA: Diagnosis present

## 2019-03-02 DIAGNOSIS — J939 Pneumothorax, unspecified: Secondary | ICD-10-CM

## 2019-03-02 DIAGNOSIS — T1490XA Injury, unspecified, initial encounter: Secondary | ICD-10-CM | POA: Diagnosis present

## 2019-03-02 DIAGNOSIS — G8918 Other acute postprocedural pain: Secondary | ICD-10-CM

## 2019-03-02 DIAGNOSIS — F17210 Nicotine dependence, cigarettes, uncomplicated: Secondary | ICD-10-CM | POA: Diagnosis present

## 2019-03-02 DIAGNOSIS — E559 Vitamin D deficiency, unspecified: Secondary | ICD-10-CM | POA: Diagnosis present

## 2019-03-02 DIAGNOSIS — S02101D Fracture of base of skull, right side, subsequent encounter for fracture with routine healing: Secondary | ICD-10-CM

## 2019-03-02 DIAGNOSIS — S069X9A Unspecified intracranial injury with loss of consciousness of unspecified duration, initial encounter: Secondary | ICD-10-CM

## 2019-03-02 DIAGNOSIS — S81012A Laceration without foreign body, left knee, initial encounter: Secondary | ICD-10-CM

## 2019-03-02 DIAGNOSIS — S82141A Displaced bicondylar fracture of right tibia, initial encounter for closed fracture: Secondary | ICD-10-CM

## 2019-03-02 DIAGNOSIS — S12600A Unspecified displaced fracture of seventh cervical vertebra, initial encounter for closed fracture: Secondary | ICD-10-CM | POA: Diagnosis present

## 2019-03-02 DIAGNOSIS — S82141D Displaced bicondylar fracture of right tibia, subsequent encounter for closed fracture with routine healing: Secondary | ICD-10-CM

## 2019-03-02 DIAGNOSIS — S12601A Unspecified nondisplaced fracture of seventh cervical vertebra, initial encounter for closed fracture: Secondary | ICD-10-CM

## 2019-03-02 DIAGNOSIS — S270XXA Traumatic pneumothorax, initial encounter: Secondary | ICD-10-CM

## 2019-03-02 DIAGNOSIS — S02121D Fracture of orbital roof, right side, subsequent encounter for fracture with routine healing: Secondary | ICD-10-CM | POA: Diagnosis not present

## 2019-03-02 DIAGNOSIS — I609 Nontraumatic subarachnoid hemorrhage, unspecified: Secondary | ICD-10-CM

## 2019-03-02 DIAGNOSIS — S82151A Displaced fracture of right tibial tuberosity, initial encounter for closed fracture: Secondary | ICD-10-CM

## 2019-03-02 DIAGNOSIS — Z9889 Other specified postprocedural states: Secondary | ICD-10-CM

## 2019-03-02 DIAGNOSIS — S2249XA Multiple fractures of ribs, unspecified side, initial encounter for closed fracture: Secondary | ICD-10-CM | POA: Diagnosis present

## 2019-03-02 MED ORDER — PROCHLORPERAZINE 25 MG RE SUPP: 12.5000 mg | Freq: Four times a day (QID) | RECTAL | Status: DC | PRN

## 2019-03-02 MED ORDER — VITAMIN D (ERGOCALCIFEROL) 1.25 MG (50000 UNIT) PO CAPS
50000.0000 [IU] | ORAL_CAPSULE | ORAL | Status: DC
  Administered 2019-03-08: 50000 [IU] via ORAL
  Filled 2019-03-02: qty 1

## 2019-03-02 MED ORDER — METOPROLOL TARTRATE 12.5 MG HALF TABLET
12.5000 mg | ORAL_TABLET | Freq: Two times a day (BID) | ORAL | Status: DC
  Administered 2019-03-02 – 2019-03-09 (×14): 12.5 mg via ORAL
  Filled 2019-03-02 (×15): qty 1

## 2019-03-02 MED ORDER — FLEET ENEMA 7-19 GM/118ML RE ENEM: 1.0000 | ENEMA | Freq: Once | RECTAL | Status: DC | PRN

## 2019-03-02 MED ORDER — ACETAMINOPHEN 325 MG PO TABS
325.0000 mg | ORAL_TABLET | ORAL | Status: DC | PRN
  Administered 2019-03-07: 650 mg via ORAL
  Administered 2019-03-08: 325 mg via ORAL
  Filled 2019-03-02: qty 2

## 2019-03-02 MED ORDER — ENSURE ENLIVE PO LIQD
237.0000 mL | Freq: Two times a day (BID) | ORAL | Status: DC
  Administered 2019-03-03 – 2019-03-08 (×8): 237 mL via ORAL

## 2019-03-02 MED ORDER — PROCHLORPERAZINE EDISYLATE 10 MG/2ML IJ SOLN: 5.0000 mg | Freq: Four times a day (QID) | INTRAMUSCULAR | Status: DC | PRN

## 2019-03-02 MED ORDER — DIPHENHYDRAMINE HCL 12.5 MG/5ML PO ELIX: 12.5000 mg | ORAL_SOLUTION | Freq: Four times a day (QID) | ORAL | Status: DC | PRN

## 2019-03-02 MED ORDER — TRAMADOL HCL 50 MG PO TABS
100.0000 mg | ORAL_TABLET | Freq: Four times a day (QID) | ORAL | Status: DC
  Administered 2019-03-02 – 2019-03-08 (×22): 100 mg via ORAL
  Filled 2019-03-02 (×23): qty 2

## 2019-03-02 MED ORDER — GUAIFENESIN-DM 100-10 MG/5ML PO SYRP: 5.0000 mL | ORAL_SOLUTION | Freq: Four times a day (QID) | ORAL | Status: DC | PRN

## 2019-03-02 MED ORDER — VITAMIN D 25 MCG (1000 UNIT) PO TABS
4000.0000 [IU] | ORAL_TABLET | Freq: Every day | ORAL | Status: DC
  Administered 2019-03-03 – 2019-03-09 (×7): 4000 [IU] via ORAL
  Filled 2019-03-02 (×7): qty 4

## 2019-03-02 MED ORDER — BISACODYL 10 MG RE SUPP: 10.0000 mg | Freq: Every day | RECTAL | Status: DC | PRN

## 2019-03-02 MED ORDER — PROCHLORPERAZINE MALEATE 5 MG PO TABS: 5.0000 mg | ORAL_TABLET | Freq: Four times a day (QID) | ORAL | Status: DC | PRN

## 2019-03-02 MED ORDER — DOCUSATE SODIUM 100 MG PO CAPS
100.0000 mg | ORAL_CAPSULE | Freq: Two times a day (BID) | ORAL | Status: DC
  Administered 2019-03-02 – 2019-03-09 (×14): 100 mg via ORAL
  Filled 2019-03-02 (×15): qty 1

## 2019-03-02 MED ORDER — GABAPENTIN 300 MG PO CAPS
300.0000 mg | ORAL_CAPSULE | Freq: Three times a day (TID) | ORAL | Status: DC
  Administered 2019-03-02 – 2019-03-09 (×20): 300 mg via ORAL
  Filled 2019-03-02 (×22): qty 1

## 2019-03-02 MED ORDER — METHOCARBAMOL 500 MG PO TABS
1000.0000 mg | ORAL_TABLET | Freq: Four times a day (QID) | ORAL | Status: DC
  Administered 2019-03-02 – 2019-03-09 (×28): 1000 mg via ORAL
  Filled 2019-03-02 (×28): qty 2

## 2019-03-02 MED ORDER — ALUM & MAG HYDROXIDE-SIMETH 200-200-20 MG/5ML PO SUSP: 30.0000 mL | ORAL | Status: DC | PRN

## 2019-03-02 MED ORDER — OXYCODONE HCL 5 MG PO TABS
5.0000 mg | ORAL_TABLET | ORAL | Status: DC | PRN
  Administered 2019-03-03 – 2019-03-05 (×6): 10 mg via ORAL
  Administered 2019-03-05: 5 mg via ORAL
  Filled 2019-03-02: qty 2
  Filled 2019-03-02: qty 1
  Filled 2019-03-02 (×7): qty 2

## 2019-03-02 MED ORDER — POLYETHYLENE GLYCOL 3350 17 G PO PACK
17.0000 g | PACK | Freq: Every day | ORAL | Status: DC | PRN
  Filled 2019-03-02: qty 1

## 2019-03-02 MED ORDER — TRAZODONE HCL 50 MG PO TABS: 25.0000 mg | ORAL_TABLET | Freq: Every evening | ORAL | Status: DC | PRN

## 2019-03-02 MED ORDER — ENOXAPARIN SODIUM 80 MG/0.8ML ~~LOC~~ SOLN
1.0000 mg/kg | Freq: Two times a day (BID) | SUBCUTANEOUS | Status: DC
  Administered 2019-03-02 – 2019-03-09 (×14): 65 mg via SUBCUTANEOUS
  Filled 2019-03-02 (×15): qty 0.65

## 2019-03-02 MED ORDER — ACETAMINOPHEN 325 MG PO TABS
650.0000 mg | ORAL_TABLET | Freq: Four times a day (QID) | ORAL | Status: DC
  Administered 2019-03-02 – 2019-03-09 (×19): 650 mg via ORAL
  Filled 2019-03-02 (×24): qty 2
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
## 2019-03-02 NOTE — TOC Transition Note (Signed)
Transition of Care [facility]) - CM/SW Discharge Note   Patient Details  Name: Gary Ashley MRN: [id number] Date of Birth: 12-16-1980  Transition of Care [facility]) CM/SW Contact:  Alexander Mt, [place] Phone Number: 03/02/2019, 12:42 PM   Clinical Narrative:    Pt accepted for CIR, has 24/7 assist at home which CIR has confirmed with pt cousin [name].  Final next level of care: IP Rehab Facility Barriers to Discharge: Barriers Resolved   Patient Goals and CMS Choice Patient states their goals for this hospitalization and ongoing recovery are:: to go back to my cousins house   Choice offered to / list presented to : Patient  Discharge Placement  CIR  Discharge Plan and Services     Post Acute Care Choice: [place]                               Social Determinants of Health (SDOH) Interventions     Readmission Risk Interventions No flowsheet data found.
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
## 2019-03-02 NOTE — Progress Notes (Signed)
PT Cancellation Note  Patient Details Name: Gary Ashley MRN: [id number] DOB: May 20, 1981   Cancelled Treatment:    Reason Eval/Treat Not Completed: Patient declined, no reason specified;Fatigue/lethargy limiting ability to participate Patient declining OOB mobility with PT/OT this morning. States he is very tired and did not sleep well. PT/OT to re-attempt this afternoon as time allows.    Lanney Gins, PT, DPT Supplemental Physical Therapist 03/02/19 10:01 AM Pager: [phone] Office: [phone]

## 2019-03-02 NOTE — Progress Notes (Signed)
OT Cancellation Note  Patient Details Name: Gary Ashley MRN: [id number] DOB: [birth year]   Cancelled Treatment:    Reason Eval/Treat Not Completed: Patient declined, no reason specified; Patient declining OOB mobility with PT/OT this morning. States he is very tired and did not sleep well. PT/OT to re-attempt this afternoon as schedule allows.   Lou Cal, OT Supplemental Rehabilitation Services Pager [phone] Office [phone]   Raymondo Band 03/02/2019, 10:05 AM
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
## 2019-03-02 NOTE — Progress Notes (Signed)
Inpatient Rehab Admissions Coordinator:   I was able to reach pt's cousin, [name], today.  He confirms that pt is okay to discharge home with him, and he is able to provide 24/7 assist. Also mentions that there are a lot of other family members in the same apartment complex and all will be able to assist if needed.  I can admit today pending approval from pt's attending.    Shann Medal, PT, DPT Admissions Coordinator [phone] 03/02/19  9:51 AM
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
## 2019-03-03 ENCOUNTER — Inpatient Hospital Stay (HOSPITAL_COMMUNITY): Payer: Self-pay

## 2019-03-03 ENCOUNTER — Inpatient Hospital Stay (HOSPITAL_COMMUNITY): Payer: Self-pay | Admitting: Physical Therapy

## 2019-03-03 ENCOUNTER — Inpatient Hospital Stay (HOSPITAL_COMMUNITY): Payer: Self-pay | Admitting: Speech Pathology

## 2019-03-03 DIAGNOSIS — S12690S Other displaced fracture of seventh cervical vertebra, sequela: Secondary | ICD-10-CM

## 2019-03-03 DIAGNOSIS — D62 Acute posthemorrhagic anemia: Secondary | ICD-10-CM

## 2019-03-03 DIAGNOSIS — S066X3S Traumatic subarachnoid hemorrhage with loss of consciousness of 1 hour to 5 hours 59 minutes, sequela: Secondary | ICD-10-CM

## 2019-03-03 DIAGNOSIS — T1490XA Injury, unspecified, initial encounter: Secondary | ICD-10-CM

## 2019-03-03 LAB — COMPREHENSIVE METABOLIC PANEL
ALT: 31 U/L (ref 0–44)
AST: 31 U/L (ref 15–41)
Albumin: 3.2 g/dL — ABNORMAL LOW (ref 3.5–5.0)
Alkaline Phosphatase: 52 U/L (ref 38–126)
Anion gap: 8 (ref 5–15)
BUN: 14 mg/dL (ref 6–20)
CO2: 29 mmol/L (ref 22–32)
Calcium: 9.4 mg/dL (ref 8.9–10.3)
Chloride: 100 mmol/L (ref 98–111)
Creatinine, Ser: 0.86 mg/dL (ref 0.61–1.24)
GFR calc Af Amer: >60 mL/min (ref >60)
GFR calc non Af Amer: >60 mL/min (ref >60)
Glucose, Bld: 101 mg/dL — ABNORMAL HIGH (ref 70–99)
Potassium: 4 mmol/L (ref 3.5–5.1)
Sodium: 137 mmol/L (ref 135–145)
Total Bilirubin: 0.5 mg/dL (ref 0.3–1.2)
Total Protein: 7 g/dL (ref 6.5–8.1)

## 2019-03-03 LAB — CBC WITH DIFFERENTIAL/PLATELET
Abs Immature Granulocytes: 0.04 10*3/uL (ref 0.00–0.07)
Basophils Absolute: 0 10*3/uL (ref 0.0–0.1)
Basophils Relative: 0 %
Eosinophils Absolute: 0.2 10*3/uL (ref 0.0–0.5)
Eosinophils Relative: 2 %
HCT: 30.1 % — ABNORMAL LOW (ref 39.0–52.0)
Hemoglobin: 9.8 g/dL — ABNORMAL LOW (ref 13.0–17.0)
Immature Granulocytes: 0 %
Lymphocytes Relative: 13 %
Lymphs Abs: 1.4 10*3/uL (ref 0.7–4.0)
MCH: 30.9 pg (ref 26.0–34.0)
MCHC: 32.6 g/dL (ref 30.0–36.0)
MCV: 95 fL (ref 80.0–100.0)
Monocytes Absolute: 0.9 10*3/uL (ref 0.1–1.0)
Monocytes Relative: 9 %
Neutro Abs: 7.6 10*3/uL (ref 1.7–7.7)
Neutrophils Relative %: 76 %
Platelets: 509 10*3/uL — ABNORMAL HIGH (ref 150–400)
RBC: 3.17 MIL/uL — ABNORMAL LOW (ref 4.22–5.81)
RDW: 13.8 % (ref 11.5–15.5)
WBC: 10.1 10*3/uL (ref 4.0–10.5)
nRBC: 0 % (ref 0.0–0.2)
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
## 2019-03-03 NOTE — Progress Notes (Signed)
Inpatient Rehabilitation  Patient information reviewed and entered into eRehab system by Aimie Wagman M. Rad Gramling, M.A., CCC/SLP, PPS Coordinator.  Information including medical coding, functional ability and quality indicators will be reviewed and updated through discharge.    
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
## 2019-03-03 NOTE — Progress Notes (Signed)
Occupational Therapy Session Note  Patient Details  Name: Gary Ashley MRN: [id number] Date of Birth: 1981/04/15  Today's Date: 03/03/2019 OT Individual Time: 1300-1325 OT Individual Time Calculation (min): 25 min    Short Term Goals: Week 1:  OT Short Term Goal 1 (Week 1): STG=LTG d/t ELOS  Skilled Therapeutic Interventions/Progress Updates:  1:1. Pt received asleep in bed. Pt initially grumpy about "not being able to get any rest." OT educates pt on schedule and what is to be expected each day per POC and pt more agreeable to tx after 5 min of venting. Pt completes supine>sitting EOB with S. OT demo use of sock aide and dressing stick to doff/don socks. Pt able to complete with mod VC for technqiue. Exited session with pt seated in bed, exit alarm on and call light in reach  Therapy Documentation Precautions:  Precautions Precaution Comments: RLE hinged knee brace locked in extension Required Braces or Orthoses: Cervical Brace Knee Immobilizer - Right: On at all times Cervical Brace: Hard collar, At all times Other Brace: RLE hinged knee brace locked in extension Restrictions Weight Bearing Restrictions: Yes RLE Weight Bearing: Non weight bearing LLE Weight Bearing: Weight bearing as tolerated General: General Chart Reviewed: Yes Family/Caregiver Present: No Vital Signs:   Pain: Pain Assessment Pain Score: 0-No pain ADL:   Vision Baseline Vision/History: No visual deficits Patient Visual Report: No change from baseline Perception  Perception: Within Functional Limits Praxis Praxis: Intact Exercises:   Other Treatments:     Therapy/Group: Individual Therapy  Tonny Branch 03/03/2019, 1:27 PM

## 2019-03-04 ENCOUNTER — Inpatient Hospital Stay (HOSPITAL_COMMUNITY): Payer: Self-pay | Admitting: Physical Therapy

## 2019-03-04 ENCOUNTER — Inpatient Hospital Stay (HOSPITAL_COMMUNITY): Payer: Self-pay

## 2019-03-04 NOTE — Progress Notes (Signed)
[place] PHYSICAL MEDICINE & REHABILITATION PROGRESS NOTE   Subjective/Complaints: A little restless last night. Legs hurt at times. Didn't sleep as well. Had a good day with therapies yesterday  ROS: Patient denies fever, rash, sore throat, blurred vision, nausea, vomiting, diarrhea, cough, shortness of breath or chest pain,  headache, or mood change.    Objective:   No results found. Recent Labs    03/03/19 0806  WBC 10.1  HGB 9.8*  HCT 30.1*  PLT 509*   Recent Labs    03/03/19 0806  NA 137  K 4.0  CL 100  CO2 29  GLUCOSE 101*  BUN 14  CREATININE 0.86  CALCIUM 9.4    Intake/Output Summary (Last 24 hours) at 03/04/2019 0936 Last data filed at 03/04/2019 0725 Gross per 24 hour  Intake 916 ml  Output 1550 ml  Net -634 ml     Physical Exam: Vital Signs Blood pressure 122/75, pulse 71, temperature 98.3 F (36.8 C), resp. rate 17, height 6' (1.829 m), weight 63.5 kg, SpO2 100 %. Constitutional: No distress . Vital signs reviewed. HEENT: EOMI, oral membranes moist Neck: supple Cardiovascular: RRR without murmur. No JVD    Respiratory: CTA Bilaterally without wheezes or rales. Normal effort    GI: BS +, non-tender, non-distended  Musculoskeletal:     Comments: RLE>LLE with edema and tenderness. Incisions bilateral knees C/D/I with sutures in place with KI in place   Neurological: more focused, language normal.. He is alert and oriented to person, place, and time.  Speech clear.  Motor: Bilateral upper extremities: 5/5 proximal distal Right lower extremity: Hip flexion 3/5, (with knee in brace), ankle dorsiflexion 5/5 Left lower extremity: Hip flexion, knee extension 4-4+/5, ankle dorsiflexion 5/5 Sensation intact in all 4's--motor/sensory stable Skin:  Scattered abrasions along face, arms, legs. Surgical incision remains C/D/I  Psychiatric: more calm and controlled this morning  Assessment/Plan: 1. Functional deficits secondary to TBI/polytrauma which  require 3+ hours per day of interdisciplinary therapy in a comprehensive inpatient rehab setting.  Physiatrist is providing close team supervision and 24 hour management of active medical problems listed below.  Physiatrist and rehab team continue to assess barriers to discharge/monitor patient progress toward functional and medical goals  Care Tool:  Bathing    Body parts bathed by patient: Right arm, Left arm, Chest, Abdomen, Front perineal area, Buttocks, Right upper leg, Left upper leg, Face   Body parts bathed by helper: Right lower leg, Left lower leg     Bathing assist Assist Level: Minimal Assistance - Patient > 75%     Upper Body Dressing/Undressing Upper body dressing   What is the patient wearing?: Pull over shirt    Upper body assist Assist Level: Set up assist    Lower Body Dressing/Undressing Lower body dressing      What is the patient wearing?: Pants, Underwear/pull up     Lower body assist Assist for lower body dressing: Maximal Assistance - Patient 25 - 49%     Toileting Toileting    Toileting assist Assist for toileting: Independent with assistive device Assistive Device Comment: Urinal   Transfers Chair/bed transfer  Transfers assist     Chair/bed transfer assist level: Minimal Assistance - Patient > 75%     Locomotion Ambulation   Ambulation assist      Assist level: Minimal Assistance - Patient > 75% Assistive device: Walker-rolling Max distance: 10   Walk 10 feet activity   Assist  Walk 10 feet activity did not  occur: Safety/medical concerns  Assist level: Minimal Assistance - Patient > 75% Assistive device: Walker-rolling   Walk 50 feet activity   Assist Walk 50 feet with 2 turns activity did not occur: Safety/medical concerns         Walk 150 feet activity   Assist Walk 150 feet activity did not occur: Safety/medical concerns         Walk 10 feet on uneven surface  activity   Assist Walk 10 feet on  uneven surfaces activity did not occur: Safety/medical concerns         Wheelchair     Assist Will patient use wheelchair at discharge?: Yes Type of Wheelchair: Manual    Wheelchair assist level: Supervision/Verbal cueing Max wheelchair distance: 100    Wheelchair 50 feet with 2 turns activity    Assist        Assist Level: Supervision/Verbal cueing   Wheelchair 150 feet activity     Assist Wheelchair 150 feet activity did not occur: Safety/medical concerns        Medical Problem List and Plan: 1.  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             SAH with basilar skull fx, right temporal EDH  -right orbital roof/sphenoid sinus fx with tripod fx with zygomatic depression  -right tibial plateau fx--s/p ORIF 6/8---NWB RLE with hinged knee brace  -left knee degloving injury  -C7 TVP fx----cervical collar at all times  -PTX with bilateral 1,2 rib fx's     .-Continue CIR therapies including PT, OT, and SLP  2. RLE DVT/ Antithrombotics: right posterior tib/peroneal dvt -DVT/anticoagulation:  treatment dose lovenox             -antiplatelet therapy: N/A 3. Pain Management: On tylenol 650 mg qid, Robaxin 1000 mg qid, ultram 100 mg qid and Gabapentin 300 mg tid --in addition to oxycodone 10 mg every 4 hours prn.    -reasonable control at present 4. Mood:  LCSW to follow for evaluation and support.              -antipsychotic agents: N/A  -environmental mod 5. Neuropsych: This patient is capable of making decisions on his own behalf. 6. Skin/Wound Care: local wound care to incisions, all are clean 7. Fluids/Electrolytes/Nutrition: pt has good appetite   .   8. Vitamin D deficiency: No on 50,000 units weekly with 4,000 units daily.  9. ABLA: hgb 9.8 6/18 10. Tachycardia: HR continues to elevate to 140's intermittently.  Continue low dose BB and monitor for orthostatic changes.               HR remains well controlled      LOS: 2 days A FACE TO FACE EVALUATION WAS PERFORMED  Gary Ashley 03/04/2019, 9:36 AM

## 2019-03-04 NOTE — Progress Notes (Signed)
Physical Therapy Session Note  Patient Details  Name: Gary QuinonesChristopher L Tobler MRN: [id number] Date of Birth: 01/16/1981  Today's Date: 03/04/2019 PT Individual Time: 1100-1108 and [id number] PT Individual Time Calculation (min): 8 min and 68 min  Short Term Goals: Week 1:  PT Short Term Goal 1 (Week 1): STG=LTG due to ELOS  Skilled Therapeutic Interventions/Progress Updates:  Treatment 1: Pt received in bed reporting 10/10 RLE and states he hasn't received pain medication yet. Unable to locate pt's RN so requested charge nurse administer pain meds when possible. Discussed home set up with pt with pt reporting he lives in a multilevel home with his cousin with toilet/bedroom on 2nd floor and need to access 2nd floor. Pt unwilling to participate in therapy 2/2 pain. Pt left in bed with all needs at hand. Will f/u per POC.  Treatment 2: Pt received in bed & agreeable to tx, denying c/o pain at rest. Pt with impaired awareness, reporting he will not be able to go up stairs but then has to access bathroom on the 2nd level of the home. Pt transfers to sitting EOB with mod I & hospital bed features & bed>w/c via stand pivot with supervision. Pt is able to attach R ELR to w/c but requires instructional cuing for how to lower leg rest. Pt propels w/c around unit with BUE & supervision and sit<>stand with supervision. Pt hops on LLE x 150 ft with RW & supervision, adjusting pants while standing with supervision for balance with 1 standing rest break 2/2 fatigue. At stairwell therapist provided demonstration re: bumping up flight of steps on bottom vs hopping as it is unlikely pt will be able to hop up/down steps with L rail 2/2 RLE ROM restrictions. After much conversation pt reports he does not want to bump up/down steps on buttocks at all and doesn't have the "courage" to attempt hopping up/down steps with L rail & RUE HHA on this date. Pt reports need to use restroom and propels back to room with distant  supervision. Pt requests to use BSC to be able to see TV while toileting vs hopping into bathroom &  Using actual toilet. Pt completes stand pivot w/c<>BSC with RW & supervision, managing clothing without assistance. Pt performs hand hygiene mod I from w/c level & propels w/c room<>dayroom. Pt transfers w/c<>nu-step via stand pivot without AD with supervision with MAX cuing for proper positioning of w/c, need to move leg rests out of the way, and overall safety with task as pt demonstrates very poor awareness. Pt utilizes nu-step on level 3 x 10 minutes with LLE & BUE (RLE elevated on stool) with rest breaks PRN & task focusing on global strengthening & endurance. Back in room therapist attempts to show pt how to swing leg rests out of the way but pt with low frustration tolerance and stands to hop to bed without RW in position and with leg rests in the way, yelling at therapist. Pt transitions sit>supine with mod I. Pt left in bed with alarm set & all needs in reach.  Pt requires cuing for adjusting & managing w/c parts and cuing to lock brakes.   Pt also requires cuing to not turn head with therapist adjusting c-collar during session.    Therapy Documentation Precautions:  Precautions Precaution Comments: RLE hinged knee brace locked in extension Required Braces or Orthoses: Cervical Brace Knee Immobilizer - Right: On at all times Cervical Brace: Hard collar, At all times Other Brace: RLE hinged knee brace locked  in extension Restrictions Weight Bearing Restrictions: Yes RLE Weight Bearing: Non weight bearing LLE Weight Bearing: Weight bearing as tolerated   General: PT Amount of Missed Time (min): 52 Minutes PT Missed Treatment Reason: Pain    Therapy/Group: Individual Therapy  [name] 03/04/2019, 3:29 PM

## 2019-03-04 NOTE — Progress Notes (Signed)
Occupational Therapy Session Note  Patient Details  Name: Gary Ashley MRN: [id number] Date of Birth: Dec 31, 1980  Today's Date: 03/04/2019 OT Individual Time: 7290-2111 OT Individual Time Calculation (min): 28 min    Short Term Goals: Week 1:  OT Short Term Goal 1 (Week 1): STG=LTG d/t ELOS  Skilled Therapeutic Interventions/Progress Updates:    1;1. Pt received in bed reporting received medication fo rpain ~30 min prior and starting to kick in. Pt completes stand pivo ttransfer with Supervision with RW EOB<>w/c with VC for using RW instead of trying to hop on one foot for safety awareness. Pt completes 2x15 UB therex with 4# dowel rod chest press, shoulder flex/ext, int/ext rotation, ab/adduct, and elbow flex/ext to improve BUE strength required for BADls  Therapy Documentation Precautions:  Precautions Precaution Comments: RLE hinged knee brace locked in extension Required Braces or Orthoses: Cervical Brace Knee Immobilizer - Right: On at all times Cervical Brace: Hard collar, At all times Other Brace: RLE hinged knee brace locked in extension Restrictions Weight Bearing Restrictions: Yes RLE Weight Bearing: Non weight bearing LLE Weight Bearing: Weight bearing as tolerated General: General PT Missed Treatment Reason: Pain Vital Signs:   Pain:   ADL:   Vision   Perception    Praxis   Exercises:   Other Treatments:     Therapy/Group: Individual Therapy  Tonny Branch 03/04/2019, 1:30 PM

## 2019-03-04 NOTE — Progress Notes (Signed)
Occupational Therapy Session Note  Patient Details  Name: Gary Ashley MRN: [id number] Date of Birth: [birth year]  Today's Date: 03/04/2019 OT Individual Time: 0900-1000 OT Individual Time Calculation (min): 60 min    Short Term Goals: Week 1:  OT Short Term Goal 1 (Week 1): STG=LTG d/t ELOS  Skilled Therapeutic Interventions/Progress Updates:    1;1. Pt received in bed agreeable to OT. Pt declines bathing and dressing this morning. Pt completes squat pivot transfer to w/c from EOB with set up. Pt completes grooming at sink with MOD I seated in w/c. Pt propels w/c with supervision for VC for tight turning. Pt completes ambulatory transfer with Supervision with RW to couch, TTB and w/c with VC for locking w/c brakes prior to sit<>stand. Pt completes kitchen search activity with walker bag after education on energy conservation, safe RW management as well as safe reaching. Pt verbalizes understanding of education. Pt completes wii bowling activity seated in w/c d/t pain increasing when standing. RN alerted. Exited session with pt seated in w/c call light in reach, belt alarm on, no alarm box present (NT alerted) and all needs met.   Therapy Documentation Precautions:  Precautions Precaution Comments: RLE hinged knee brace locked in extension Required Braces or Orthoses: Cervical Brace Knee Immobilizer - Right: On at all times Cervical Brace: Hard collar, At all times Other Brace: RLE hinged knee brace locked in extension Restrictions Weight Bearing Restrictions: Yes RLE Weight Bearing: Non weight bearing LLE Weight Bearing: Weight bearing as tolerated General:   Vital Signs: Therapy Vitals Temp: 98.3 F (36.8 C) Pulse Rate: 71 Resp: 17 BP: 122/75 Patient Position (if appropriate): Lying Oxygen Therapy SpO2: 100 % O2 Device: Room Air Pain: Pain Assessment Pain Scale: 0-10 Pain Score: 5  Pain Type: Surgical pain Pain Location: Leg Pain Orientation: Right Pain  Descriptors / Indicators: Aching Pain Intervention(s): Medication (See eMAR) ADL:   Vision   Perception    Praxis   Exercises:   Other Treatments:     Therapy/Group: Individual Therapy  Tonny Branch 03/04/2019, 9:09 AM
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
## 2019-03-05 ENCOUNTER — Inpatient Hospital Stay (HOSPITAL_COMMUNITY): Payer: Self-pay | Admitting: Physical Therapy

## 2019-03-05 ENCOUNTER — Inpatient Hospital Stay (HOSPITAL_COMMUNITY): Payer: Self-pay

## 2019-03-05 DIAGNOSIS — S82141D Displaced bicondylar fracture of right tibia, subsequent encounter for closed fracture with routine healing: Secondary | ICD-10-CM

## 2019-03-05 LAB — BASIC METABOLIC PANEL
Anion gap: 9 (ref 5–15)
BUN: 13 mg/dL (ref 6–20)
CO2: 27 mmol/L (ref 22–32)
Calcium: 9.3 mg/dL (ref 8.9–10.3)
Chloride: 101 mmol/L (ref 98–111)
Creatinine, Ser: 0.77 mg/dL (ref 0.61–1.24)
GFR calc Af Amer: >60 mL/min (ref >60)
GFR calc non Af Amer: >60 mL/min (ref >60)
Glucose, Bld: 93 mg/dL (ref 70–99)
Potassium: 3.9 mmol/L (ref 3.5–5.1)
Sodium: 137 mmol/L (ref 135–145)

## 2019-03-05 NOTE — Progress Notes (Signed)
Physical Therapy Session Note  Patient Details  Name: Gary Ashley MRN: [id number] Date of Birth: [birth year]  Today's Date: 03/05/2019 PT Individual Time: 1005-1101 PT Individual Time Calculation (min): 56 min   Short Term Goals: Week 1:  PT Short Term Goal 1 (Week 1): STG=LTG due to ELOS  Skilled Therapeutic Interventions/Progress Updates:    Pt received asleep, supine in bed, easily awakens to name and is agreeable to therapy session. Pt wearing hard cervical collar and R LE knee extension brace throughout entire session. Supine>sit, HOB slightly elevated, mod-I. Stand pivot transfer EOB>w/c using RW with CGA/close supervision. Pt reports pain as 7/10 in R LE - RN notified and present for medication administration - therapist provided ice back to place on R knee at end of session. Pt performed B UE w/c propulsion ~159ft x2 to/from therapy gym and throughout session with distant supervision - pt demonstrates proper use of leg rests and breaks with min cuing for taking R leg rest off. Ambulated 92ft using RW with close supervision for safety and pt demonstrating proper hop-to pattern while maintaining R LE NWB status without cuing. Performed backwards step-up and forwards step-down progressing from 2">4">6" height step in parallel bars with B UE support and CGA throughout for safety - mirror feedback provided for increasing foot clearance awareness - pt able to maintain R LE NWB status during task. Performed 4 steps (6" height) stair navigation x2 using B HRs ascending backwards and descending forwards with CGA throughout 1st trial then 2nd trial pt had minor LOB at bottom of steps due to not fully getting L foot off step requiring min assist for recovery. Participated in standing balance task of cross body reaching to place clothespins on basketball goal while using RW with CGA for steadying and 1x min assist due to minor LOB - pt requires intermittent min cuing to maintain R LE NWB status due to  using the foot for balance. Pt performed w/c propulsion back to room as described above and left sitting in w/c with needs in reach and seat belt alarm on.  Therapy Documentation Precautions:  Precautions Precaution Comments: RLE hinged knee brace locked in extension Required Braces or Orthoses: Cervical Brace Knee Immobilizer - Right: On at all times Cervical Brace: Hard collar, At all times Other Brace: RLE hinged knee brace locked in extension Restrictions Weight Bearing Restrictions: Yes RLE Weight Bearing: Non weight bearing LLE Weight Bearing: Weight bearing as tolerated  Pain: Reports pain level in R LE 7/10 - RN notified and present for medications and therapist provided ice pack at end of session.   Therapy/Group: Individual Therapy  Tawana Scale, PT, DPT 03/05/2019, 8:03 AM
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
## 2019-03-05 NOTE — Progress Notes (Signed)
Occupational Therapy Session Note  Patient Details  Name: Gary Ashley MRN: [id number] Date of Birth: 1981/07/04  Today's Date: 03/05/2019 OT Individual Time: 5456-2563 OT Individual Time Calculation (min): 57 min    Short Term Goals: Week 1:  OT Short Term Goal 1 (Week 1): STG=LTG d/t ELOS  Skilled Therapeutic Interventions/Progress Updates:    1;1. Pt received in bed irritable about having to get up and not sleeping well overnight d/t pain. Pt completes squat pivot transfer with supervision. Pt completes sink level seated grooming with MOD I. Rn delivers pain medication. Pt propels w/c to ADL apartment with superviison and VC for tight turns. Pt and OT have discussion about home set up bed height and pt gets aggitated and elevates voice d/t frustration that he is not in his own home therefore doesn't feel like he can answer questions about moving furniture, living in ground floor or negotiating stairs. OT provides space for pt to vent and uses therapeutic use of self to allow pt to voice frustrations. Ot  And pt plan to call cousin during afternoon session for discussing home set up. Pt completes regular bed transfer with RW and supervision. Pt completes standing balnace horse shoe toss with S and VC for NWB precautions. Pt completes seated ball toss with 3# wrist weights for BUE strengthening required for transfers and ADLs. Exited sessionw ith pt setaed in bed, exit alarm on and call light in reach  Session 2:  Today's Date: 03/05/2019 OT Individual Time: 8937-3428 OT Individual Time Calculation (min): 57 min   1:1. Pt received inbed ready to take shower. Verbal order for MD allowed to shower without LE brace as long as LE elevated in extension. Pt able to doff clothing iwht dressing stick and CGA for elevating LE. Pt bathes seated with VC for using lateral leans to wash buttock and LHSS to wash B feet. Pt able to don underwear with CGA for threading RLE into pants and use lateral  leans to advance past hips. Pt uses sock aide to don socks and required MOD A to don LE brace. OT switches cervical collar pads with pt in supine. Pt and OT create list of things that may need to be modified for home discharge for pt to discuss with cousin as cousin unable to talk on the phone at this time. Exited sessio nwiht pt setaed EOB eating lunch with call light in reach and exit alarm on   Therapy Documentation Precautions:  Precautions Precaution Comments: RLE hinged knee brace locked in extension Required Braces or Orthoses: Cervical Brace Knee Immobilizer - Right: On at all times Cervical Brace: Hard collar, At all times Other Brace: RLE hinged knee brace locked in extension Restrictions Weight Bearing Restrictions: Yes RLE Weight Bearing: Non weight bearing LLE Weight Bearing: Weight bearing as tolerated General:   Vital Signs:   Pain:   ADL:   Vision   Perception    Praxis   Exercises:   Other Treatments:     Therapy/Group: Individual Therapy  Tonny Branch 03/05/2019, 8:59 AM

## 2019-03-06 LAB — CBC
HCT: 30.9 % — ABNORMAL LOW (ref 39.0–52.0)
Hemoglobin: 9.9 g/dL — ABNORMAL LOW (ref 13.0–17.0)
MCH: 30.6 pg (ref 26.0–34.0)
MCHC: 32 g/dL (ref 30.0–36.0)
MCV: 95.4 fL (ref 80.0–100.0)
Platelets: 542 10*3/uL — ABNORMAL HIGH (ref 150–400)
RBC: 3.24 MIL/uL — ABNORMAL LOW (ref 4.22–5.81)
RDW: 14 % (ref 11.5–15.5)
WBC: 7.2 10*3/uL (ref 4.0–10.5)
nRBC: 0 % (ref 0.0–0.2)

## 2019-03-06 NOTE — Progress Notes (Signed)
[place] PHYSICAL MEDICINE & REHABILITATION PROGRESS NOTE   Subjective/Complaints:   ROS: Patient denies chest pain, shortness of breath, nausea vomiting diarrhea constipation Objective:   No results found. Recent Labs    03/06/19 0012  WBC 7.2  HGB 9.9*  HCT 30.9*  PLT 542*   Recent Labs    03/05/19 0022  NA 137  K 3.9  CL 101  CO2 27  GLUCOSE 93  BUN 13  CREATININE 0.77  CALCIUM 9.3    Intake/Output Summary (Last 24 hours) at 03/06/2019 1042 Last data filed at 03/06/2019 3710 Gross per 24 hour  Intake 1164 ml  Output 1900 ml  Net -736 ml     Physical Exam: Vital Signs Blood pressure 118/80, pulse 73, temperature 98.3 F (36.8 C), temperature source Oral, resp. rate 16, height 6' (1.829 m), weight 63.5 kg, SpO2 100 %. Constitutional: No distress . Vital signs reviewed. HEENT: EOMI, oral membranes moist Neck: supple Cardiovascular: RRR without murmur. No JVD    Respiratory: CTA Bilaterally without wheezes or rales. Normal effort    GI: BS +, non-tender, non-distended  Musculoskeletal:     Comments: RLE>LLE with edema and tenderness. Incisions bilateral knees C/D/I with sutures in place with KI in place   Neurological: more focused, language normal.. He is alert and oriented to person, place, and time.  Speech clear.  Motor: Bilateral upper extremities: 5/5 proximal distal Right lower extremity: Hip flexion 3/5, (with knee in brace), ankle dorsiflexion 5/5 Left lower extremity: Hip flexion, knee extension 4-4+/5, ankle dorsiflexion 5/5 Sensation intact in all 4's--motor/sensory stable Skin:  Scattered abrasions along face, arms, legs. Surgical incision remains C/D/I  Psychiatric: more calm and controlled this morning  Assessment/Plan: 1. 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  Physiatrist is providing close team supervision and 24 hour  management of active medical problems listed below.  Physiatrist and rehab team continue to assess barriers to discharge/monitor patient progress toward functional and medical goals  Care Tool:  Bathing    Body parts bathed by patient: Right arm, Left arm, Chest, Abdomen, Front perineal area, Buttocks, Right upper leg, Left upper leg, Face   Body parts bathed by helper: Right lower leg, Left lower leg     Bathing assist Assist Level: Minimal Assistance - Patient > 75%     Upper Body Dressing/Undressing Upper body dressing   What is the patient wearing?: Pull over shirt    Upper body assist Assist Level: Set up assist    Lower Body Dressing/Undressing Lower body dressing      What is the patient wearing?: Pants, Underwear/pull up     Lower body assist Assist for lower body dressing: Maximal Assistance - Patient 25 - 49%     Toileting Toileting    Toileting assist Assist for toileting: Contact Guard/Touching assist Assistive Device Comment: (urinal within reach)   Transfers Chair/bed transfer  Transfers assist     Chair/bed transfer assist level: Minimal Assistance - Patient > 75%     Locomotion Ambulation   Ambulation assist      Assist level: Supervision/Verbal cueing Assistive device: Walker-rolling Max distance: 35ft   Walk 10 feet activity   Assist     Assist level: Supervision/Verbal cueing Assistive device: Walker-rolling   Walk 50 feet activity   Assist Walk 50 feet with 2 turns activity did not occur: Safety/medical concerns  Assist level: Supervision/Verbal cueing Assistive device: Walker-rolling  Walk 150 feet activity   Assist Walk 150 feet activity did not occur: Safety/medical concerns  Assist level: Supervision/Verbal cueing Assistive device: Walker-rolling    Walk 10 feet on uneven surface  activity   Assist Walk 10 feet on uneven surfaces activity did not occur: Safety/medical concerns          Wheelchair     Assist Will patient use wheelchair at discharge?: Yes Type of Wheelchair: Manual    Wheelchair assist level: Set up assist, Supervision/Verbal cueing Max wheelchair distance: 12850ft    Wheelchair 50 feet with 2 turns activity    Assist        Assist Level: Set up assist, Supervision/Verbal cueing   Wheelchair 150 feet activity     Assist Wheelchair 150 feet activity did not occur: Safety/medical concerns   Assist Level: Supervision/Verbal cueing, Set up assist    Medical Problem List and Plan: 1.  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             SAH with basilar skull fx, right temporal EDH  -right orbital roof/sphenoid sinus fx with tripod fx with zygomatic depression  -right tibial plateau fx--s/p ORIF 6/8---NWB RLE with hinged knee brace, per Ortho, knee orthosis can be removed for showering if knee is kept in extended position, patient appreciated his shower.  He can also do 0 to 30 degrees knee flexion on the right side.  -left knee degloving injury  -C7 TVP fx----cervical collar at all times  -PTX with bilateral 1,2 rib fx's     .-Continue CIR therapies including PT, OT, and SLP  2. RLE DVT/ Antithrombotics: right posterior tib/peroneal dvt -DVT/anticoagulation:  treatment dose lovenox             -antiplatelet therapy: N/A 3. Pain Management: On tylenol 650 mg qid, Robaxin 1000 mg qid, ultram 100 mg qid and Gabapentin 300 mg tid --in addition to oxycodone 10 mg every 4 hours prn.    -reasonable control at present, no change in medications needed 4. Mood:  LCSW to follow for evaluation and support.              -antipsychotic agents: N/A  -environmental mod 5. Neuropsych: This patient is capable of making decisions on his own behalf. 6. Skin/Wound Care: local wound care to incisions, all are clean 7. Fluids/Electrolytes/Nutrition: pt has good appetite   .   8. Vitamin D deficiency: No on 50,000  units weekly with 4,000 units daily.  9. ABLA: hgb 9.8 6/18 10. Tachycardia: HR continues to elevate to 140's intermittently.  Continue low dose BB and monitor for orthostatic changes.               HR remains well controlled, 6/21 Vitals:   03/05/19 1918 03/06/19 0535  BP: 121/75 118/80  Pulse: 80 73  Resp: 16 16  Temp: 98.3 F (36.8 C) 98.3 F (36.8 C)  SpO2: 99% 100%      LOS: 4 days A FACE TO FACE EVALUATION WAS PERFORMED  Erick Colacendrew E Lurie Mullane 03/06/2019, 10:42 AM

## 2019-03-07 ENCOUNTER — Inpatient Hospital Stay (HOSPITAL_COMMUNITY): Payer: Self-pay

## 2019-03-07 ENCOUNTER — Inpatient Hospital Stay (HOSPITAL_COMMUNITY): Payer: Self-pay | Admitting: Physical Therapy

## 2019-03-07 ENCOUNTER — Inpatient Hospital Stay (HOSPITAL_COMMUNITY): Payer: Medicaid Other

## 2019-03-07 LAB — BASIC METABOLIC PANEL
Anion gap: 10 (ref 5–15)
BUN: 12 mg/dL (ref 6–20)
CO2: 28 mmol/L (ref 22–32)
Calcium: 9.6 mg/dL (ref 8.9–10.3)
Chloride: 101 mmol/L (ref 98–111)
Creatinine, Ser: 0.86 mg/dL (ref 0.61–1.24)
GFR calc Af Amer: >60 mL/min (ref >60)
GFR calc non Af Amer: >60 mL/min (ref >60)
Glucose, Bld: 104 mg/dL — ABNORMAL HIGH (ref 70–99)
Potassium: 3.9 mmol/L (ref 3.5–5.1)
Sodium: 139 mmol/L (ref 135–145)

## 2019-03-07 NOTE — Progress Notes (Signed)
Physical Therapy Session Note  Patient Details  Name: Gary Ashley MRN: [id number] Date of Birth: [birth year]  Today's Date: 03/07/2019 PT Individual Time: 3149-7026 and [phone] and 5027-7412 and 8786-7672 PT Individual Time Calculation (min): 24 min and 8 min and 23 min and 56 min  Short Term Goals: Week 1:  PT Short Term Goal 1 (Week 1): STG=LTG due to ELOS  Skilled Therapeutic Interventions/Progress Updates:  Treatment 1: Pt received in bed with c collar loose & head rotated to R with therapist educating him on need to tighten collar and maintain neutral alignment with pt stating "no I loosened it, it was too tight". Pt initially refusing participation 2/2 it being early in the morning but then agreeable after a few minutes. Discussed purpose of PT & need to practice stairs to ensure pt can access bed/bath on 2nd level of home. Pt reports he doesn't need to practice that he will have help once he is home and when he gets on the 2nd level he plans to stay there & becomes irritated with therapist during conversation. Pt presents with either behavioral component & unwilling to comply with therapy or confusion as pt doesn't seem to understand need to be able to negotiate flight of stairs to access bed/bath & remain on 2nd level. Pt also reports he already practiced stairs (pt practiced 4 steps with 2 rails but has full flight with L rail only at home) & pt unable to recognize need to practice stairs in manner that simulates home environment. Pt declined wearing clothes he has in room so therapist provided pt with scrub pants and pt reports willingness to don them but then pt begins making phone call and continues to yell at therapist when therapist attempts to redirect him and encourage him to participate in therapy. Pt left in bed with alarm set & MD entering room, call bell in reach. Pt denies c/o pain.  Treatment 2: Pt requesting therapist return & therapist does so. No c/o pain reported. Pt  somewhat apologetic for behavior earlier and reports "we'll do the steps first thing" during next session.  Therapist assisted pt with donning pants as pt looking for reacher but unable to locate one in room. Therapist instructs pt on need to thread paper scrub pants on RLE first and provides assistance for time management. Pt able to stand with RW & supervision to pull pants over hips. Pt initially reports wanting to transfer to w/c but then reports he will lie down until next session. Therapist educates pt on cervical precautions & need to maintain neutral cervical alignment & to make sure c-collar isn't loose, therapist provides assistance with adjusting collar. Pt left in bed with alarm set & needs at hand.  Treatment 3: Pt received in bed & agreeable to tx, no c/o pain reported. Pt transfers supine<>sitting EOB with mod I and sit<>stand, squat pivot without AD & mod I. Pt propels w/c room<>stairwell with set up assist. Educated pt on most safety when negotiating stairs on bottom with pt very argumentative re stair negotiation with pt ultimately of his own volition, without notifying therapist sits on stairs and bumps up/down with supervision. Pt transferred to/from chair at top of steps with supervision & therapist stabilizing chair. Educated pt on need to have chair at top of steps so he can transition stairs>chair>standing. Pt reports he will negotiate flight of stairs at home in this manner (on his bottom). Educated pt on DME recommendations (18x18 w/c with ELR & RW) and f/u  HHPT with pt voicing agreement. At end of session pt left in bed with alarm set & needs at hand.  Pt continues to require max cuing to maintain neutral alignment of c spine as pt continues to rotate neck L<>R even with cervical collar donned.   Treatment 4: Pt received in bed, initially reporting he is awaiting a phone call but also with inconstent reports of willingness to go to therapy, with pt yelling at therapist and therapist  educating pt on need for mutual respect during therapy sessions & pt more pleasant during remainder of session. Pt transfers OOB to w/c via squat pivot without locking w/c brakes despite education with pt stating "no I'm good". Pt propels w/c around unit with set up assist and completes car transfer to simulate pt riding in backseat of sedan with RW & distant supervision and is able to scoot back to prop RLE on seat. Pt propels w/c over ramp in ortho gym with supervision and cuing for anterior weight shifting and technique for rolling slowly down ramp. Pt returned to room and to bed with mod I sit>supine. Adjusted brace to allow pt to flex knee to 30 degrees ROM (per new orders) & adjusted length of brace straps. Educated pt on proper placement of brace & axis at knee joint. Pt then performed RLE heel slides 3 sets x 15 reps with instructional cuing for technique with task focusing on R knee AROM & strengthening, pt also performs RLE straight leg raises 2 sets x 10 reps for RLE strengthening with cuing for technique. At end of session pt left in bed with alarm set & needs at hand. Pt and therapist both agree after initial portion of session, session went very well and was beneficial.  Pain: pt with c/o pain where stitches were recently removed but nurse reports pt is premedicated.   Therapy Documentation Precautions:  Precautions Precaution Comments: RLE hinged knee brace locked in extension Required Braces or Orthoses: Cervical Brace Knee Immobilizer - Right: On at all times Cervical Brace: Hard collar, At all times Other Brace: RLE hinged knee brace locked in extension Restrictions Weight Bearing Restrictions: Yes RLE Weight Bearing: Non weight bearing LLE Weight Bearing: Weight bearing as tolerated   General: PT Amount of Missed Time (min): 36 Minutes PT Missed Treatment Reason: Patient unwilling to participate   Therapy/Group: Individual Therapy  Sandi MariscalVictoria M Miller 03/07/2019, 3:32 PM

## 2019-03-07 NOTE — Progress Notes (Addendum)
Sutures to right knee and leg removed. Was not able to remove all sutures from left knee, patient complains of constant pain when trying to remove sutures. Also had other nurses to help and attempt to remove sutures. Skin appears to be growing on top of sutures and it has been difficult to remove.  Audie Clear, LPN

## 2019-03-07 NOTE — Progress Notes (Signed)
[place] PHYSICAL MEDICINE & REHABILITATION PROGRESS NOTE   Subjective/Complaints: Focused this morning on some paperwork he needed to get to a family member. Didn't participate with therapy because of this, conversation on phone  ROS: Patient denies fever, rash, sore throat, blurred vision, nausea, vomiting, diarrhea, cough, shortness of breath or chest pain, joint or back pain, headache, or mood change.   Objective:   No results found. Recent Labs    03/06/19 0012  WBC 7.2  HGB 9.9*  HCT 30.9*  PLT 542*   Recent Labs    03/05/19 0022 03/07/19 0829  NA 137 139  K 3.9 3.9  CL 101 101  CO2 27 28  GLUCOSE 93 104*  BUN 13 12  CREATININE 0.77 0.86  CALCIUM 9.3 9.6    Intake/Output Summary (Last 24 hours) at 03/07/2019 0940 Last data filed at 03/07/2019 0742 Gross per 24 hour  Intake 716 ml  Output 1500 ml  Net -784 ml     Physical Exam: Vital Signs Blood pressure 120/76, pulse 61, temperature 98.4 F (36.9 C), resp. rate 17, height 6' (1.829 m), weight 63.5 kg, SpO2 100 %. Constitutional: No distress . Vital signs reviewed. HEENT: EOMI, oral membranes moist Neck: supple Cardiovascular: RRR without murmur. No JVD    Respiratory: CTA Bilaterally without wheezes or rales. Normal effort    GI: BS +, non-tender, non-distended  Musculoskeletal:     Comments: RLE>LLE with edema and tenderness. KB in place Neurological: more focused, language normal.. He is alert and oriented to person, place, and time.  Speech clear.  Motor: Bilateral upper extremities: 5/5 proximal distal Right lower extremity: Hip flexion 3/5, (with knee in brace), ankle dorsiflexion 5/5 Left lower extremity: Hip flexion, knee extension 4-4+/5, ankle dorsiflexion 5/5 Sensation intact in all 4's--motor/sensory stable Skin:  Scattered abrasions along face, arms, legs. Surgical incision remains C/D/I  in both legs  Psychiatric: cooperative, impulsive  Assessment/Plan: 1. Functional deficits  secondary to TBI/polytrauma which require 3+ hours per day of interdisciplinary therapy in a comprehensive inpatient rehab setting.  Physiatrist is providing close team supervision and 24 hour management of active medical problems listed below.  Physiatrist and rehab team continue to assess barriers to discharge/monitor patient progress toward functional and medical goals  Care Tool:  Bathing    Body parts bathed by patient: Right arm, Left arm, Chest, Abdomen, Front perineal area, Buttocks, Right upper leg, Left upper leg, Face   Body parts bathed by helper: Right lower leg, Left lower leg     Bathing assist Assist Level: Minimal Assistance - Patient > 75%     Upper Body Dressing/Undressing Upper body dressing   What is the patient wearing?: Pull over shirt    Upper body assist Assist Level: Set up assist    Lower Body Dressing/Undressing Lower body dressing      What is the patient wearing?: Pants, Underwear/pull up     Lower body assist Assist for lower body dressing: Maximal Assistance - Patient 25 - 49%     Toileting Toileting    Toileting assist Assist for toileting: Contact Guard/Touching assist Assistive Device Comment: (urinal within reach)   Transfers Chair/bed transfer  Transfers assist     Chair/bed transfer assist level: Minimal Assistance - Patient > 75%     Locomotion Ambulation   Ambulation assist      Assist level: Supervision/Verbal cueing Assistive device: Walker-rolling Max distance: 6176ft   Walk 10 feet activity   Assist     Assist  level: Supervision/Verbal cueing Assistive device: Walker-rolling   Walk 50 feet activity   Assist Walk 50 feet with 2 turns activity did not occur: Safety/medical concerns  Assist level: Supervision/Verbal cueing Assistive device: Walker-rolling    Walk 150 feet activity   Assist Walk 150 feet activity did not occur: Safety/medical concerns  Assist level: Supervision/Verbal  cueing Assistive device: Walker-rolling    Walk 10 feet on uneven surface  activity   Assist Walk 10 feet on uneven surfaces activity did not occur: Safety/medical concerns         Wheelchair     Assist Will patient use wheelchair at discharge?: Yes Type of Wheelchair: Manual    Wheelchair assist level: Set up assist, Supervision/Verbal cueing Max wheelchair distance: 118ft    Wheelchair 50 feet with 2 turns activity    Assist        Assist Level: Set up assist, Supervision/Verbal cueing   Wheelchair 150 feet activity     Assist Wheelchair 150 feet activity did not occur: Safety/medical concerns   Assist Level: Supervision/Verbal cueing, Set up assist    Medical Problem List and Plan: 1.  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             SAH with basilar skull fx, right temporal EDH  -right orbital roof/sphenoid sinus fx with tripod fx with zygomatic depression  -right tibial plateau fx--s/p ORIF 6/8---NWB RLE with hinged knee brace,   knee orthosis can be removed for showering if knee is kept in extended position.  He can also do 0 to 30 degrees knee flexion on the right side.  -left knee degloving injury  -C7 TVP fx----cervical collar at all times  -PTX with bilateral 1,2 rib fx's  -expressed to the patient that he will need to be participating in all therapies if he plans on staying here     .  2. RLE DVT/ Antithrombotics: right posterior tib/peroneal dvt -DVT/anticoagulation:  treatment dose lovenox             -antiplatelet therapy: N/A 3. Pain Management: On tylenol 650 mg qid, Robaxin 1000 mg qid, ultram 100 mg qid and Gabapentin 300 mg tid --in addition to oxycodone 10 mg every 4 hours prn.    -reasonable control at present, no change in medications needed 4. Mood:  LCSW to follow for evaluation and support.              -antipsychotic agents: N/A  -environmental mod 5. Neuropsych: This patient is  capable of making decisions on his own behalf. 6. Skin/Wound Care: local wound care to incisions, remain clean 7. Fluids/Electrolytes/Nutrition: pt has good appetite   .   8. Vitamin D deficiency: No on 50,000 units weekly with 4,000 units daily.  9. ABLA: hgb 9.8 6/18 10. Tachycardia: HR continues to elevate to 140's intermittently.  Continue low dose BB and monitor for orthostatic changes.               HR remains well controlled, 6/22 Vitals:   03/06/19 1912 03/07/19 0451  BP: 132/88 120/76  Pulse: 71 61  Resp: 18 17  Temp: 99.3 F (37.4 C) 98.4 F (36.9 C)  SpO2: 98% 100%      LOS: 5 days A FACE TO FACE EVALUATION WAS PERFORMED  Meredith Staggers 03/07/2019, 9:40 AM

## 2019-03-07 NOTE — Progress Notes (Signed)
.  Ortho Trauma Note  Patient doing well this afternoon. Has maintained hinge knee brace locked in full extension on right knee. Progressing well with therapy. Pain well controlled.   Swelling is present to right knee and lower leg but has improved from last week. Improved calf tenderness. Incisions are clean and dry bilaterally. Full ROM of left knee. Less than 30 degrees knee flexion of the right. Motor and sensory intact distally bilaterally. +DP pulse bilaterally  A/P s/p ORIF R tibial plateau  Have ordered follow of XR of right knee. Continue NWB RLE, WBAT LLE. Hinged knee brace to remain locked in full extension at night. Okay for gentle ROM of right knee in brace during day.  Start with 30 degrees and increase as tolerated. Do not bend knee greater than 90 degrees until 4 weeks post op.    Elinda Bunten A. [facility] ?[phone]? (phone)

## 2019-03-07 NOTE — Progress Notes (Signed)
Occupational Therapy Session Note  Patient Details  Name: Gary Ashley MRN: [id number] Date of Birth: [birth year]  Today's Date: 03/07/2019 OT Individual Time: 1100-1155 OT Individual Time Calculation (min): 55 min    Short Term Goals: Week 1:  OT Short Term Goal 1 (Week 1): STG=LTG d/t ELOS  Skilled Therapeutic Interventions/Progress Updates:    Session focused on self care tasks and standing balance. Pt received supine yelling very loudly into phone. Pt ended call within a minute and then recounted the phone call to therapist, still yelling. With cueing, pt was redirected and he calmed down. Pt completed stand pivot transfer into w/c with (S). He sat at sink and completed UB ADLs with mod I. Pt propelled w/c 125 ft to therapy gym with mod I. Pt sat EOM and completed several sit <> stands with cueing for RLE NWB and UE placement, but no physical assist. Discussed shower equipment with pt and need for TTB d/t NWB precautions- pt agreeable. Pt completed standing level balance activity, removing first one and then both UE from RW while completing pipe tree and then functional reaching. Pt reported need for bathroom and returned to his room. Pt initially refused to use BSC over toilet stating "that's how i'm going to do it at home". However, upon providing edu re placing BSC over toilet to avoid having to clean bucket pt agreeable to attempt. Pt completed ~5 ft of functional mobility into bathroom and transferred onto toilet with (S). Pt voided bowels and completed peri hygiene with mod I. Pt returned to supine in bed with LPN present administering medication.   Therapy Documentation Precautions:  Precautions Precaution Comments: RLE hinged knee brace locked in extension Required Braces or Orthoses: Cervical Brace Knee Immobilizer - Right: On at all times Cervical Brace: Hard collar, At all times Other Brace: RLE hinged knee brace locked in extension Restrictions Weight Bearing  Restrictions: Yes RLE Weight Bearing: Non weight bearing LLE Weight Bearing: Weight bearing as tolerated   Therapy/Group: Individual Therapy  Curtis Sites 03/07/2019, 7:25 AM

## 2019-03-07 NOTE — Care Management (Signed)
[facility] Individual Statement of Services  Patient Name:  Gary Ashley  Date:  03/07/2019  Welcome to the [facility].  Our goal is to provide you with an individualized program based on your diagnosis and situation, designed to meet your specific needs.  With this comprehensive rehabilitation program, you will be expected to participate in at least 3 hours of rehabilitation therapies Monday-Friday, with modified therapy programming on the weekends.  Your rehabilitation program will include the following services:  Physical Therapy (PT), Occupational Therapy (OT), Speech Therapy (ST), 24 hour per day rehabilitation nursing, Therapeutic Recreaction (TR), Case Management (Social Worker), Rehabilitation Medicine, Nutrition Services and Pharmacy Services  Weekly team conferences will be held on Tuesdays to discuss your progress.  Your Social Worker will talk with you frequently to get your input and to update you on team discussions.  Team conferences with you and your family in attendance may also be held.  Expected length of stay: 7-10 days   Overall anticipated outcome: modified independent  Depending on your progress and recovery, your program may change. Your Social Worker will coordinate services and will keep you informed of any changes. Your Social Worker's name and contact numbers are listed  below.  The following services may also be recommended but are not provided by the [facility] will be made to provide these services after discharge if needed.  Arrangements include referral to agencies that provide these services.  Your insurance has been verified to be:  None  Your primary doctor is:  None  Pertinent information will be shared with your doctor and your insurance  company.  Social Worker:  [place], [place] or ([phone]   Information discussed with and copy given to patient by: Lennart Pall, 03/07/2019, 4:00 PM

## 2019-03-08 ENCOUNTER — Inpatient Hospital Stay (HOSPITAL_COMMUNITY): Payer: Self-pay

## 2019-03-08 ENCOUNTER — Inpatient Hospital Stay (HOSPITAL_COMMUNITY): Payer: Self-pay | Admitting: Physical Therapy

## 2019-03-08 LAB — BASIC METABOLIC PANEL
Anion gap: 8 (ref 5–15)
BUN: 13 mg/dL (ref 6–20)
CO2: 30 mmol/L (ref 22–32)
Calcium: 9.4 mg/dL (ref 8.9–10.3)
Chloride: 101 mmol/L (ref 98–111)
Creatinine, Ser: 0.85 mg/dL (ref 0.61–1.24)
GFR calc Af Amer: >60 mL/min (ref >60)
GFR calc non Af Amer: >60 mL/min (ref >60)
Glucose, Bld: 86 mg/dL (ref 70–99)
Potassium: 4.2 mmol/L (ref 3.5–5.1)
Sodium: 139 mmol/L (ref 135–145)

## 2019-03-08 MED ORDER — TRAMADOL HCL 50 MG PO TABS
100.0000 mg | ORAL_TABLET | Freq: Three times a day (TID) | ORAL | Status: DC
  Administered 2019-03-08 – 2019-03-09 (×4): 100 mg via ORAL
  Filled 2019-03-08 (×4): qty 2
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
## 2019-03-08 NOTE — Progress Notes (Signed)
All remaining sutures on LLE have been removed. Although anxious, patient tolerated this well. Patient hopeful to return home tomorrow. Stable follow up imaging of right knee from yesterday. Will plan to follow up with patient in OTS clinic 2 weeks after discharge.    Gary Ashley A. [facility] ?[phone]? (phone)

## 2019-03-08 NOTE — Progress Notes (Signed)
Occupational Therapy Discharge Summary  Patient Details  Name: Gary Ashley MRN: [id number] Date of Birth: 20-Sep-1980  Today's Date: 03/08/2019 OT Individual Time: 1030-1120 OT Individual Time Calculation (min): 50 min    Patient has met 9 of 9 long term goals due to improved activity tolerance, improved balance, postural control, ability to compensate for deficits, improved attention, improved awareness and improved coordination.  Patient to discharge at overall Modified Independent level.  [name] care partner is independent to provide the necessary physical assistance at discharge.    Reasons goals not met: All treatment goals met.   Recommendation:  Patient will benefit from ongoing skilled OT services in home health setting to continue to advance functional skills in the area of iADL.  Equipment: TTB and BSC  Reasons for discharge: treatment goals met and discharge from hospital  Patient/family agrees with progress made and goals achieved: Yes   Skilled OT Intervention:  Pt received supine declining participation in any ADLs. Pt completed bed mobility and stand pivot transfer to w/c with mod I. Good adherence to NWB R LE. Pt completed oral care at sink with mod I. Pt propelled w/c 150 ft to therapy gym with mod I. Pt completed tub transfer with TTB use with (S), cueing for use. Pt completed dynamic standing balance activity with BUE coordination component. CGA provided during balance task. Pt completed 3x10 mini squats on LLE to increase balance and LE strength needed during ADL transfers. Pt began c/o high levels of pain in R knee. Pt requesting to rest and to return to bed. Pt propelled w/c back to room and was left supine with ice pack provided to R knee for pain management.   OT Discharge Precautions/Restrictions  Precautions Precautions: Fall Precaution Comments: RLE brace locked in extension, allowed to do 0-30 degrees ROM flexion during day (no more than 90 degrees  flexion until 4 weeks post op) Required Braces or Orthoses: Cervical Brace Knee Immobilizer - Right: On at all times Cervical Brace: Hard collar;At all times Other Brace: RLE hinged knee brace locked in extension Restrictions Weight Bearing Restrictions: Yes RLE Weight Bearing: Non weight bearing LLE Weight Bearing: Weight bearing as tolerated   Pain Pain Assessment Pain Scale: 0-10 Pain Score: 2  ADL ADL Equipment Provided: Dressing stick, Sock aid Eating: Independent Where Assessed-Eating: Edge of bed Grooming: Independent Where Assessed-Grooming: Sitting at sink Upper Body Bathing: Independent Where Assessed-Upper Body Bathing: Sitting at sink Lower Body Bathing: Modified independent Where Assessed-Lower Body Bathing: Sitting at sink Upper Body Dressing: Independent Where Assessed-Upper Body Dressing: Sitting at sink Lower Body Dressing: Modified independent Where Assessed-Lower Body Dressing: Edge of bed Toileting: Modified independent Where Assessed-Toileting: Toilet, Bedside Commode Toilet Transfer: Modified independent Armed forces technical officer Method: Counselling psychologist: Grab bars, Engineer, technical sales Transfer: Distant supervision Tub/Shower Transfer Method: Optometrist: Gaffer Baseline Vision/History: No visual deficits Patient Visual Report: No change from baseline Vision Assessment?: No apparent visual deficits Perception  Perception: Within Functional Limits Praxis Praxis: Intact Cognition Overall Cognitive Status: Within Functional Limits for tasks assessed Arousal/Alertness: Awake/alert Orientation Level: Oriented X4 Attention: Selective Selective Attention: Appears intact Memory: Appears intact Awareness: Appears intact Problem Solving: Appears intact Executive Function: Reasoning Reasoning: Impaired Behaviors: Impulsive;Poor frustration tolerance Safety/Judgment: Impaired Comments: pt will  transfer to w/c without ensuring it's locked at all times Sensation Sensation Light Touch: Appears Intact Proprioception: Appears Intact Coordination Gross Motor Movements are Fluid and Coordinated: No Fine Motor Movements are Fluid and Coordinated: Yes  Coordination and Movement Description: limited by NWB precautions in his R LE Finger Nose Finger Test: [facility] Motor  Motor Motor: Abnormal postural alignment and control Motor - Discharge Observations: generalized deconditioning, pain & weakness Mobility  Bed Mobility Bed Mobility: Supine to Sit;Sit to Supine Rolling Right: Independent with assistive device Rolling Left: Independent with assistive device Supine to Sit: Independent with assistive device Sit to Supine: Independent with assistive device Transfers Sit to Stand: Independent with assistive device Stand to Sit: Independent with assistive device  Trunk/Postural Assessment  Cervical Assessment Cervical Assessment: (cervical collar) Thoracic Assessment Thoracic Assessment: Within Functional Limits Lumbar Assessment Lumbar Assessment: Within Functional Limits Postural Control Postural Control: Deficits on evaluation Righting Reactions: delayed 2/2 NWB RLE Protective Responses: delayed 2/2 NWB RLE  Balance Balance Balance Assessed: Yes Static Sitting Balance Static Sitting - Balance Support: Bilateral upper extremity supported;Feet supported Static Sitting - Level of Assistance: 7: Independent Dynamic Sitting Balance Dynamic Sitting - Balance Support: Feet supported Dynamic Sitting - Level of Assistance: 7: Independent Static Standing Balance Static Standing - Balance Support: During functional activity;Bilateral upper extremity supported Static Standing - Level of Assistance: 6: Modified independent (Device/Increase time) Dynamic Standing Balance Dynamic Standing - Balance Support: Bilateral upper extremity supported;During functional activity Dynamic Standing -  Level of Assistance: 6: Modified independent (Device/Increase time) Extremity/Trunk Assessment RUE Assessment RUE Assessment: Within Functional Limits General Strength Comments: formal test not performed d/t cervical precautions LUE Assessment LUE Assessment: Within Functional Limits General Strength Comments: formal test not performed d/t cervical precautions   Curtis Sites 03/08/2019, 11:07 AM

## 2019-03-08 NOTE — Progress Notes (Signed)
Physical Therapy Discharge Summary  Patient Details  Name: Gary Ashley MRN: [id number] Date of Birth: [birth year]  Today's Date: 03/08/2019    Patient has met 10 of 10 long term goals due to improved activity tolerance, improved balance, improved postural control, increased strength, decreased pain, ability to compensate for deficits, improved attention, improved awareness and improved coordination.  Patient to discharge at a wheelchair level Modified Independent.    Pt is discharging at a mod I level, except supervision for car transfer. Provided pt with handout re: adjusting RLE & pt return demonstrated task. Also provided pt with RLE HEP handout & pt demonstrated ability to perform exercises.  Reasons goals not met: n/a  Recommendation:  Patient will benefit from ongoing skilled PT services in home health setting to continue to advance safe functional mobility, address ongoing impairments in RLE strength, gait, stair negotiation, balance, transfers, safety awareness, endurance, and minimize fall risk.  Equipment: 18x18 w/c with ELR, RW  Reasons for discharge: discharge from hospital  Patient/family agrees with progress made and goals achieved: Yes    PT Discharge Precautions/Restrictions Precautions Precautions: Fall Precaution Comments: RLE brace locked in extension at night, allowed to do 0-30 degrees ROM flexion during day (no more than 90 degrees flexion until 4 weeks post op) Required Braces or Orthoses: Cervical Brace Knee Immobilizer - Right: On at all times Cervical Brace: Hard collar;At all times Restrictions Weight Bearing Restrictions: Yes RLE Weight Bearing: Non weight bearing LLE Weight Bearing: Weight bearing as tolerated  Vision/Perception  No baseline visual deficits, no changes in baseline vision. No apparent visual deficits. Perception WNL.  Cognition Overall Cognitive Status: Within Functional Limits for tasks assessed Arousal/Alertness:  Awake/alert Orientation Level: Oriented X4 Executive Function: Reasoning Reasoning: Impaired Behaviors: Impulsive;Poor frustration tolerance Safety/Judgment: Impaired Comments: pt will transfer to w/c without ensuring it's locked at all times, continues to rotate cervical spine despite education for need to maintain neutral alignment & even with hard cervical collar donned  Sensation Sensation Light Touch: Not tested Proprioception: Appears Intact Coordination Gross Motor Movements are Fluid and Coordinated: No(limited by RLE ROM restrictions) Fine Motor Movements are Fluid and Coordinated: Yes  Motor  Motor Motor: Abnormal postural alignment and control Motor - Discharge Observations: generalized deconditioning, pain & weakness   Mobility Bed Mobility Bed Mobility: Supine to Sit;Sit to Supine Supine to Sit: Independent with assistive device Sit to Supine: Independent with assistive device Transfers Transfers: Sit to Stand;Stand to Sit;Squat Pivot Transfers Sit to Stand: Independent with assistive device Stand to Sit: Independent with assistive device Transfer (Assistive device): Rolling walker  Locomotion  Gait Ambulation: Yes Gait Assistance: Independent with assistive device Gait Distance (Feet): 150 Feet Assistive device: Rolling walker Gait Gait: Yes Gait Pattern: Step-to pattern Gait velocity: decreased Stairs / Additional Locomotion Stairs: Yes Stairs Assistance: Supervision/Verbal cueing Stair Management Technique: Seated/boosting;One rail Left Number of Stairs: 12 Height of Stairs: 6(inches) Product manager Mobility: Yes Wheelchair Assistance: Independent with Camera operator: Both upper extremities Wheelchair Parts Management: (pt is aware of how to manage w/c parts but does not always ensure they're in safe position) Distance: 200  Pt ambulates over ramp with RW & supervision, uneven surface with RW & CGA.  Pt  is able to propel w/c up/down ramp with supervision.   Trunk/Postural Assessment  Cervical Assessment Cervical Assessment: Exceptions to WFL(cervical collar) Thoracic Assessment Thoracic Assessment: Within Functional Limits Lumbar Assessment Lumbar Assessment: Within Functional Limits Postural Control Postural Control: Deficits on evaluation Righting Reactions: delayed  2/2 NWB RLE Protective Responses: delayed 2/2 NWB RLE   Balance Balance Balance Assessed: Yes Dynamic Standing Balance Dynamic Standing - Balance Support: Bilateral upper extremity supported;During functional activity(gait with RW) Dynamic Standing - Level of Assistance: 6: Modified independent (Device/Increase time)  Extremity Assessment  RUE Assessment RUE Assessment: Within Functional Limits General Strength Comments: formal test not performed d/t cervical precautions LUE Assessment LUE Assessment: Within Functional Limits General Strength Comments: formal test not performed d/t cervical precautions  RLE Assessment RLE Assessment: Exceptions to [facility] Active Range of Motion (AROM) Comments: brace locked in full extension at night, can perform 0-30 degrees flexion during the day, no more than 90 degrees flexion until 4 weeks post op General Strength Comments: not formally tested LLE Assessment LLE Assessment: Within Functional Limits    Gary Ashley 03/08/2019, 3:38 PM

## 2019-03-08 NOTE — Progress Notes (Signed)
  Patient ID: BENUEL LY, male   DOB: [birth year], 38 y.o.   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  A walker will not resolve issue with performing activities of daily living.  A wheelchair will allow patient to safely perform daily activities.  Patient is not able to propel themselves in the home using a standard weight wheelchair due to upper extremity weakness and pain .  Patient can self propel in the lightweight wheelchair.   Lauraine Rinne, PA

## 2019-03-08 NOTE — Progress Notes (Signed)
Physical Therapy Session Note  Patient Details  Name: Gary QuinonesChristopher L Ashley MRN: [id number] Date of Birth: 1981/01/16  Today's Date: 03/08/2019 PT Individual Time: [phone] and [id number] and [phone] PT Individual Time Calculation (min): 50 min and 55 min and 39 min  Short Term Goals: Week 1:  PT Short Term Goal 1 (Week 1): STG=LTG due to ELOS  Skilled Therapeutic Interventions/Progress Updates:  Treatment 1: Pt received in bed with covers over head not initially engaging with therapist when shortly after PT arrival Ortho PA arrived to remove pt's LLE sutures. PT returned later & pt agreeable to tx, sitting in w/c; pt pleasant throughout remainder of session. Pt transferred w/c>bed with mod I without AD & sit>supine with mod I. Therapist tightened brace straps & educated pt only to remove it for showering (but ensure R knee remains extended during shower) and to only doff/don it by unclipping it vs moving velcro straps. Educated pt on locking brace in extension at night & unlocking to 30 degrees during the day with pt return demonstrating ability to adjust brace; also provided pt with printed handout of instructions & placed copy in pt's paper chart. Provided pt with LE HEP handouts & reviewed all exercises with pt return demonstrating R ankle ABC circles, RLE straight leg raises, RLE hip abduction slides, RLE heel slides within available range, and LLE single leg bridges with RLE remaining extended. Pt reports some unrated RLE pain with exercises and took rest breaks PRN. At end of session pt left in bed with alarm set & needs at hand. Also educated pt on need to wear hard cervical collar at all times.  Treatment 2: Pt received in bed reporting unrated R knee pain but states he has been icing it & already received pain meds. Pt requires extra time to initiate but transfers to EOB with mod I & pivot to w/c with mod I. Pt propels w/c to dayroom with mod I. Pt engaged in Wii Ty Ty[employer]ling and [employer]ball focusing  on LLE strengthening through sit<>stand, standing balance (pt requires min assist for dynamic balance with LUE support), and LLE strengthening through standing tolerance; pt requires occasional cuing for NWB RLE. Pt reports need to use restroom and propels w/c back to room with cuing for pathfinding. Pt ambulates in bathroom/room with RW & mod I, completing toilet transfer, clothing management & peri hygiene with mod I following continent BM & void. Pt elects to perform hand hygiene from w/c level with mod I. Pt does require occasional cuing for safety, ex: needs to back up all the way to w/c before attempting to sit. Pt returns to bed with mod I and practices locking brace in extension/unlocking it to 30 degrees with pt requiring min cuing only to remind pt to not flex knee past 90 degrees but otherwise pt able to verbalize when & how to adjust brace. Pt left in bed with alarm set & needs at hand.  Treatment 3: Pt received in bed & agreeable to tx. Pt transfers OOB & to w/c with mod I continuing to require min cuing throughout session for safety to lock w/c brakes to ensure it doesn't roll away during transfers but pt with poor carryover & continues to be impulsive. Pt propels w/c around unit with BUE & mod I. Pt completes car transfer to sedan simulated height and backseat to elevate RLE with RW & distant supervision. Pt negotiates ramp with RW & close supervision and uneven mulch with RW & CGA. Pt bumps up/down flight of  steps on his bottom with distant supervision with pt able to recall need to place chair at top of stairs with questioning cuing. Pt ambulates ~150 ft with RW & mod I maintaining NWB RLE. Pt utilizes BUE ergometer from w/c level up to level 3.0 x 4 minutes forwards + 4 minutes backwards with task focusing on BUE strengthening & cardiopulmonary endurance training. At end of session pt left in bed with alarm set & needs at hand.  No c/o pain reported but pt occasionally grimaced & touched RLE  with rest breaks provided PRN.  Educated pt to only apply ice to LE for 20 minutes at a time, then remove it, for pain management.    Therapy Documentation Precautions:  Precautions Precautions: Fall Precaution Comments: RLE brace locked in extension, allowed to do 0-30 degrees ROM flexion during day (no more than 90 degrees flexion until 4 weeks post op) Required Braces or Orthoses: Cervical Brace Knee Immobilizer - Right: On at all times Cervical Brace: Hard collar, At all times Weight Bearing Restrictions: Yes RLE Weight Bearing: Non weight bearing LLE Weight Bearing: Weight bearing as tolerated   General: PT Amount of Missed Time (min): 25 Minutes PT Missed Treatment Reason: (ortho PA removing sutures)  Therapy/Group: Individual Therapy  [name] 03/08/2019, 3:36 PM

## 2019-03-09 LAB — CBC
HCT: 33.9 % — ABNORMAL LOW (ref 39.0–52.0)
HCT: 34.6 % — ABNORMAL LOW (ref 39.0–52.0)
Hemoglobin: 10.8 g/dL — ABNORMAL LOW (ref 13.0–17.0)
Hemoglobin: 11.1 g/dL — ABNORMAL LOW (ref 13.0–17.0)
MCH: 30.1 pg (ref 26.0–34.0)
MCH: 30.5 pg (ref 26.0–34.0)
MCHC: 31.9 g/dL (ref 30.0–36.0)
MCHC: 32.1 g/dL (ref 30.0–36.0)
MCV: 94.4 fL (ref 80.0–100.0)
MCV: 95.1 fL (ref 80.0–100.0)
Platelets: 511 10*3/uL — ABNORMAL HIGH (ref 150–400)
Platelets: 524 10*3/uL — ABNORMAL HIGH (ref 150–400)
RBC: 3.59 MIL/uL — ABNORMAL LOW (ref 4.22–5.81)
RBC: 3.64 MIL/uL — ABNORMAL LOW (ref 4.22–5.81)
RDW: 13.9 % (ref 11.5–15.5)
RDW: 14 % (ref 11.5–15.5)
WBC: 5.9 10*3/uL (ref 4.0–10.5)
WBC: 7.1 10*3/uL (ref 4.0–10.5)
nRBC: 0 % (ref 0.0–0.2)
nRBC: 0 % (ref 0.0–0.2)

## 2019-03-09 MED ORDER — GABAPENTIN 300 MG PO CAPS: 300.0000 mg | ORAL_CAPSULE | Freq: Three times a day (TID) | ORAL | 1 refills | Status: DC

## 2019-03-09 MED ORDER — DOCUSATE SODIUM 100 MG PO CAPS: 100.0000 mg | ORAL_CAPSULE | Freq: Two times a day (BID) | ORAL | 0 refills | Status: DC

## 2019-03-09 MED ORDER — METOPROLOL TARTRATE 25 MG PO TABS: 12.5000 mg | ORAL_TABLET | Freq: Two times a day (BID) | ORAL | 1 refills | Status: DC

## 2019-03-09 MED ORDER — VITAMIN D (ERGOCALCIFEROL) 1.25 MG (50000 UNIT) PO CAPS: 50000.0000 [IU] | ORAL_CAPSULE | ORAL | 1 refills | Status: DC

## 2019-03-09 MED ORDER — TRAMADOL HCL 50 MG PO TABS: 50.0000 mg | ORAL_TABLET | Freq: Four times a day (QID) | ORAL | 1 refills | Status: DC | PRN

## 2019-03-09 MED ORDER — METHOCARBAMOL 500 MG PO TABS: 500.0000 mg | ORAL_TABLET | Freq: Four times a day (QID) | ORAL | 1 refills | Status: DC

## 2019-03-09 MED ORDER — ENOXAPARIN SODIUM 80 MG/0.8ML ~~LOC~~ SOLN: 65.0000 mg | Freq: Two times a day (BID) | SUBCUTANEOUS | 0 refills | Status: DC

## 2019-03-09 MED ORDER — ACETAMINOPHEN 325 MG PO TABS: 650.0000 mg | ORAL_TABLET | Freq: Four times a day (QID) | ORAL | Status: DC

## 2019-03-09 MED ORDER — ENOXAPARIN SODIUM 80 MG/0.8ML ~~LOC~~ SOLN: 65.0000 mg | Freq: Two times a day (BID) | SUBCUTANEOUS | 1 refills | Status: DC

## 2019-03-09 MED ORDER — VITAMIN D 25 MCG (1000 UNIT) PO TABS: 4000.0000 [IU] | ORAL_TABLET | Freq: Every day | ORAL | 1 refills | Status: DC

## 2019-03-09 MED FILL — METOPROLOL TARTRATE 25 MG T: 25 | 30 days supply | Qty: 30 | Fill #0

## 2019-03-09 MED FILL — ENOXAPARIN 80 MG/0.8 ML SYR: 80 | 10 days supply | Qty: 16 | Fill #0

## 2019-03-09 MED FILL — VIT D2 1.25 MG (50,000 UNIT: 1.25 MG | 35 days supply | Qty: 5 | Fill #0

## 2019-03-09 MED FILL — GABAPENTIN 300 MG CAPSULE: 300 | 30 days supply | Qty: 90 | Fill #0

## 2019-03-09 MED FILL — DOK 100 MG CAPS: 100 | 30 days supply | Qty: 60 | Fill #0

## 2019-03-09 MED FILL — traMADol HCL 50 MG TABS: 50 | 5 days supply | Qty: 30 | Fill #0

## 2019-03-09 MED FILL — METHOCARBAMOL 500 MG TABLET: 500 | 20 days supply | Qty: 150 | Fill #0

## 2019-03-09 MED FILL — VITAMIN D3 1,000 UNIT TAB: 25 MCG | 100 days supply | Qty: 100 | Fill #0

## 2019-03-09 NOTE — Discharge Instructions (Signed)
Inpatient Rehab Discharge Instructions  Gary Ashley 03/09/19 Discharge date and time: 03/09/19   Activities/Precautions/ Functional Status: Activity: Right knee brace at all times and nonweightbearing Diet: regular diet Wound Care: keep wound clean and dry    Functional status:  ___ No restrictions     ___ Walk up steps independently _X__ 24/7 supervision/assistance   ___ Walk up steps with assistance ___ Intermittent supervision/assistance  ___ Bathe/dress independently ___ Walk with walker     _ X__ Bathe/dress with assistance ___ Walk Independently    ___ Shower independently ___ Walk with assistance    ___ Shower with assistance X__ No alcohol     ___ Return to work/school ________     COMMUNITY REFERRALS UPON DISCHARGE:    Home Health:   PT     OT                       Agency:  [facility] Phone: [phone]   Medical Equipment/Items Ordered:  Wheelchair, walker, 3n1 and tub bench                                                      Agency/Supplier:  [facility] @ [phone]    Special Instructions: 1. No driving,  smoking or alcohol use. 2. Keep collar on at all times. 3. Can remove brace for showers but you have to keep knee extended at all times (cannot bend it). Continue non-weight bearing on the knee till cleared by surgeon.      My questions have been answered and I understand these instructions. I will adhere to these goals and the provided educational materials after my discharge from the hospital.  Patient/Caregiver Signature _______________________________ Date __________  Clinician Signature _______________________________________ Date __________  Please bring this form and your medication list with you to all your follow-up doctor's appointments.

## 2019-03-09 NOTE — Progress Notes (Signed)
Social Work  Discharge Note  The overall goal for the admission was met for:   Discharge location: Yes - home with friend/ cousin  Length of Stay: Yes - 7 days  Discharge activity level: Yes - mod ind - supervision  Home/community participation: Yes  Services provided included: MD, RD, PT, OT, SLP, RN, TR, Pharmacy and [place]: Uninsured;  Financial counseling met with pt to begin Medicaid and SSD applications  Follow-up services arranged: Home Health: PT, OT via [facility], DME: 18x18 lightweight w/c with ELRs, cushion, rolling walker, 3n1 commode and tub bench via [facility] and Patient/Family has no preference for HH/DME agencies  Comments (or additional information):       Contact info:  [name] @ [phone]  Patient/Family verbalized understanding of follow-up arrangements: Yes  Individual responsible for coordination of the follow-up plan: pt  Confirmed correct DME delivered: Gary Ashley 03/09/2019    Chou Busler
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
## 2019-03-09 NOTE — Progress Notes (Signed)
Patient received discharge instructions from Algis Liming, PA-C with verbal understanding. Patient received prescription medications from [facility] at discharge. Patient discharged to home with patient belongings, equipment, and prescriptions.
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
## 2019-03-11 ENCOUNTER — Telehealth: Payer: Self-pay | Admitting: Registered Nurse

## 2019-03-11 ENCOUNTER — Telehealth: Payer: Self-pay

## 2019-03-11 NOTE — Telephone Encounter (Signed)
Transitional Care call  Patient name: Gary Ashley  DOB: Oct 18, 1980 1. Are you/is patient experiencing any problems since coming home? No a. Are there any questions regarding any aspect of care? No 2. Are there any questions regarding medications administration/dosing? No a. Are meds being taken as prescribed? Yes b. "Patient should review meds with caller to confirm" Medication List Reviewed 3. Have there been any falls? No 4. Has Home Health been to the house and/or have they contacted you? Yes, [facility] a. If not, have you tried to contact them? See Above b. Can we help you contact them? NA 5. Are bowels and bladder emptying properly? Yes a. Are there any unexpected incontinence issues? No b. If applicable, is patient following bowel/bladder programs? NA 6. Any fevers, problems with breathing, unexpected pain? No 7. Are there any skin problems or new areas of breakdown? No 8. Has the patient/family member arranged specialty MD follow up (ie cardiology/neurology/renal/surgical/etc.)?  HFU appointments are scheduled he reports.  a. Can we help arrange? NA 9. Does the patient need any other services or support that we can help arrange? No 10. Are caregivers following through as expected in assisting the patient? Yes 11. Has the patient quit smoking, drinking alcohol, or using drugs as recommended? [name] states he doesn't smoke, drink alcohol or use illicit drugs.   Appointment date/time 03/15/2019  arrival time 1:00 for 1:20 appointment with Bayard Hugger ANP-C. At [place]

## 2019-03-11 NOTE — Telephone Encounter (Signed)
Patient called stating he wanted to speak with Dr. Naaman Plummer. I called patient back and he stated PT came out in the am and called back at 12:00 stating he would need to find another home health agency. Patient stated to me that he was asking the PT with assisting him in getting his tub bench in the shower and pt stated it didn't fit.Among other things to do for him and patient states PT was hesitant to do so. I stated to patient that people have job description and what you are asking of PT may not be in his job description. It sounds like to me that is OT job description. I told him we will get this resolved.  I called [facility] and spoke to the [facility]. Chelsea stated that the patient was argumentative and making the PT feel uncomfortable. She states she will reach out to the patient and explain to him what PT and OT does to see if he wants to continue therapy with [facility].

## 2019-03-15 ENCOUNTER — Other Ambulatory Visit: Payer: Self-pay

## 2019-03-15 ENCOUNTER — Encounter: Payer: Medicaid Other | Attending: Registered Nurse | Admitting: Registered Nurse

## 2019-03-15 ENCOUNTER — Encounter: Payer: Self-pay | Admitting: Registered Nurse

## 2019-03-15 VITALS — BP 141/90 | HR 77 | Temp 97.6°F

## 2019-03-15 DIAGNOSIS — S82141A Displaced bicondylar fracture of right tibia, initial encounter for closed fracture: Secondary | ICD-10-CM

## 2019-03-15 DIAGNOSIS — T1490XA Injury, unspecified, initial encounter: Secondary | ICD-10-CM | POA: Insufficient documentation

## 2019-03-15 DIAGNOSIS — G8918 Other acute postprocedural pain: Secondary | ICD-10-CM | POA: Insufficient documentation

## 2019-03-15 DIAGNOSIS — S12601A Unspecified nondisplaced fracture of seventh cervical vertebra, initial encounter for closed fracture: Secondary | ICD-10-CM

## 2019-03-15 MED ORDER — TRAMADOL HCL 50 MG PO TABS: 50.0000 mg | ORAL_TABLET | Freq: Four times a day (QID) | ORAL | 1 refills | Status: DC | PRN
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
## 2019-03-16 ENCOUNTER — Ambulatory Visit: Payer: Self-pay | Attending: Nurse Practitioner | Admitting: Nurse Practitioner

## 2019-03-16 ENCOUNTER — Telehealth: Payer: Self-pay | Admitting: Registered Nurse

## 2019-03-16 ENCOUNTER — Encounter: Payer: Self-pay | Admitting: Nurse Practitioner

## 2019-03-16 ENCOUNTER — Encounter: Payer: Self-pay | Admitting: Registered Nurse

## 2019-03-16 DIAGNOSIS — Z09 Encounter for follow-up examination after completed treatment for conditions other than malignant neoplasm: Secondary | ICD-10-CM

## 2019-03-16 DIAGNOSIS — S14107D Unspecified injury at C7 level of cervical spinal cord, subsequent encounter: Secondary | ICD-10-CM

## 2019-03-16 MED ORDER — TRAMADOL HCL 50 MG PO TABS: 50.0000 mg | ORAL_TABLET | Freq: Four times a day (QID) | ORAL | 1 refills | Status: DC | PRN

## 2019-03-16 MED FILL — traMADol HCL 50 MG TABS: 50 | 15 days supply | Qty: 120 | Fill #0

## 2019-03-16 NOTE — Telephone Encounter (Signed)
Received staff message from Ms. Geryl Rankins NP today, she had a visit with Mr. Gary Ashley today. Tramadol e-scribed to [facility].
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
## 2019-03-17 ENCOUNTER — Telehealth: Payer: Self-pay

## 2019-03-17 NOTE — Telephone Encounter (Signed)
Patient called and left message stating needing a refill of vitamin D medication and metoprolol medication as well.   Called him back and let him know that those meds needs to be refilled and managed by his primary care provider but I will send them a message letting them know that you needed refills.

## 2019-03-18 ENCOUNTER — Encounter: Payer: Self-pay | Admitting: Nurse Practitioner

## 2019-03-19 ENCOUNTER — Other Ambulatory Visit: Payer: Self-pay | Admitting: Nurse Practitioner

## 2019-03-19 NOTE — Telephone Encounter (Signed)
Bien please verify that patient is taking vitamin d once a week and not daily. Also he has prescriptions that were [facility] to [facility]. Do those need to be mailed to patient? Can we find out from Opal Sidles about the [facility] pharmacy. He had a script sent on 03-09-2019 as well as an additional refill.

## 2019-03-21 ENCOUNTER — Telehealth: Payer: Self-pay | Admitting: Nurse Practitioner

## 2019-03-21 NOTE — Telephone Encounter (Signed)
1) Medication(s) Requested (by name): Metoprolol Vitamin d Injection needles   2) Pharmacy of Choice: [facility] [place] [place] 3) Special Requests:   Approved medications will be sent to the pharmacy, we will reach out if there is an issue.  Requests made after 3pm may not be addressed until the following business day!  If a patient is unsure of the name of the medication(s) please note and ask patient to call back when they are able to provide all info, do not send to responsible party until all information is available!

## 2019-03-22 ENCOUNTER — Other Ambulatory Visit: Payer: Self-pay | Admitting: Student

## 2019-03-22 ENCOUNTER — Encounter: Payer: Self-pay | Admitting: Registered Nurse

## 2019-03-22 DIAGNOSIS — M79651 Pain in right thigh: Secondary | ICD-10-CM | POA: Diagnosis not present

## 2019-03-22 DIAGNOSIS — S82141D Displaced bicondylar fracture of right tibia, subsequent encounter for closed fracture with routine healing: Secondary | ICD-10-CM | POA: Diagnosis not present

## 2019-03-22 DIAGNOSIS — S82151D Displaced fracture of right tibial tuberosity, subsequent encounter for closed fracture with routine healing: Secondary | ICD-10-CM | POA: Diagnosis not present

## 2019-03-22 NOTE — Telephone Encounter (Signed)
Patient called stating he needs more lovenox syringes but pharmacy states they dont have the prescription and need it resent. Please follow up

## 2019-03-22 NOTE — Telephone Encounter (Signed)
Based on when these were written the patient shouldn't be out of metoprolol or Vit D, we need to confirm that he is taking them as directed.  I believe the 'injection needles' are the lovenox syringes which the patient has refills on at the [facility].   Can you reach out to him please?

## 2019-03-23 ENCOUNTER — Other Ambulatory Visit: Payer: Self-pay | Admitting: Student

## 2019-03-23 ENCOUNTER — Other Ambulatory Visit: Payer: Self-pay | Admitting: Nurse Practitioner

## 2019-03-23 MED ORDER — ENOXAPARIN SODIUM 80 MG/0.8ML ~~LOC~~ SOLN: 65.0000 mg | Freq: Two times a day (BID) | SUBCUTANEOUS | 0 refills | Status: DC

## 2019-03-23 MED FILL — ENOXAPARIN 80 MG/0.8 ML SYR: 80 | 20 days supply | Qty: 32 | Fill #0

## 2019-03-23 MED FILL — VIT D2 1.25 MG (50,000 UNIT: 1.25 MG | 35 days supply | Qty: 5 | Fill #0

## 2019-03-23 NOTE — Progress Notes (Unsigned)
[facility] on 03/22/2019. States he had two doses of lovenox left. Have sent refill order for 20 day supply of Lovenox 80mg /0.8 mL for patient to [facility] [place]. Patient will need to follow up with [facility] to determine if additional therapy is necessary for treatment of DVT.     Che Below A. Carmie Kanner [facility] ?[phone]? (phone)

## 2019-03-24 DIAGNOSIS — S0240CA Maxillary fracture, right side, initial encounter for closed fracture: Secondary | ICD-10-CM | POA: Insufficient documentation

## 2019-03-24 DIAGNOSIS — S01311A Laceration without foreign body of right ear, initial encounter: Secondary | ICD-10-CM | POA: Insufficient documentation

## 2019-03-24 NOTE — Telephone Encounter (Signed)
CMA spoke to patient. Pt. Verified he is taking Vitamin D weekly and Vitamin D3 daily. Pt. Stated he is doing the Vitamin D weekly and not daily.   Pt. Is aware he have lab appt and upcoming appt with PCP.

## 2019-03-25 NOTE — Telephone Encounter (Signed)
RF was sent on 03/23/2019

## 2019-03-28 ENCOUNTER — Telehealth: Payer: Self-pay | Admitting: *Deleted

## 2019-03-28 NOTE — Telephone Encounter (Signed)
Please let patient know he only needs one or the other. Not both of the vitamin D2 and D3. I recommend he take the weekly vitamin D that I prescribed for him. He will need to take it until further notice.

## 2019-03-28 NOTE — Telephone Encounter (Signed)
Patient verified DOB Patient would like a handicap card completed and will fill out patient portion on Wednesday when he comes for blood work. Patient is aware of it being placed in PCP box for determination and completion.

## 2019-03-28 NOTE — Telephone Encounter (Signed)
Pt name and DOB verified. Patient informed of message from PCP.  He request a handicap placard. Please let him know when complete so he can pick up from the office.

## 2019-03-30 ENCOUNTER — Other Ambulatory Visit: Payer: Self-pay | Admitting: Nurse Practitioner

## 2019-03-30 ENCOUNTER — Other Ambulatory Visit: Payer: Self-pay

## 2019-03-30 DIAGNOSIS — R7309 Other abnormal glucose: Secondary | ICD-10-CM

## 2019-03-30 DIAGNOSIS — Z13220 Encounter for screening for lipoid disorders: Secondary | ICD-10-CM

## 2019-03-30 DIAGNOSIS — D649 Anemia, unspecified: Secondary | ICD-10-CM

## 2019-04-01 ENCOUNTER — Ambulatory Visit: Payer: Self-pay | Attending: Family Medicine

## 2019-04-01 ENCOUNTER — Other Ambulatory Visit: Payer: Self-pay

## 2019-04-01 NOTE — Telephone Encounter (Signed)
CMA spoke to patient and inform his Handicap placard is ready for him to pick up.  Patient understood.

## 2019-04-02 LAB — BASIC METABOLIC PANEL
BUN/Creatinine Ratio: 10 (ref 9–20)
BUN: 10 mg/dL (ref 6–20)
CO2: 23 mmol/L (ref 20–29)
Calcium: 9.8 mg/dL (ref 8.7–10.2)
Chloride: 101 mmol/L (ref 96–106)
Creatinine, Ser: 0.96 mg/dL (ref 0.76–1.27)
GFR calc Af Amer: 115 mL/min/{1.73_m2} (ref >59)
GFR calc non Af Amer: 99 mL/min/{1.73_m2} (ref >59)
Glucose: 102 mg/dL — ABNORMAL HIGH (ref 65–99)
Potassium: 4 mmol/L (ref 3.5–5.2)
Sodium: 141 mmol/L (ref 134–144)

## 2019-04-02 LAB — LIPID PANEL
Chol/HDL Ratio: 2.9 ratio (ref 0.0–5.0)
Cholesterol, Total: 157 mg/dL (ref 100–199)
HDL: 54 mg/dL (ref >39)
LDL Calculated: 88 mg/dL (ref 0–99)
Triglycerides: 77 mg/dL (ref 0–149)
VLDL Cholesterol Cal: 15 mg/dL (ref 5–40)

## 2019-04-02 LAB — CBC
Hematocrit: 42.8 % (ref 37.5–51.0)
Hemoglobin: 13.4 g/dL (ref 13.0–17.7)
MCH: 29.8 pg (ref 26.6–33.0)
MCHC: 31.3 g/dL — ABNORMAL LOW (ref 31.5–35.7)
MCV: 95 fL (ref 79–97)
Platelets: 303 10*3/uL (ref 150–450)
RBC: 4.49 x10E6/uL (ref 4.14–5.80)
RDW: 13.6 % (ref 11.6–15.4)
WBC: 6.5 10*3/uL (ref 3.4–10.8)

## 2019-04-04 MED FILL — METOPROLOL TARTRATE 25 MG T: 25 | 30 days supply | Qty: 30 | Fill #0

## 2019-04-05 ENCOUNTER — Telehealth: Payer: Self-pay

## 2019-04-05 NOTE — Telephone Encounter (Signed)
CMA attempt to reach patient to inform on lab results.  No answer and left a VM for a call back.  

## 2019-04-05 NOTE — Telephone Encounter (Signed)
Call placed to patient [phone]  in attempt to acquire additional information for his SCAT application. Message left requesting a call back to this CM # [phone]

## 2019-04-05 NOTE — Telephone Encounter (Signed)
-----   Message from Gildardo Pounds, NP sent at 04/03/2019 10:28 AM EDT ----- Labs do not show anemia at this time.Will continue to monitor. Kidney function, glucose and electrolytes are normal. Cholesterol levels are normal.

## 2019-04-05 NOTE — Telephone Encounter (Signed)
Call received from patient. SCAT application completed and faxed to SCAT eligibility.

## 2019-04-06 NOTE — Telephone Encounter (Signed)
Pt returning call for lab results  

## 2019-04-07 NOTE — Telephone Encounter (Signed)
Pt returned call for lab results. Pt says has phone ringer on and sitting on couch with him and will for at least 2 hours.

## 2019-04-07 NOTE — Telephone Encounter (Signed)
CMA called back patient and inform on lab results.  Pt. Understood.

## 2019-04-08 MED FILL — GABAPENTIN 300 MG CAPSULE: 300 | 30 days supply | Qty: 90 | Fill #0

## 2019-04-08 MED FILL — VIT D2 1.25 MG (50,000 UNIT: 1.25 MG | 35 days supply | Qty: 5 | Fill #0

## 2019-04-13 ENCOUNTER — Other Ambulatory Visit: Payer: Self-pay

## 2019-04-13 ENCOUNTER — Encounter: Payer: Self-pay | Admitting: Registered Nurse

## 2019-04-13 ENCOUNTER — Encounter: Payer: Medicaid Other | Attending: Registered Nurse | Admitting: Registered Nurse

## 2019-04-13 VITALS — BP 142/90 | HR 60 | Temp 98.1°F | Ht 72.0 in

## 2019-04-13 DIAGNOSIS — G8918 Other acute postprocedural pain: Secondary | ICD-10-CM | POA: Diagnosis not present

## 2019-04-13 DIAGNOSIS — Z79899 Other long term (current) drug therapy: Secondary | ICD-10-CM

## 2019-04-13 DIAGNOSIS — Z5181 Encounter for therapeutic drug level monitoring: Secondary | ICD-10-CM | POA: Diagnosis not present

## 2019-04-13 DIAGNOSIS — T1490XA Injury, unspecified, initial encounter: Secondary | ICD-10-CM | POA: Diagnosis not present

## 2019-04-13 DIAGNOSIS — S82141A Displaced bicondylar fracture of right tibia, initial encounter for closed fracture: Secondary | ICD-10-CM | POA: Diagnosis not present

## 2019-04-13 MED ORDER — TRAMADOL HCL 50 MG PO TABS: 50.0000 mg | ORAL_TABLET | Freq: Four times a day (QID) | ORAL | 1 refills | Status: DC | PRN

## 2019-04-13 MED FILL — traMADol HCL 50 MG TABS: 50 | 15 days supply | Qty: 120 | Fill #0

## 2019-04-13 NOTE — Progress Notes (Signed)
Subjective:    Patient ID: Gary Ashley, male    DOB: 06-Aug-1981, 38 y.o.   MRN: [id number]  HPI: Gary Ashley is a 38 y.o. male who returns for follow up appointment for chronic pain and medication refill. He states his pain is located in his bilateral shoulders and right lower extremity pain. He rates his pain 7. His current exercise regime is walking and performing stretching exercises.  Gary Ashley Morphine equivalent is  40.00 MME. Oral Swab Performed today. Narcotic Contract Signed Today.    Pain Inventory Average Pain 8 Pain Right Now 7 My pain is stabbing and aching  In the last 24 hours, has pain interfered with the following? General activity 7 Relation with others 7 Enjoyment of life 8 What TIME of day is your pain at its worst? evening Sleep (in general) Fair  Pain is worse with: walking, bending, standing and some activites Pain improves with: n/a Relief from Meds: n/a  Mobility use a walker ability to climb steps?  no do you drive?  no use a wheelchair  Function Do you have any goals in this area?  no  Neuro/Psych No problems in this area  Prior Studies no  Physicians involved in your care no   Family History  Problem Relation Age of Onset  . Healthy Mother   . Healthy Father    Social History   Socioeconomic History  . Marital status: Single    Spouse name: Not on file  . Number of children: Not on file  . Years of education: Not on file  . Highest education level: Not on file  Occupational History  . Not on file  Social Needs  . Financial resource strain: Not on file  . Food insecurity    Worry: Not on file    Inability: Not on file  . Transportation needs    Medical: Not on file    Non-medical: Not on file  Tobacco Use  . Smoking status: Current Some Day Smoker    Types: Cigarettes  . Smokeless tobacco: Never Used  Substance and Sexual Activity  . Alcohol use: Not Currently  . Drug use: Not Currently    Types:  Marijuana  . Sexual activity: Yes  Lifestyle  . Physical activity    Days per week: Not on file    Minutes per session: Not on file  . Stress: Not on file  Relationships  . Social Herbalist on phone: Not on file    Gets together: Not on file    Attends religious service: Not on file    Active member of club or organization: Not on file    Attends meetings of clubs or organizations: Not on file    Relationship status: Not on file  Other Topics Concern  . Not on file  Social History Narrative  . Not on file   Past Surgical History:  Procedure Laterality Date  . APPENDECTOMY    . EXTERNAL FIXATION LEG Bilateral 02/18/2019   Procedure: EXTERNAL FIXATION RIGHT LEG, I&D LEFT LEG;  Surgeon: Shona Needles, MD;  Location: [place];  Service: Orthopedics;  Laterality: Bilateral;  . ORIF TIBIA PLATEAU Right 02/21/2019   Procedure: OPEN REDUCTION INTERNAL FIXATION (ORIF) TIBIAL PLATEAU;  Surgeon: Shona Needles, MD;  Location: [place];  Service: Orthopedics;  Laterality: Right;   Past Medical History:  Diagnosis Date  . Bilateral pneumothorax   . C7 cervical fracture ([place])   . SAH (  subarachnoid hemorrhage) (HCC)   . Sinus tachycardia   . TBI (traumatic brain injury) (HCC)   . Vitamin D deficiency    There were no vitals taken for this visit.  Opioid Risk Score:   Fall Risk Score:  `1  Depression screen PHQ 2/9  Depression screen [facility] 2/9 03/16/2019 03/15/2019  Decreased Interest 3 3  Down, Depressed, Hopeless 3 3  PHQ - 2 Score 6 6  Altered sleeping 3 3  Tired, decreased energy 0 3  Change in appetite 0 0  Feeling bad or failure about yourself  3 3  Trouble concentrating 3 3  Moving slowly or fidgety/restless 0 3  Suicidal thoughts 0 0  PHQ-9 Score 15 21      Review of Systems  Constitutional:       Night sweats  Respiratory: Positive for shortness of breath.   Cardiovascular:       Limb swelling  All other systems reviewed and are negative.      Objective:    Physical Exam Vitals signs and nursing note reviewed.  Constitutional:      Appearance: Normal appearance.  Neck:     Musculoskeletal: Normal range of motion and neck supple.  Cardiovascular:     Rate and Rhythm: Normal rate and regular rhythm.     Pulses: Normal pulses.     Heart sounds: Normal heart sounds.  Pulmonary:     Effort: Pulmonary effort is normal.     Breath sounds: Normal breath sounds.  Musculoskeletal:     Comments: Normal Muscle Bulk and Muscle Testing Reveals:  Upper Extremities: Full ROM and Muscle Strength 5/5 Left AC Joint Tenderness  Thoracic Paraspinal Tenderness: T7-T-9 Lower Extremities: Right: Decreased ROM and Muscle Strength 4/5 Left: Full ROM and Muscle Strength 5/5 Arises from Table Slowly using Walker for support Antalgic Gait   Skin:    General: Skin is warm and dry.  Neurological:     Mental Status: He is alert and oriented to person, place, and time.  Psychiatric:        Mood and Affect: Mood normal.        Behavior: Behavior normal.           Assessment & Plan:  1. Trauma: Continue HEP. Continue to Monitor. 04/13/2019 2. Closed Bicondylar Fracture of Right Tibial Plateau: Orthopedic Following. Continue to Monitor. 04/13/2019 3. Cervical Fracture ( C7): Wearing Cervical Collar: F/U with Neurosurgery. He verbalizes understanding. 04/13/2019 4. Rib Fractures: Continue HEP as tolerated and Continue to Monitor. 04/13/2019  15 minutes of face to face patient care time was spent during this visit. All questions were encouraged and answered.  F/U in 1 month

## 2019-04-15 ENCOUNTER — Ambulatory Visit: Payer: Self-pay | Admitting: Nurse Practitioner

## 2019-04-15 MED FILL — METOPROLOL TARTRATE 25 MG T: 25 | 30 days supply | Qty: 30 | Fill #0

## 2019-04-19 MED FILL — METHOCARBAMOL 500 MG TABS: 500 | 18 days supply | Qty: 150 | Fill #0

## 2019-04-20 LAB — DRUG TOX MONITOR 1 W/CONF, ORAL FLD
Amphetamines: NEGATIVE ng/mL (ref <10)
Barbiturates: NEGATIVE ng/mL (ref <10)
Benzodiazepines: NEGATIVE ng/mL (ref <0.50)
Benzoylecgonine: 6.5 ng/mL — ABNORMAL HIGH (ref <5.0)
Buprenorphine: NEGATIVE ng/mL (ref <0.10)
Cocaine: 18.3 ng/mL — ABNORMAL HIGH (ref <5.0)
Cocaine: POSITIVE ng/mL — AB (ref <5.0)
Cotinine: 232 ng/mL — ABNORMAL HIGH (ref <5.0)
Fentanyl: NEGATIVE ng/mL (ref <0.10)
Heroin Metabolite: NEGATIVE ng/mL (ref <1.0)
MARIJUANA: POSITIVE ng/mL — AB (ref <2.5)
MDMA: NEGATIVE ng/mL (ref <10)
Meprobamate: NEGATIVE ng/mL (ref <2.5)
Methadone: NEGATIVE ng/mL (ref <5.0)
Nicotine Metabolite: POSITIVE ng/mL — AB (ref <5.0)
Opiates: NEGATIVE ng/mL (ref <2.5)
Phencyclidine: NEGATIVE ng/mL (ref <10)
THC: 59.3 ng/mL — ABNORMAL HIGH (ref <2.5)
Tapentadol: NEGATIVE ng/mL (ref <5.0)
Tramadol: >500 ng/mL — ABNORMAL HIGH (ref <5.0)
Tramadol: POSITIVE ng/mL — AB (ref <5.0)
Zolpidem: NEGATIVE ng/mL (ref <5.0)

## 2019-04-20 LAB — DRUG TOX ALC METAB W/CON, ORAL FLD: Alcohol Metabolite: NEGATIVE ng/mL (ref <25)

## 2019-04-21 ENCOUNTER — Telehealth: Payer: Self-pay

## 2019-04-21 ENCOUNTER — Telehealth: Payer: Self-pay | Admitting: *Deleted

## 2019-04-21 NOTE — Telephone Encounter (Signed)
As per Gary Ashley, she has received the application for services and it is under review.

## 2019-04-21 NOTE — Telephone Encounter (Signed)
Non-narcotic only. Also will not rx any muscle relaxants.

## 2019-04-21 NOTE — Telephone Encounter (Signed)
Oral swab drug screen was positive for cocaine and metabolites and marijuana and metabolites as well as Tramadol. Please advise.

## 2019-04-22 ENCOUNTER — Telehealth: Payer: Self-pay

## 2019-04-22 NOTE — Telephone Encounter (Signed)
Patient called stating he is returning a call.  

## 2019-04-22 NOTE — Telephone Encounter (Signed)
Placed a call to Mr. Gary Ashley, no answer. Left message to return the call.

## 2019-04-22 NOTE — Telephone Encounter (Signed)
Placed a call to [facility] to discontinue Tramadol. Placed a call to [facility], no refills on Robaxin. Placed a call to Mr. Breeze regarding UDS, no answer. Left message to return call.

## 2019-04-26 DIAGNOSIS — S82151D Displaced fracture of right tibial tuberosity, subsequent encounter for closed fracture with routine healing: Secondary | ICD-10-CM | POA: Diagnosis not present

## 2019-04-26 DIAGNOSIS — S82141D Displaced bicondylar fracture of right tibia, subsequent encounter for closed fracture with routine healing: Secondary | ICD-10-CM | POA: Diagnosis not present

## 2019-04-26 DIAGNOSIS — M79651 Pain in right thigh: Secondary | ICD-10-CM | POA: Diagnosis not present

## 2019-05-03 MED FILL — METHOCARBAMOL 500 MG TABS: 500 | 18 days supply | Qty: 150 | Fill #0

## 2019-05-09 ENCOUNTER — Other Ambulatory Visit: Payer: Self-pay | Admitting: Nurse Practitioner

## 2019-05-10 ENCOUNTER — Encounter: Payer: Self-pay | Admitting: Nurse Practitioner

## 2019-05-10 ENCOUNTER — Ambulatory Visit: Payer: Medicaid Other | Attending: Nurse Practitioner | Admitting: Nurse Practitioner

## 2019-05-10 DIAGNOSIS — E559 Vitamin D deficiency, unspecified: Secondary | ICD-10-CM

## 2019-05-10 DIAGNOSIS — R000 Tachycardia, unspecified: Secondary | ICD-10-CM

## 2019-05-10 DIAGNOSIS — R350 Frequency of micturition: Secondary | ICD-10-CM

## 2019-05-10 DIAGNOSIS — G8918 Other acute postprocedural pain: Secondary | ICD-10-CM

## 2019-05-10 MED ORDER — METOPROLOL TARTRATE 25 MG PO TABS: 12.5000 mg | ORAL_TABLET | Freq: Two times a day (BID) | ORAL | 1 refills | Status: DC

## 2019-05-10 MED ORDER — GABAPENTIN 300 MG PO CAPS: 300.0000 mg | ORAL_CAPSULE | Freq: Three times a day (TID) | ORAL | 0 refills | Status: DC

## 2019-05-10 MED ORDER — VITAMIN D 25 MCG (1000 UNIT) PO TABS: 4000.0000 [IU] | ORAL_TABLET | Freq: Every day | ORAL | 1 refills | Status: DC
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
## 2019-05-12 MED FILL — METOPROLOL TARTRATE 25 MG T: 25 | 30 days supply | Qty: 30 | Fill #0

## 2019-05-12 MED FILL — GABAPENTIN 300 MG CAPSULE: 300 | 30 days supply | Qty: 90 | Fill #0

## 2019-05-16 ENCOUNTER — Other Ambulatory Visit: Payer: Self-pay

## 2019-05-18 ENCOUNTER — Encounter: Payer: Medicaid Other | Attending: Registered Nurse | Admitting: Physical Medicine & Rehabilitation

## 2019-05-18 DIAGNOSIS — T1490XA Injury, unspecified, initial encounter: Secondary | ICD-10-CM | POA: Insufficient documentation

## 2019-05-18 DIAGNOSIS — S82141A Displaced bicondylar fracture of right tibia, initial encounter for closed fracture: Secondary | ICD-10-CM | POA: Insufficient documentation

## 2019-05-18 DIAGNOSIS — G8918 Other acute postprocedural pain: Secondary | ICD-10-CM | POA: Insufficient documentation

## 2019-05-24 DIAGNOSIS — S82141D Displaced bicondylar fracture of right tibia, subsequent encounter for closed fracture with routine healing: Secondary | ICD-10-CM | POA: Diagnosis not present

## 2019-05-24 DIAGNOSIS — M79651 Pain in right thigh: Secondary | ICD-10-CM | POA: Diagnosis not present

## 2019-05-24 DIAGNOSIS — S82151D Displaced fracture of right tibial tuberosity, subsequent encounter for closed fracture with routine healing: Secondary | ICD-10-CM | POA: Diagnosis not present

## 2019-05-27 MED FILL — GABAPENTIN 300 MG CAPSULE: 300 | 30 days supply | Qty: 90 | Fill #0

## 2019-05-27 MED FILL — METOPROLOL TARTRATE 25 MG T: 25 | 30 days supply | Qty: 30 | Fill #0

## 2019-05-27 MED FILL — METHOCARBAMOL 500 MG TABS: 500 | 18 days supply | Qty: 150 | Fill #0

## 2019-06-10 ENCOUNTER — Encounter: Payer: Self-pay | Admitting: Nurse Practitioner

## 2019-06-10 ENCOUNTER — Other Ambulatory Visit: Payer: Self-pay

## 2019-06-10 ENCOUNTER — Ambulatory Visit: Payer: Medicaid Other | Attending: Nurse Practitioner | Admitting: Nurse Practitioner

## 2019-06-10 VITALS — BP 121/84 | HR 54 | Temp 98.8°F | Ht 72.0 in | Wt 140.0 lb

## 2019-06-10 DIAGNOSIS — G8918 Other acute postprocedural pain: Secondary | ICD-10-CM | POA: Diagnosis not present

## 2019-06-10 DIAGNOSIS — S12601A Unspecified nondisplaced fracture of seventh cervical vertebra, initial encounter for closed fracture: Secondary | ICD-10-CM
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
## 2019-06-12 ENCOUNTER — Encounter: Payer: Self-pay | Admitting: Nurse Practitioner

## 2019-06-13 ENCOUNTER — Telehealth: Payer: Self-pay | Admitting: Nurse Practitioner

## 2019-06-13 NOTE — Telephone Encounter (Signed)
New Message ° ° °1) Medication(s) Requested (by name): Tramadol ° °2) Pharmacy of Choice: [facility] ° °3) Special Requests: ° ° °Approved medications will be sent to the pharmacy, we will reach out if there is an issue. ° °Requests made after 3pm may not be addressed until the following business day! ° °If a patient is unsure of the name of the medication(s) please note and ask patient to call back when they are able to provide all info, do not send to responsible party until all information is available! °

## 2019-06-20 NOTE — Telephone Encounter (Signed)
PCP denied.

## 2019-06-27 MED FILL — traMADol HCL 50 MG TABS: 50 | 5 days supply | Qty: 10 | Fill #0

## 2019-07-05 DIAGNOSIS — S82141D Displaced bicondylar fracture of right tibia, subsequent encounter for closed fracture with routine healing: Secondary | ICD-10-CM | POA: Diagnosis not present

## 2019-07-05 DIAGNOSIS — S82151D Displaced fracture of right tibial tuberosity, subsequent encounter for closed fracture with routine healing: Secondary | ICD-10-CM | POA: Diagnosis not present

## 2019-07-05 DIAGNOSIS — M79651 Pain in right thigh: Secondary | ICD-10-CM | POA: Diagnosis not present

## 2019-07-05 MED FILL — traMADol HCL 50 MG TABS: 50 | 25 days supply | Qty: 50 | Fill #0

## 2019-07-15 MED FILL — traMADol HCL 50 MG TABS: 50 | 25 days supply | Qty: 50 | Fill #0

## 2019-07-20 ENCOUNTER — Encounter: Payer: Medicaid Other | Admitting: Physical Medicine & Rehabilitation

## 2019-07-20 ENCOUNTER — Other Ambulatory Visit: Payer: Self-pay

## 2019-07-20 ENCOUNTER — Encounter (INDEPENDENT_AMBULATORY_CARE_PROVIDER_SITE_OTHER): Payer: Self-pay

## 2019-07-22 MED FILL — traMADol HCL 50 MG TABS: 50 | 25 days supply | Qty: 50 | Fill #0

## 2019-08-10 ENCOUNTER — Encounter: Payer: Self-pay | Admitting: Physical Medicine & Rehabilitation

## 2019-08-10 ENCOUNTER — Encounter: Payer: Medicaid Other | Attending: Registered Nurse | Admitting: Physical Medicine & Rehabilitation

## 2019-08-10 ENCOUNTER — Other Ambulatory Visit: Payer: Self-pay

## 2019-08-10 VITALS — BP 118/85 | HR 68 | Temp 97.7°F | Ht 73.0 in | Wt 140.0 lb

## 2019-08-10 DIAGNOSIS — S069X9S Unspecified intracranial injury with loss of consciousness of unspecified duration, sequela: Secondary | ICD-10-CM

## 2019-08-10 DIAGNOSIS — G4700 Insomnia, unspecified: Secondary | ICD-10-CM | POA: Insufficient documentation

## 2019-08-10 DIAGNOSIS — G8918 Other acute postprocedural pain: Secondary | ICD-10-CM | POA: Insufficient documentation

## 2019-08-10 DIAGNOSIS — F5104 Psychophysiologic insomnia: Secondary | ICD-10-CM

## 2019-08-10 DIAGNOSIS — S82141A Displaced bicondylar fracture of right tibia, initial encounter for closed fracture: Secondary | ICD-10-CM

## 2019-08-10 DIAGNOSIS — T1490XA Injury, unspecified, initial encounter: Secondary | ICD-10-CM | POA: Insufficient documentation

## 2019-08-10 MED ORDER — NORTRIPTYLINE HCL 10 MG PO CAPS: 10.0000 mg | ORAL_CAPSULE | Freq: Every day | ORAL | 2 refills | Status: DC

## 2019-08-10 NOTE — Patient Instructions (Signed)
YOU MAKE A SCHEDULE OF WHAT YOU'RE GOING TO EACH DAY. LOOK AT THE SCHEDULE EVERY DAY SO THAT IT BECOMES ROUTINE.
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
## 2019-09-14 ENCOUNTER — Other Ambulatory Visit: Payer: Self-pay | Admitting: Nurse Practitioner

## 2019-09-14 DIAGNOSIS — G8918 Other acute postprocedural pain: Secondary | ICD-10-CM

## 2019-09-14 DIAGNOSIS — R000 Tachycardia, unspecified: Secondary | ICD-10-CM

## 2019-09-14 NOTE — Telephone Encounter (Signed)
Pt called to request a refill for gabapentin (NEURONTIN) 300 MG capsule metoprolol tartrate (LOPRESSOR) 25 MG tablet  traMADol (ULTRAM) 50 MG tablet Please sent to [facility], [place]  [place], [place] [address]

## 2019-09-15 MED ORDER — METOPROLOL TARTRATE 25 MG PO TABS: 12.5000 mg | ORAL_TABLET | Freq: Two times a day (BID) | ORAL | 1 refills | Status: DC

## 2019-09-15 MED FILL — METOPROLOL TARTRATE 25 MG T: 25 | 30 days supply | Qty: 30 | Fill #0

## 2019-09-15 NOTE — Telephone Encounter (Signed)
Patient will need gabapentin and tramadol approved by PCP. I have refilled metoprolol.

## 2019-09-16 DIAGNOSIS — I609 Nontraumatic subarachnoid hemorrhage, unspecified: Secondary | ICD-10-CM | POA: Diagnosis not present

## 2019-09-17 MED ORDER — GABAPENTIN 300 MG PO CAPS: 300.0000 mg | ORAL_CAPSULE | Freq: Three times a day (TID) | ORAL | 0 refills | Status: DC

## 2019-09-19 MED FILL — GABAPENTIN 300 MG CAPSULE: 300 | 30 days supply | Qty: 90 | Fill #0

## 2019-09-28 MED FILL — METOPROLOL TARTRATE 25 MG T: 25 | 30 days supply | Qty: 30 | Fill #0

## 2019-09-28 MED FILL — traMADol HCL 50 MG TABS: 50 | 5 days supply | Qty: 10 | Fill #0

## 2019-09-28 MED FILL — GABAPENTIN 300 MG CAPSULE: 300 | 30 days supply | Qty: 90 | Fill #0

## 2019-10-05 ENCOUNTER — Encounter: Payer: Self-pay | Admitting: Physical Medicine & Rehabilitation

## 2019-10-05 ENCOUNTER — Encounter: Payer: Medicaid Other | Attending: Registered Nurse | Admitting: Physical Medicine & Rehabilitation

## 2019-10-05 ENCOUNTER — Other Ambulatory Visit: Payer: Self-pay

## 2019-10-05 VITALS — BP 148/117 | HR 98 | Temp 98.5°F | Ht 71.5 in | Wt 135.0 lb

## 2019-10-05 DIAGNOSIS — T1490XA Injury, unspecified, initial encounter: Secondary | ICD-10-CM | POA: Insufficient documentation

## 2019-10-05 DIAGNOSIS — G8918 Other acute postprocedural pain: Secondary | ICD-10-CM | POA: Insufficient documentation

## 2019-10-05 DIAGNOSIS — R4689 Other symptoms and signs involving appearance and behavior: Secondary | ICD-10-CM | POA: Diagnosis not present

## 2019-10-05 DIAGNOSIS — S069X9S Unspecified intracranial injury with loss of consciousness of unspecified duration, sequela: Secondary | ICD-10-CM | POA: Diagnosis not present

## 2019-10-05 DIAGNOSIS — S82141A Displaced bicondylar fracture of right tibia, initial encounter for closed fracture: Secondary | ICD-10-CM | POA: Diagnosis not present

## 2019-10-05 DIAGNOSIS — S069X0S Unspecified intracranial injury without loss of consciousness, sequela: Secondary | ICD-10-CM | POA: Diagnosis not present

## 2019-10-05 MED ORDER — CITALOPRAM HYDROBROMIDE 10 MG PO TABS: 10.0000 mg | ORAL_TABLET | Freq: Every day | ORAL | 2 refills | Status: DC

## 2019-10-05 MED ORDER — NORTRIPTYLINE HCL 25 MG PO CAPS: 25.0000 mg | ORAL_CAPSULE | Freq: Every day | ORAL | 2 refills | Status: DC

## 2019-10-05 MED FILL — NORTRIPTYLINE HCL 25 MG CAP: 25 | 30 days supply | Qty: 30 | Fill #0

## 2019-10-05 MED FILL — CITALOPRAM HBR 10 MG TABLET: 10 | 30 days supply | Qty: 30 | Fill #0
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
## 2019-10-11 DIAGNOSIS — S82151D Displaced fracture of right tibial tuberosity, subsequent encounter for closed fracture with routine healing: Secondary | ICD-10-CM | POA: Diagnosis not present

## 2019-10-11 DIAGNOSIS — M79651 Pain in right thigh: Secondary | ICD-10-CM | POA: Diagnosis not present

## 2019-10-17 DIAGNOSIS — I609 Nontraumatic subarachnoid hemorrhage, unspecified: Secondary | ICD-10-CM | POA: Diagnosis not present

## 2019-10-31 ENCOUNTER — Ambulatory Visit: Payer: No Typology Code available for payment source | Admitting: Physical Therapy

## 2019-11-04 ENCOUNTER — Encounter: Payer: Self-pay | Admitting: Physical Therapy

## 2019-11-04 ENCOUNTER — Ambulatory Visit: Payer: Medicaid Other | Attending: Nurse Practitioner | Admitting: Physical Therapy

## 2019-11-04 ENCOUNTER — Other Ambulatory Visit: Payer: Self-pay

## 2019-11-04 DIAGNOSIS — Z9889 Other specified postprocedural states: Secondary | ICD-10-CM | POA: Diagnosis not present

## 2019-11-04 DIAGNOSIS — M79604 Pain in right leg: Secondary | ICD-10-CM | POA: Diagnosis not present

## 2019-11-04 DIAGNOSIS — Z8781 Personal history of (healed) traumatic fracture: Secondary | ICD-10-CM | POA: Insufficient documentation
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
## 2019-11-14 DIAGNOSIS — I609 Nontraumatic subarachnoid hemorrhage, unspecified: Secondary | ICD-10-CM | POA: Diagnosis not present

## 2019-11-15 ENCOUNTER — Other Ambulatory Visit: Payer: Self-pay

## 2019-11-15 ENCOUNTER — Encounter: Payer: Self-pay | Admitting: Physical Therapy

## 2019-11-15 ENCOUNTER — Ambulatory Visit: Payer: Medicaid Other | Attending: Nurse Practitioner | Admitting: Physical Therapy

## 2019-11-15 DIAGNOSIS — Z9889 Other specified postprocedural states: Secondary | ICD-10-CM | POA: Insufficient documentation

## 2019-11-15 DIAGNOSIS — M79604 Pain in right leg: Secondary | ICD-10-CM | POA: Insufficient documentation

## 2019-11-15 DIAGNOSIS — S82141A Displaced bicondylar fracture of right tibia, initial encounter for closed fracture: Secondary | ICD-10-CM | POA: Insufficient documentation

## 2019-11-15 DIAGNOSIS — Z8781 Personal history of (healed) traumatic fracture: Secondary | ICD-10-CM | POA: Diagnosis not present
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
## 2019-11-17 ENCOUNTER — Encounter: Payer: Self-pay | Admitting: Physical Therapy

## 2019-11-17 ENCOUNTER — Ambulatory Visit: Payer: Medicaid Other | Admitting: Physical Therapy

## 2019-11-17 ENCOUNTER — Other Ambulatory Visit: Payer: Self-pay

## 2019-11-17 DIAGNOSIS — M79604 Pain in right leg: Secondary | ICD-10-CM

## 2019-11-17 DIAGNOSIS — Z8781 Personal history of (healed) traumatic fracture: Secondary | ICD-10-CM

## 2019-11-17 DIAGNOSIS — Z9889 Other specified postprocedural states: Secondary | ICD-10-CM

## 2019-11-17 DIAGNOSIS — S82141A Displaced bicondylar fracture of right tibia, initial encounter for closed fracture: Secondary | ICD-10-CM
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
## 2019-11-22 ENCOUNTER — Encounter: Payer: Self-pay | Admitting: Physical Therapy

## 2019-11-22 ENCOUNTER — Other Ambulatory Visit: Payer: Self-pay

## 2019-11-22 ENCOUNTER — Ambulatory Visit: Payer: Medicaid Other | Admitting: Physical Therapy

## 2019-11-22 DIAGNOSIS — Z8781 Personal history of (healed) traumatic fracture: Secondary | ICD-10-CM

## 2019-11-22 DIAGNOSIS — Z9889 Other specified postprocedural states: Secondary | ICD-10-CM | POA: Diagnosis not present

## 2019-11-22 DIAGNOSIS — M79604 Pain in right leg: Secondary | ICD-10-CM

## 2019-11-22 DIAGNOSIS — S82141A Displaced bicondylar fracture of right tibia, initial encounter for closed fracture: Secondary | ICD-10-CM | POA: Diagnosis not present
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
## 2019-11-30 ENCOUNTER — Encounter: Payer: Medicaid Other | Attending: Registered Nurse | Admitting: Physical Medicine & Rehabilitation

## 2019-11-30 ENCOUNTER — Other Ambulatory Visit: Payer: Self-pay

## 2019-11-30 ENCOUNTER — Encounter: Payer: Self-pay | Admitting: Physical Medicine & Rehabilitation

## 2019-11-30 VITALS — BP 151/89 | HR 66 | Temp 97.5°F | Ht 72.0 in | Wt 132.0 lb

## 2019-11-30 DIAGNOSIS — R4689 Other symptoms and signs involving appearance and behavior: Secondary | ICD-10-CM | POA: Insufficient documentation

## 2019-11-30 DIAGNOSIS — S069X9S Unspecified intracranial injury with loss of consciousness of unspecified duration, sequela: Secondary | ICD-10-CM

## 2019-11-30 DIAGNOSIS — S82141A Displaced bicondylar fracture of right tibia, initial encounter for closed fracture: Secondary | ICD-10-CM

## 2019-11-30 DIAGNOSIS — S069X0S Unspecified intracranial injury without loss of consciousness, sequela: Secondary | ICD-10-CM | POA: Insufficient documentation

## 2019-11-30 DIAGNOSIS — T1490XA Injury, unspecified, initial encounter: Secondary | ICD-10-CM | POA: Diagnosis not present

## 2019-11-30 DIAGNOSIS — G8918 Other acute postprocedural pain: Secondary | ICD-10-CM | POA: Insufficient documentation

## 2019-11-30 MED ORDER — TRAMADOL HCL 50 MG PO TABS: 50.0000 mg | ORAL_TABLET | Freq: Four times a day (QID) | ORAL | 1 refills | Status: DC | PRN

## 2019-11-30 MED ORDER — CITALOPRAM HYDROBROMIDE 20 MG PO TABS: 20.0000 mg | ORAL_TABLET | Freq: Every day | ORAL | 3 refills | Status: DC

## 2019-11-30 MED FILL — CITALOPRAM HBR 20 MG TABLET: 20 | 30 days supply | Qty: 30 | Fill #0

## 2019-11-30 MED FILL — traMADol HCL 50 MG TABS: 50 | 7 days supply | Qty: 28 | Fill #0

## 2019-11-30 NOTE — Patient Instructions (Signed)
USE YOUR PHONE/CALENDAR TO KEEP A ROUTINE AND SCHEDULE

## 2019-11-30 NOTE — Progress Notes (Signed)
Subjective:    Patient ID: Gary Ashley, male    DOB: 01/26/1981, 39 y.o.   MRN: [id number]  HPI   Gary Ashley is here in follow up of his TBI. He is working with with outpt therapies. He is moving better but is still having headaches, memory difficulties, occasional dizziness. He reports decreased appetite. He is sleeping better. Headaches are present but improved.   He tells me that orthopedics sent him for therapy for his right knee.  He still having a good bit of pain with the knee.  He also believes he was not given a follow-up appointment.  He uses tramadol for breakthrough pain which is provided some relief.  He does use some ice and other topical remedies.  He does feel that his mood sometimes is labile.  He has had anxiety and can become agitated around his kids.  His his kids are only 61 and 19 years old.  He does have some insight and walks away at times to help work through these moments.  He is sleeping a lot better with nortriptyline and Celexa.  Depression is somewhat improved..     Pain Inventory Average Pain 7 Pain Right Now 7 My pain is sharp, stabbing and aching  In the last 24 hours, has pain interfered with the following? General activity 9 Relation with others 9 Enjoyment of life 9 What TIME of day is your pain at its worst? daytime,night Sleep (in general) Fair  Pain is worse with: standing and some activites Pain improves with: none Relief from Meds: 0  Mobility walk without assistance walk with assistance use a cane use a walker ability to climb steps?  yes do you drive?  no  Function Do you have any goals in this area?  no  Neuro/Psych weakness tingling dizziness confusion depression anxiety suicidal thoughts-no active plans   Prior Studies Any changes since last visit?  no  Physicians involved in your care Any changes since last visit?  no   Family History  Problem Relation Age of Onset  . Healthy Mother   . Healthy Father     Social History   Socioeconomic History  . Marital status: Single    Spouse name: Not on file  . Number of children: Not on file  . Years of education: Not on file  . Highest education level: Not on file  Occupational History  . Not on file  Tobacco Use  . Smoking status: Current Some Day Smoker    Types: Cigarettes  . Smokeless tobacco: Never Used  Substance and Sexual Activity  . Alcohol use: Not Currently  . Drug use: Not Currently    Types: Marijuana  . Sexual activity: Yes  Other Topics Concern  . Not on file  Social History Narrative  . Not on file   Social Determinants of Health   Financial Resource Strain:   . Difficulty of Paying Living Expenses:   Food Insecurity:   . Worried About Charity fundraiser in the Last Year:   . Arboriculturist in the Last Year:   Transportation Needs:   . Film/video editor (Medical):   [name] Lack of Transportation (Non-Medical):   Physical Activity:   . Days of Exercise per Week:   . Minutes of Exercise per Session:   Stress:   . Feeling of Stress :   Social Connections:   . Frequency of Communication with Friends and Family:   . Frequency of Social Gatherings with  Friends and Family:   . Attends Religious Services:   . Active Member of Clubs or Organizations:   . Attends Banker Meetings:   [name] Marital Status:    Past Surgical History:  Procedure Laterality Date  . APPENDECTOMY    . EXTERNAL FIXATION LEG Bilateral 02/18/2019   Procedure: EXTERNAL FIXATION RIGHT LEG, I&D LEFT LEG;  Surgeon: Roby Lofts, MD;  [facility]: [facility];  Service: Orthopedics;  Laterality: Bilateral;  . ORIF TIBIA PLATEAU Right 02/21/2019   Procedure: OPEN REDUCTION INTERNAL FIXATION (ORIF) TIBIAL PLATEAU;  Surgeon: Roby Lofts, MD;  Location: [facility] OR;  Service: Orthopedics;  Laterality: Right;   Past Medical History:  Diagnosis Date  . Bilateral pneumothorax   . C7 cervical fracture (HCC)   . SAH (subarachnoid hemorrhage) (HCC)    . Sinus tachycardia   . TBI (traumatic brain injury) (HCC)   . Vitamin D deficiency    BP (!) 151/89   Pulse 66   Temp (!) 97.5 F (36.4 C)   Ht 6' (1.829 m)   Wt 132 lb (59.9 kg)   SpO2 95%   BMI 17.90 kg/m   Opioid Risk Score:   Fall Risk Score:  `1  Depression screen PHQ 2/9  Depression screen [facility] 2/9 10/05/2019 06/10/2019 03/16/2019 03/15/2019  Decreased Interest 3 2 3 3   Down, Depressed, Hopeless 3 2 3 3   PHQ - 2 Score 6 4 6 6   Altered sleeping 2 3 3 3   Tired, decreased energy 2 3 0 3  Change in appetite 0 0 0 0  Feeling bad or failure about yourself  2 0 3 3  Trouble concentrating 2 0 3 3  Moving slowly or fidgety/restless 2 2 0 3  Suicidal thoughts 1 2 0 0  PHQ-9 Score 17 14 15 21   Difficult doing work/chores Very difficult - - -    Review of Systems  Constitutional: Positive for appetite change and diaphoresis.  HENT: Negative.   Eyes: Negative.   Respiratory: Positive for shortness of breath.   Cardiovascular: Negative.   Gastrointestinal: Negative.   Endocrine: Negative.   Genitourinary: Negative.   Musculoskeletal: Negative.   Skin: Negative.   Allergic/Immunologic: Negative.   Neurological: Positive for weakness.       Tingling  Hematological: Negative.   Psychiatric/Behavioral: Positive for confusion, dysphoric mood and suicidal ideas. The patient is nervous/anxious.   All other systems reviewed and are negative.      Objective:   Physical Exam  General: No acute distress HEENT: EOMI, oral membranes moist Cards: reg rate  Chest: normal effort Abdomen: Soft, NT, ND Skin: dry, intact Extremities: no edema Skin: intact Neuro:fair insight and awareness. Musculoskeletal:right knee still tender but with Crepitus with AROM.  full rom. Antalgia RLE Psych: still anxious, a little disinhibited. Pleasant and cooperative     Assessment & Plan:  1.Multiple trauma with TBI -Continues to progress with TBI -reiterated  and discussed use of a calendar/schedule. Can use phone to do that. Can dictate in 2. Closed Bicondylar Fracture of Right Tibial Plateau: Orthopedic Following. Still having a lot of pain  -Continue with physical therapy.  He needs to contact orthopedics regarding a follow-up appointment also. -ongoing tramadol for ongoing knee pain per ortho-RF'ed today   -We will continue the controlled substance monitoring program, this consists of regular clinic visits, examinations, routine drug screening, pill counts as well as use of Controlled Substance Reporting System. [place].   -gabapentin  for neuropathic pain to continue 3. CervicalFracture( C7):cleared from a NS standpoing 4.Insomnia, nightmares/post-TBI behavioral changes         -continue nortriptyline at 25mg  qhs        -increase celexa to 20mg  qhs for depression        -Pending visit in April to neuropsych for help with coping and perhaps memory strategies.    Fifteen minutes of face to face patient care time were spent during this visit. All questions were encouraged and answered.  Follow up with me in 3 mos .

## 2019-12-06 ENCOUNTER — Other Ambulatory Visit: Payer: Self-pay

## 2019-12-06 ENCOUNTER — Encounter: Payer: Self-pay | Admitting: Physical Therapy

## 2019-12-06 ENCOUNTER — Ambulatory Visit: Payer: Medicaid Other | Admitting: Physical Therapy

## 2019-12-06 DIAGNOSIS — Z9889 Other specified postprocedural states: Secondary | ICD-10-CM

## 2019-12-06 DIAGNOSIS — S82141A Displaced bicondylar fracture of right tibia, initial encounter for closed fracture: Secondary | ICD-10-CM | POA: Diagnosis not present

## 2019-12-06 DIAGNOSIS — Z8781 Personal history of (healed) traumatic fracture: Secondary | ICD-10-CM

## 2019-12-06 DIAGNOSIS — M79604 Pain in right leg: Secondary | ICD-10-CM
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
## 2019-12-09 ENCOUNTER — Encounter: Payer: Self-pay | Admitting: Physical Therapy

## 2019-12-09 ENCOUNTER — Ambulatory Visit: Payer: Medicaid Other | Admitting: Physical Therapy

## 2019-12-09 ENCOUNTER — Other Ambulatory Visit: Payer: Self-pay

## 2019-12-09 DIAGNOSIS — S82141A Displaced bicondylar fracture of right tibia, initial encounter for closed fracture: Secondary | ICD-10-CM

## 2019-12-09 DIAGNOSIS — M79604 Pain in right leg: Secondary | ICD-10-CM

## 2019-12-09 DIAGNOSIS — Z9889 Other specified postprocedural states: Secondary | ICD-10-CM | POA: Diagnosis not present

## 2019-12-09 DIAGNOSIS — Z8781 Personal history of (healed) traumatic fracture: Secondary | ICD-10-CM | POA: Diagnosis not present
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
## 2019-12-12 ENCOUNTER — Other Ambulatory Visit: Payer: Self-pay

## 2019-12-12 ENCOUNTER — Encounter: Payer: Self-pay | Admitting: Physical Therapy

## 2019-12-12 ENCOUNTER — Ambulatory Visit: Payer: Medicaid Other | Admitting: Physical Therapy

## 2019-12-12 DIAGNOSIS — Z8781 Personal history of (healed) traumatic fracture: Secondary | ICD-10-CM | POA: Diagnosis not present

## 2019-12-12 DIAGNOSIS — M79604 Pain in right leg: Secondary | ICD-10-CM | POA: Diagnosis not present

## 2019-12-12 DIAGNOSIS — S82141A Displaced bicondylar fracture of right tibia, initial encounter for closed fracture: Secondary | ICD-10-CM

## 2019-12-12 DIAGNOSIS — Z9889 Other specified postprocedural states: Secondary | ICD-10-CM | POA: Diagnosis not present

## 2019-12-13 DIAGNOSIS — S82151D Displaced fracture of right tibial tuberosity, subsequent encounter for closed fracture with routine healing: Secondary | ICD-10-CM | POA: Diagnosis not present
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
## 2019-12-15 ENCOUNTER — Encounter: Payer: Self-pay | Admitting: Physical Therapy

## 2019-12-15 ENCOUNTER — Ambulatory Visit: Payer: Medicaid Other | Attending: Nurse Practitioner | Admitting: Physical Therapy

## 2019-12-15 ENCOUNTER — Other Ambulatory Visit: Payer: Self-pay

## 2019-12-15 DIAGNOSIS — Z8781 Personal history of (healed) traumatic fracture: Secondary | ICD-10-CM | POA: Diagnosis not present

## 2019-12-15 DIAGNOSIS — M79604 Pain in right leg: Secondary | ICD-10-CM | POA: Diagnosis not present

## 2019-12-15 DIAGNOSIS — S82141A Displaced bicondylar fracture of right tibia, initial encounter for closed fracture: Secondary | ICD-10-CM | POA: Insufficient documentation

## 2019-12-15 DIAGNOSIS — Z9889 Other specified postprocedural states: Secondary | ICD-10-CM | POA: Insufficient documentation
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
## 2019-12-20 ENCOUNTER — Encounter: Payer: Medicaid Other | Attending: Registered Nurse | Admitting: Psychology

## 2019-12-20 ENCOUNTER — Encounter: Payer: Self-pay | Admitting: Psychology

## 2019-12-20 ENCOUNTER — Other Ambulatory Visit: Payer: Self-pay

## 2019-12-20 DIAGNOSIS — F5104 Psychophysiologic insomnia: Secondary | ICD-10-CM | POA: Diagnosis not present

## 2019-12-20 DIAGNOSIS — G8918 Other acute postprocedural pain: Secondary | ICD-10-CM | POA: Diagnosis not present

## 2019-12-20 DIAGNOSIS — R4689 Other symptoms and signs involving appearance and behavior: Secondary | ICD-10-CM | POA: Diagnosis not present

## 2019-12-20 DIAGNOSIS — S069X9S Unspecified intracranial injury with loss of consciousness of unspecified duration, sequela: Secondary | ICD-10-CM | POA: Insufficient documentation

## 2019-12-20 DIAGNOSIS — S82141A Displaced bicondylar fracture of right tibia, initial encounter for closed fracture: Secondary | ICD-10-CM

## 2019-12-20 DIAGNOSIS — T1490XA Injury, unspecified, initial encounter: Secondary | ICD-10-CM | POA: Diagnosis not present

## 2019-12-20 DIAGNOSIS — S069X0S Unspecified intracranial injury without loss of consciousness, sequela: Secondary | ICD-10-CM | POA: Insufficient documentation
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
## 2019-12-23 ENCOUNTER — Other Ambulatory Visit: Payer: Self-pay

## 2019-12-23 ENCOUNTER — Encounter: Payer: Self-pay | Admitting: Physical Therapy

## 2019-12-23 ENCOUNTER — Ambulatory Visit: Payer: Medicaid Other | Admitting: Physical Therapy

## 2019-12-23 DIAGNOSIS — M79604 Pain in right leg: Secondary | ICD-10-CM

## 2019-12-23 DIAGNOSIS — Z8781 Personal history of (healed) traumatic fracture: Secondary | ICD-10-CM

## 2019-12-23 DIAGNOSIS — S82141A Displaced bicondylar fracture of right tibia, initial encounter for closed fracture: Secondary | ICD-10-CM

## 2019-12-23 DIAGNOSIS — Z9889 Other specified postprocedural states: Secondary | ICD-10-CM | POA: Diagnosis not present
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
## 2019-12-27 ENCOUNTER — Ambulatory Visit: Payer: Medicaid Other | Admitting: Physical Therapy

## 2019-12-30 ENCOUNTER — Encounter: Payer: Self-pay | Admitting: Physical Therapy

## 2019-12-30 ENCOUNTER — Other Ambulatory Visit: Payer: Self-pay

## 2019-12-30 ENCOUNTER — Ambulatory Visit: Payer: Medicaid Other | Admitting: Physical Therapy

## 2019-12-30 DIAGNOSIS — S82141A Displaced bicondylar fracture of right tibia, initial encounter for closed fracture: Secondary | ICD-10-CM

## 2019-12-30 DIAGNOSIS — Z8781 Personal history of (healed) traumatic fracture: Secondary | ICD-10-CM

## 2019-12-30 DIAGNOSIS — Z9889 Other specified postprocedural states: Secondary | ICD-10-CM

## 2019-12-30 DIAGNOSIS — M79604 Pain in right leg: Secondary | ICD-10-CM
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
## 2020-01-03 ENCOUNTER — Ambulatory Visit: Payer: Medicaid Other | Admitting: Physical Therapy

## 2020-01-03 ENCOUNTER — Other Ambulatory Visit: Payer: Self-pay

## 2020-01-03 ENCOUNTER — Encounter: Payer: Self-pay | Admitting: Physical Therapy

## 2020-01-03 DIAGNOSIS — Z8781 Personal history of (healed) traumatic fracture: Secondary | ICD-10-CM

## 2020-01-03 DIAGNOSIS — M79604 Pain in right leg: Secondary | ICD-10-CM | POA: Diagnosis not present

## 2020-01-03 DIAGNOSIS — Z9889 Other specified postprocedural states: Secondary | ICD-10-CM

## 2020-01-03 DIAGNOSIS — S82141A Displaced bicondylar fracture of right tibia, initial encounter for closed fracture: Secondary | ICD-10-CM
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
## 2020-01-06 ENCOUNTER — Other Ambulatory Visit: Payer: Self-pay

## 2020-01-06 ENCOUNTER — Ambulatory Visit: Payer: Medicaid Other | Admitting: Physical Therapy

## 2020-01-06 ENCOUNTER — Encounter: Payer: Self-pay | Admitting: Physical Therapy

## 2020-01-06 DIAGNOSIS — S82141A Displaced bicondylar fracture of right tibia, initial encounter for closed fracture: Secondary | ICD-10-CM | POA: Diagnosis not present

## 2020-01-06 DIAGNOSIS — Z9889 Other specified postprocedural states: Secondary | ICD-10-CM

## 2020-01-06 DIAGNOSIS — M79604 Pain in right leg: Secondary | ICD-10-CM | POA: Diagnosis not present

## 2020-01-06 DIAGNOSIS — Z8781 Personal history of (healed) traumatic fracture: Secondary | ICD-10-CM | POA: Diagnosis not present
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
## 2020-01-10 ENCOUNTER — Ambulatory Visit: Payer: Medicaid Other | Admitting: Physical Therapy

## 2020-01-10 ENCOUNTER — Other Ambulatory Visit: Payer: Self-pay

## 2020-01-10 ENCOUNTER — Encounter: Payer: Self-pay | Admitting: Physical Therapy

## 2020-01-10 DIAGNOSIS — M79604 Pain in right leg: Secondary | ICD-10-CM

## 2020-01-10 DIAGNOSIS — S82141A Displaced bicondylar fracture of right tibia, initial encounter for closed fracture: Secondary | ICD-10-CM | POA: Diagnosis not present

## 2020-01-10 DIAGNOSIS — Z8781 Personal history of (healed) traumatic fracture: Secondary | ICD-10-CM | POA: Diagnosis not present

## 2020-01-10 DIAGNOSIS — Z9889 Other specified postprocedural states: Secondary | ICD-10-CM | POA: Diagnosis not present
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
## 2020-01-13 ENCOUNTER — Ambulatory Visit: Payer: Medicaid Other | Admitting: Physical Therapy

## 2020-01-17 ENCOUNTER — Encounter: Payer: Medicaid Other | Admitting: Physical Therapy

## 2020-02-15 ENCOUNTER — Ambulatory Visit: Payer: Medicaid Other | Admitting: Psychology

## 2020-02-29 ENCOUNTER — Other Ambulatory Visit: Payer: Self-pay

## 2020-02-29 ENCOUNTER — Encounter: Payer: Self-pay | Admitting: Physical Medicine & Rehabilitation

## 2020-02-29 ENCOUNTER — Encounter: Payer: Medicaid Other | Attending: Registered Nurse | Admitting: Physical Medicine & Rehabilitation

## 2020-02-29 VITALS — BP 144/93 | HR 64 | Temp 97.7°F | Ht 72.0 in | Wt 134.4 lb

## 2020-02-29 DIAGNOSIS — Z7689 Persons encountering health services in other specified circumstances: Secondary | ICD-10-CM | POA: Diagnosis not present

## 2020-02-29 DIAGNOSIS — R4689 Other symptoms and signs involving appearance and behavior: Secondary | ICD-10-CM | POA: Diagnosis not present

## 2020-02-29 DIAGNOSIS — M25562 Pain in left knee: Secondary | ICD-10-CM

## 2020-02-29 DIAGNOSIS — Z79899 Other long term (current) drug therapy: Secondary | ICD-10-CM | POA: Diagnosis not present

## 2020-02-29 DIAGNOSIS — S82142S Displaced bicondylar fracture of left tibia, sequela: Secondary | ICD-10-CM | POA: Diagnosis not present

## 2020-02-29 DIAGNOSIS — G8918 Other acute postprocedural pain: Secondary | ICD-10-CM | POA: Diagnosis not present

## 2020-02-29 DIAGNOSIS — T1490XA Injury, unspecified, initial encounter: Secondary | ICD-10-CM | POA: Insufficient documentation

## 2020-02-29 DIAGNOSIS — S069X9S Unspecified intracranial injury with loss of consciousness of unspecified duration, sequela: Secondary | ICD-10-CM

## 2020-02-29 DIAGNOSIS — S82141A Displaced bicondylar fracture of right tibia, initial encounter for closed fracture: Secondary | ICD-10-CM | POA: Diagnosis not present

## 2020-02-29 DIAGNOSIS — S069X0S Unspecified intracranial injury without loss of consciousness, sequela: Secondary | ICD-10-CM | POA: Diagnosis not present

## 2020-02-29 DIAGNOSIS — G8929 Other chronic pain: Secondary | ICD-10-CM | POA: Diagnosis not present

## 2020-02-29 DIAGNOSIS — S82151S Displaced fracture of right tibial tuberosity, sequela: Secondary | ICD-10-CM | POA: Diagnosis not present

## 2020-02-29 DIAGNOSIS — Z5181 Encounter for therapeutic drug level monitoring: Secondary | ICD-10-CM

## 2020-02-29 DIAGNOSIS — G894 Chronic pain syndrome: Secondary | ICD-10-CM | POA: Diagnosis not present

## 2020-02-29 MED ORDER — DICLOFENAC SODIUM 75 MG PO TBEC: 75.0000 mg | DELAYED_RELEASE_TABLET | Freq: Two times a day (BID) | ORAL | 3 refills | Status: DC

## 2020-02-29 NOTE — Patient Instructions (Addendum)
PLEASE FEEL FREE TO CALL OUR OFFICE WITH ANY PROBLEMS OR QUESTIONS ([phone])      

## 2020-02-29 NOTE — Addendum Note (Signed)
Addended by: Doreene Eland on: 02/29/2020 10:32 AM   Modules accepted: Orders
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
## 2020-03-04 ENCOUNTER — Other Ambulatory Visit: Payer: Self-pay

## 2020-03-04 DIAGNOSIS — M542 Cervicalgia: Secondary | ICD-10-CM | POA: Insufficient documentation

## 2020-03-04 DIAGNOSIS — Z5321 Procedure and treatment not carried out due to patient leaving prior to being seen by health care provider: Secondary | ICD-10-CM | POA: Insufficient documentation

## 2020-03-05 ENCOUNTER — Other Ambulatory Visit: Payer: Self-pay

## 2020-03-05 ENCOUNTER — Emergency Department (HOSPITAL_COMMUNITY)
Admission: EM | Admit: 2020-03-05 | Discharge: 2020-03-05 | Disposition: A | Discharge status: Home / Self Care | Payer: Medicaid Other | Attending: Emergency Medicine | Admitting: Emergency Medicine

## 2020-03-05 ENCOUNTER — Encounter (HOSPITAL_COMMUNITY): Payer: Self-pay | Admitting: Emergency Medicine

## 2020-03-05 NOTE — ED Notes (Signed)
Pt left. Pt stated his ride was here and he couldn't stay.
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
## 2020-03-06 ENCOUNTER — Telehealth: Payer: Self-pay | Admitting: *Deleted

## 2020-03-06 NOTE — Telephone Encounter (Signed)
Prior authorization submitted for diclofenac sodium tablets via [employer]  Approved  03/03/2020-02/26/2021

## 2020-03-07 MED FILL — DICLOFENAC SOD EC 75 MG TAB: 75 | 30 days supply | Qty: 60 | Fill #0

## 2020-03-12 ENCOUNTER — Other Ambulatory Visit: Payer: Self-pay

## 2020-03-12 ENCOUNTER — Ambulatory Visit
Admission: RE | Admit: 2020-03-12 | Discharge: 2020-03-12 | Disposition: A | Discharge status: Home / Self Care | Payer: Medicaid Other | Source: Ambulatory Visit | Attending: Physical Medicine & Rehabilitation | Admitting: Physical Medicine & Rehabilitation

## 2020-03-12 DIAGNOSIS — G8929 Other chronic pain: Secondary | ICD-10-CM

## 2020-03-12 DIAGNOSIS — M25562 Pain in left knee: Secondary | ICD-10-CM | POA: Diagnosis not present

## 2020-03-12 DIAGNOSIS — S82142S Displaced bicondylar fracture of left tibia, sequela: Secondary | ICD-10-CM

## 2020-03-12 DIAGNOSIS — Z7689 Persons encountering health services in other specified circumstances: Secondary | ICD-10-CM | POA: Diagnosis not present

## 2020-03-20 DIAGNOSIS — S82151D Displaced fracture of right tibial tuberosity, subsequent encounter for closed fracture with routine healing: Secondary | ICD-10-CM | POA: Diagnosis not present

## 2020-03-20 DIAGNOSIS — M79651 Pain in right thigh: Secondary | ICD-10-CM | POA: Diagnosis not present

## 2020-03-22 ENCOUNTER — Other Ambulatory Visit: Payer: Self-pay

## 2020-03-22 ENCOUNTER — Encounter: Payer: Self-pay | Admitting: Psychology

## 2020-03-22 ENCOUNTER — Encounter: Payer: Medicaid Other | Attending: Registered Nurse | Admitting: Psychology

## 2020-03-22 DIAGNOSIS — S069X0S Unspecified intracranial injury without loss of consciousness, sequela: Secondary | ICD-10-CM | POA: Insufficient documentation

## 2020-03-22 DIAGNOSIS — S82142S Displaced bicondylar fracture of left tibia, sequela: Secondary | ICD-10-CM | POA: Insufficient documentation

## 2020-03-22 DIAGNOSIS — G894 Chronic pain syndrome: Secondary | ICD-10-CM

## 2020-03-22 DIAGNOSIS — G8929 Other chronic pain: Secondary | ICD-10-CM | POA: Diagnosis not present

## 2020-03-22 DIAGNOSIS — R4689 Other symptoms and signs involving appearance and behavior: Secondary | ICD-10-CM | POA: Diagnosis not present

## 2020-03-22 DIAGNOSIS — M25562 Pain in left knee: Secondary | ICD-10-CM

## 2020-03-22 DIAGNOSIS — S069X9S Unspecified intracranial injury with loss of consciousness of unspecified duration, sequela: Secondary | ICD-10-CM | POA: Insufficient documentation

## 2020-03-22 DIAGNOSIS — T1490XA Injury, unspecified, initial encounter: Secondary | ICD-10-CM | POA: Insufficient documentation

## 2020-03-22 DIAGNOSIS — R440 Auditory hallucinations: Secondary | ICD-10-CM | POA: Diagnosis not present

## 2020-03-22 DIAGNOSIS — G8918 Other acute postprocedural pain: Secondary | ICD-10-CM | POA: Diagnosis present

## 2020-03-22 DIAGNOSIS — S82141A Displaced bicondylar fracture of right tibia, initial encounter for closed fracture: Secondary | ICD-10-CM | POA: Diagnosis present
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
## 2020-04-05 ENCOUNTER — Ambulatory Visit: Payer: Medicaid Other | Admitting: Psychology

## 2020-04-19 ENCOUNTER — Encounter: Payer: Medicaid Other | Attending: Registered Nurse | Admitting: Psychology

## 2020-04-19 ENCOUNTER — Other Ambulatory Visit: Payer: Self-pay

## 2020-04-19 ENCOUNTER — Ambulatory Visit: Payer: Medicaid Other | Admitting: Psychology

## 2020-04-19 DIAGNOSIS — G8918 Other acute postprocedural pain: Secondary | ICD-10-CM | POA: Insufficient documentation

## 2020-04-19 DIAGNOSIS — G894 Chronic pain syndrome: Secondary | ICD-10-CM | POA: Diagnosis not present

## 2020-04-19 DIAGNOSIS — S82141A Displaced bicondylar fracture of right tibia, initial encounter for closed fracture: Secondary | ICD-10-CM | POA: Diagnosis present

## 2020-04-19 DIAGNOSIS — S069X9S Unspecified intracranial injury with loss of consciousness of unspecified duration, sequela: Secondary | ICD-10-CM | POA: Diagnosis not present

## 2020-04-19 DIAGNOSIS — T1490XA Injury, unspecified, initial encounter: Secondary | ICD-10-CM | POA: Insufficient documentation

## 2020-04-19 DIAGNOSIS — R440 Auditory hallucinations: Secondary | ICD-10-CM

## 2020-04-19 DIAGNOSIS — S82142S Displaced bicondylar fracture of left tibia, sequela: Secondary | ICD-10-CM | POA: Insufficient documentation

## 2020-04-19 DIAGNOSIS — M25562 Pain in left knee: Secondary | ICD-10-CM | POA: Diagnosis present

## 2020-04-19 DIAGNOSIS — R4689 Other symptoms and signs involving appearance and behavior: Secondary | ICD-10-CM

## 2020-04-19 DIAGNOSIS — G8929 Other chronic pain: Secondary | ICD-10-CM | POA: Diagnosis present

## 2020-04-19 DIAGNOSIS — S069X0S Unspecified intracranial injury without loss of consciousness, sequela: Secondary | ICD-10-CM | POA: Diagnosis present

## 2020-04-20 ENCOUNTER — Other Ambulatory Visit (HOSPITAL_COMMUNITY): Payer: Self-pay | Admitting: Physical Medicine & Rehabilitation

## 2020-04-20 MED ORDER — QUETIAPINE FUMARATE 50 MG PO TABS
50.0000 mg | ORAL_TABLET | Freq: Every day | ORAL | 2 refills | Status: DC
Start: 2020-04-20 — End: 2020-10-24

## 2020-04-20 MED FILL — QUETIAPINE FUMARATE 50 MG T: 50 | 30 days supply | Qty: 30 | Fill #0

## 2020-05-02 ENCOUNTER — Encounter: Payer: Medicaid Other | Admitting: Physical Medicine & Rehabilitation

## 2020-05-02 ENCOUNTER — Other Ambulatory Visit: Payer: Self-pay

## 2020-05-02 ENCOUNTER — Encounter: Payer: Self-pay | Admitting: Physical Medicine & Rehabilitation

## 2020-05-02 VITALS — BP 138/95 | HR 63 | Temp 98.4°F | Ht 71.0 in | Wt 133.8 lb

## 2020-05-02 DIAGNOSIS — F5102 Adjustment insomnia: Secondary | ICD-10-CM | POA: Diagnosis not present

## 2020-05-02 DIAGNOSIS — M25562 Pain in left knee: Secondary | ICD-10-CM | POA: Diagnosis not present

## 2020-05-02 DIAGNOSIS — S069X9S Unspecified intracranial injury with loss of consciousness of unspecified duration, sequela: Secondary | ICD-10-CM | POA: Diagnosis not present

## 2020-05-02 NOTE — Patient Instructions (Signed)
PLEASE FEEL FREE TO CALL OUR OFFICE WITH ANY PROBLEMS OR QUESTIONS ([phone])      

## 2020-05-02 NOTE — Progress Notes (Signed)
Subjective:    Patient ID: Gary Ashley, male    DOB: 06-04-1981, 39 y.o.   MRN: [id number]  HPI This is here in follow-up of his traumatic brain injury and associated polytrauma.  I spoke with Dr. Kieth Brightly last month and we discussed the potential need for further medication to help with sleep and hallucinations that he is experiencing at nighttime.  Pain Inventory Average Pain 7 Pain Right Now 7 My pain is constant, sharp, stabbing and tingling  In the last 24 hours, has pain interfered with the following? General activity 7 Relation with others 8 Enjoyment of life 8 What TIME of day is your pain at its worst? evening and night Sleep (in general) Poor  Pain is worse with: walking, standing and some activites Pain improves with: rest, heat/ice and medication Relief from Meds: 5  Family History  Problem Relation Age of Onset  . Healthy Mother   . Healthy Father    Social History   Socioeconomic History  . Marital status: Single    Spouse name: Not on file  . Number of children: Not on file  . Years of education: Not on file  . Highest education level: Not on file  Occupational History  . Not on file  Tobacco Use  . Smoking status: Current Some Day Smoker    Types: Cigarettes  . Smokeless tobacco: Never Used  Vaping Use  . Vaping Use: Never used  Substance and Sexual Activity  . Alcohol use: Not Currently  . Drug use: Yes    Types: Marijuana    Comment: sometimes  . Sexual activity: Yes  Other Topics Concern  . Not on file  Social History Narrative  . Not on file   Social Determinants of Health   Financial Resource Strain:   . Difficulty of Paying Living Expenses:   Food Insecurity:   . Worried About Programme researcher, broadcasting/film/video in the Last Year:   . Barista in the Last Year:   Transportation Needs:   . Freight forwarder (Medical):   [name] Lack of Transportation (Non-Medical):   Physical Activity:   . Days of Exercise per Week:   . Minutes  of Exercise per Session:   Stress:   . Feeling of Stress :   Social Connections:   . Frequency of Communication with Friends and Family:   . Frequency of Social Gatherings with Friends and Family:   . Attends Religious Services:   . Active Member of Clubs or Organizations:   . Attends Banker Meetings:   [name] Marital Status:    Past Surgical History:  Procedure Laterality Date  . APPENDECTOMY    . EXTERNAL FIXATION LEG Bilateral 02/18/2019   Procedure: EXTERNAL FIXATION RIGHT LEG, I&D LEFT LEG;  Surgeon: Roby Lofts, MD;  Location: [facility] OR;  Service: Orthopedics;  Laterality: Bilateral;  . ORIF TIBIA PLATEAU Right 02/21/2019   Procedure: OPEN REDUCTION INTERNAL FIXATION (ORIF) TIBIAL PLATEAU;  Surgeon: Roby Lofts, MD;  Location: [facility] OR;  Service: Orthopedics;  Laterality: Right;   Past Surgical History:  Procedure Laterality Date  . APPENDECTOMY    . EXTERNAL FIXATION LEG Bilateral 02/18/2019   Procedure: EXTERNAL FIXATION RIGHT LEG, I&D LEFT LEG;  Surgeon: Roby Lofts, MD;  Location: [facility] OR;  Service: Orthopedics;  Laterality: Bilateral;  . ORIF TIBIA PLATEAU Right 02/21/2019   Procedure: OPEN REDUCTION INTERNAL FIXATION (ORIF) TIBIAL PLATEAU;  Surgeon: Roby Lofts, MD;  Location: [facility]  OR;  Service: Orthopedics;  Laterality: Right;   Past Medical History:  Diagnosis Date  . Bilateral pneumothorax   . C7 cervical fracture (HCC)   . SAH (subarachnoid hemorrhage) (HCC)   . Sinus tachycardia   . TBI (traumatic brain injury) (HCC)   . Vitamin D deficiency    BP (!) 138/95   Pulse 63   Temp 98.4 F (36.9 C)   Ht 5\' 11"  (1.803 m)   Wt 133 lb 12.8 oz (60.7 kg)   SpO2 95%   BMI 18.66 kg/m   Opioid Risk Score:   Fall Risk Score:  `1  Depression screen PHQ 2/9  Depression screen [facility] 2/9 02/29/2020 10/05/2019 06/10/2019 03/16/2019 03/15/2019  Decreased Interest 3 3 2 3 3   Down, Depressed, Hopeless 3 3 2 3 3   PHQ - 2 Score 6 6 4 6 6   Altered sleeping 3 2 3 3 3     Tired, decreased energy 3 2 3  0 3  Change in appetite 0 0 0 0 0  Feeling bad or failure about yourself  1 2 0 3 3  Trouble concentrating 3 2 0 3 3  Moving slowly or fidgety/restless 3 2 2  0 3  Suicidal thoughts 3 1 2  0 0  PHQ-9 Score 22 17 14 15 21   Difficult doing work/chores - Very difficult - - -   Review of Systems  Constitutional: Negative.   HENT: Negative.   Eyes: Negative.   Respiratory: Negative.   Cardiovascular: Negative.   Gastrointestinal: Negative.   Endocrine: Negative.   Genitourinary: Negative.   Musculoskeletal: Positive for joint swelling.       Left & right knee pain  Skin: Negative.   Allergic/Immunologic: Negative.   Neurological: Positive for headaches.  Hematological: Negative.   Psychiatric/Behavioral: Negative.   All other systems reviewed and are negative.      Objective:   Physical Exam  General: No acute distress HEENT: EOMI, oral membranes moist Cards: reg rate  Chest: normal effort Abdomen: Soft, NT, ND Skin: dry, intact Extremities: no edema  Neuro:fair insight and awareness.  Musculoskeletal:right knee still tender with crepitus during PROM. Antalgic. + effusion.  Left knee meniscal maneuvers.  Psych: pleasant, a little impulsive          Assessment & Plan:  1. Multiple trauma with TBI             -Continues to progress with TBI             -keeping a schedule and routine is key.             -needs to handle one thing at a time, no multi-tasking              - encouraged part time work to help with social reintegration, basic financnial needs.  2. Closed Bicondylar Fracture of Right Tibial Plateau:                        -both knees still tender               -diclofenac and gabapentin helpful--continue   -appropriate shoes and HEP 3. Cervical Fracture ( C7): cleared from a NS standpoing 4. Insomnia, nightmares/post-TBI behavioral changes         -continue nortriptyline at 25mg  qhs        -Maintain celexa 20mg  qhs for  depression                                           -  continue with neuropsych counseling        -have added seroquel to help with sleep, hs hallucinations.     15 minutes of face to face patient care time were spent during this visit. All questions were encouraged and answered.  Follow up with me in 3  mos .

## 2020-05-03 ENCOUNTER — Encounter: Payer: Self-pay | Admitting: Psychology

## 2020-05-03 ENCOUNTER — Other Ambulatory Visit: Payer: Self-pay

## 2020-05-03 ENCOUNTER — Encounter: Payer: Medicaid Other | Admitting: Psychology

## 2020-05-03 DIAGNOSIS — G894 Chronic pain syndrome: Secondary | ICD-10-CM | POA: Diagnosis not present

## 2020-05-03 DIAGNOSIS — R4689 Other symptoms and signs involving appearance and behavior: Secondary | ICD-10-CM | POA: Diagnosis not present

## 2020-05-03 DIAGNOSIS — F5102 Adjustment insomnia: Secondary | ICD-10-CM

## 2020-05-03 DIAGNOSIS — M25562 Pain in left knee: Secondary | ICD-10-CM | POA: Diagnosis not present

## 2020-05-03 DIAGNOSIS — S069X0S Unspecified intracranial injury without loss of consciousness, sequela: Secondary | ICD-10-CM

## 2020-05-03 DIAGNOSIS — S069X9S Unspecified intracranial injury with loss of consciousness of unspecified duration, sequela: Secondary | ICD-10-CM | POA: Diagnosis not present
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
## 2020-05-04 ENCOUNTER — Other Ambulatory Visit: Payer: Self-pay | Admitting: Nurse Practitioner

## 2020-05-04 DIAGNOSIS — R350 Frequency of micturition: Secondary | ICD-10-CM

## 2020-05-07 ENCOUNTER — Encounter: Payer: Self-pay | Admitting: Psychology
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
## 2020-05-14 MED FILL — QUETIAPINE FUMARATE 50 MG T: 50 | 90 days supply | Qty: 90 | Fill #0

## 2020-05-17 ENCOUNTER — Encounter: Payer: Medicaid Other | Attending: Registered Nurse | Admitting: Psychology

## 2020-05-17 ENCOUNTER — Encounter: Payer: Self-pay | Admitting: Psychology

## 2020-05-17 ENCOUNTER — Other Ambulatory Visit: Payer: Self-pay

## 2020-05-17 DIAGNOSIS — M25562 Pain in left knee: Secondary | ICD-10-CM | POA: Insufficient documentation

## 2020-05-17 DIAGNOSIS — T1490XA Injury, unspecified, initial encounter: Secondary | ICD-10-CM | POA: Diagnosis present

## 2020-05-17 DIAGNOSIS — S069X9S Unspecified intracranial injury with loss of consciousness of unspecified duration, sequela: Secondary | ICD-10-CM | POA: Insufficient documentation

## 2020-05-17 DIAGNOSIS — R4689 Other symptoms and signs involving appearance and behavior: Secondary | ICD-10-CM | POA: Insufficient documentation

## 2020-05-17 DIAGNOSIS — R440 Auditory hallucinations: Secondary | ICD-10-CM

## 2020-05-17 DIAGNOSIS — S82141A Displaced bicondylar fracture of right tibia, initial encounter for closed fracture: Secondary | ICD-10-CM | POA: Insufficient documentation

## 2020-05-17 DIAGNOSIS — G894 Chronic pain syndrome: Secondary | ICD-10-CM

## 2020-05-17 DIAGNOSIS — F5102 Adjustment insomnia: Secondary | ICD-10-CM

## 2020-05-17 DIAGNOSIS — G8918 Other acute postprocedural pain: Secondary | ICD-10-CM | POA: Insufficient documentation

## 2020-05-17 DIAGNOSIS — S069X0S Unspecified intracranial injury without loss of consciousness, sequela: Secondary | ICD-10-CM | POA: Diagnosis present

## 2020-05-17 DIAGNOSIS — G8929 Other chronic pain: Secondary | ICD-10-CM | POA: Insufficient documentation

## 2020-05-17 DIAGNOSIS — S82142S Displaced bicondylar fracture of left tibia, sequela: Secondary | ICD-10-CM | POA: Insufficient documentation
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
## 2020-06-08 IMAGING — CR PORTABLE RIGHT KNEE - 1-2 VIEW
2 series · 2 of 2 positions shown · non-contrast
Comparison: 02/21/2019

CLINICAL DATA: Bicondylar fracture of the tibial plateau.

EXAM:
PORTABLE RIGHT KNEE - 1-2 VIEW

[AP]
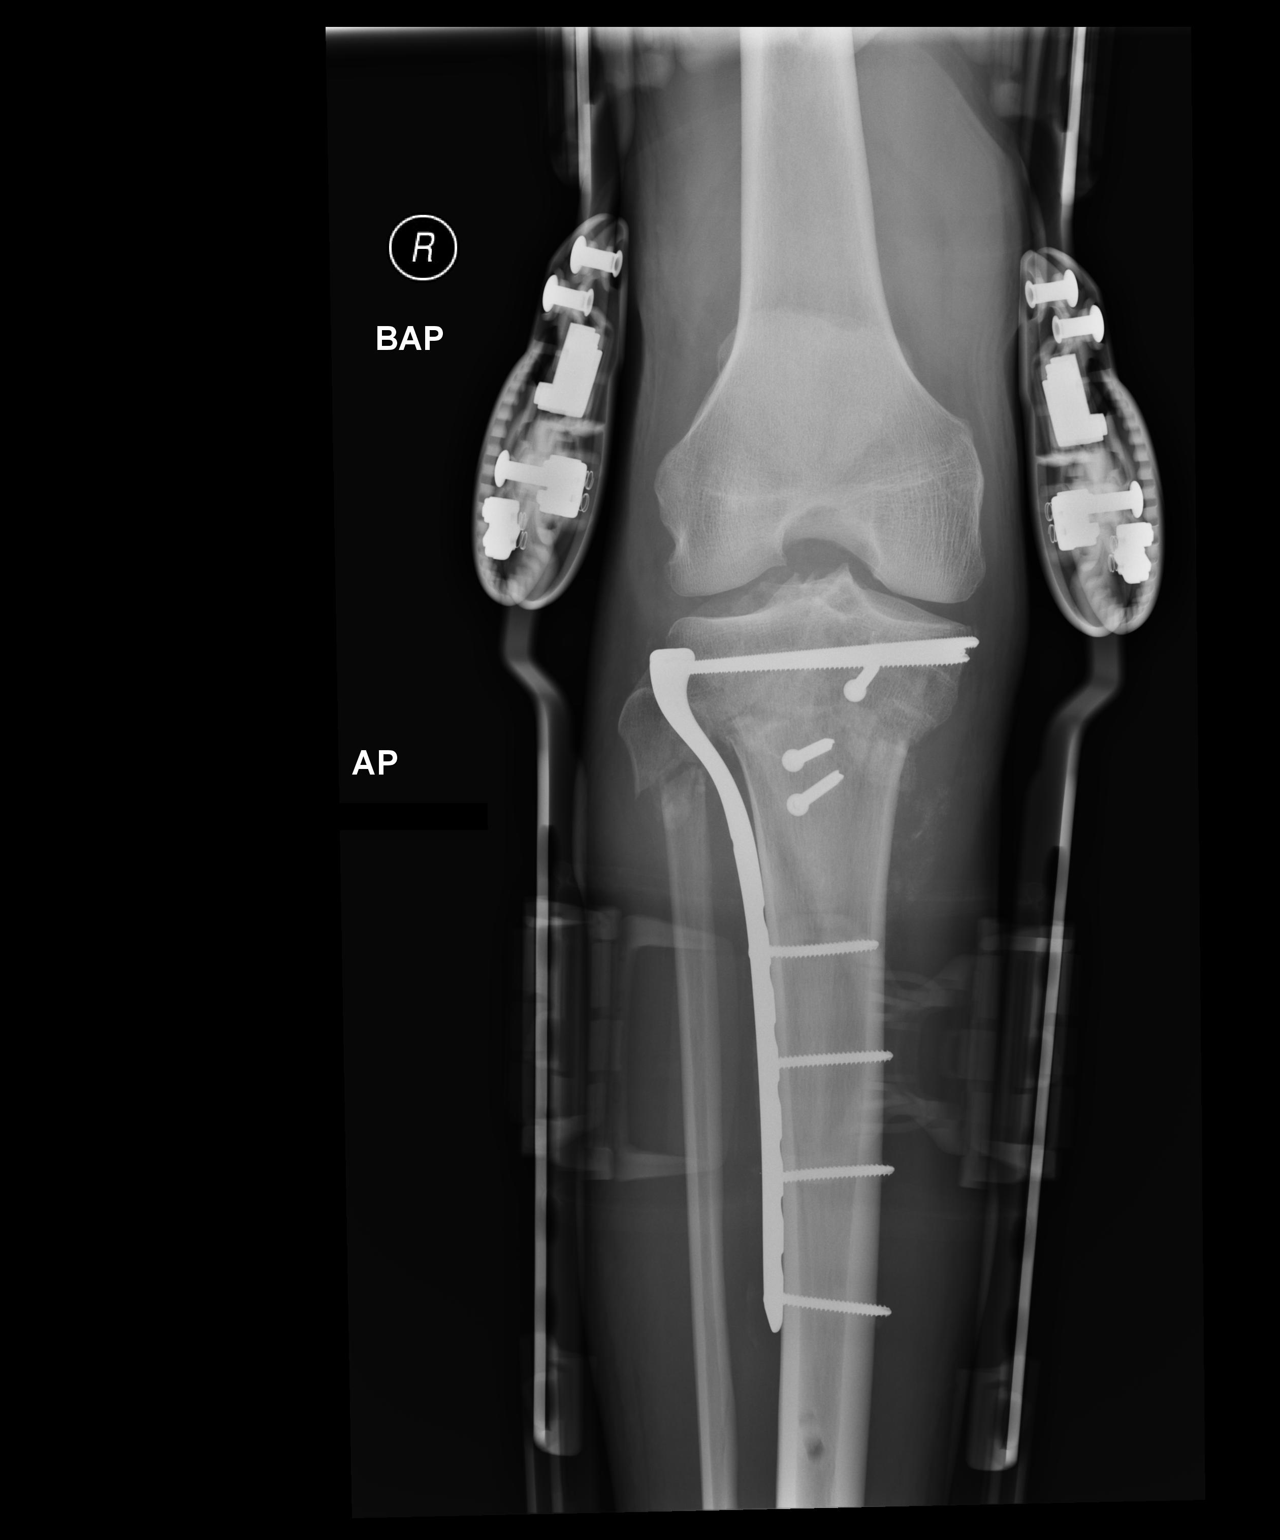

[xtable lateral]
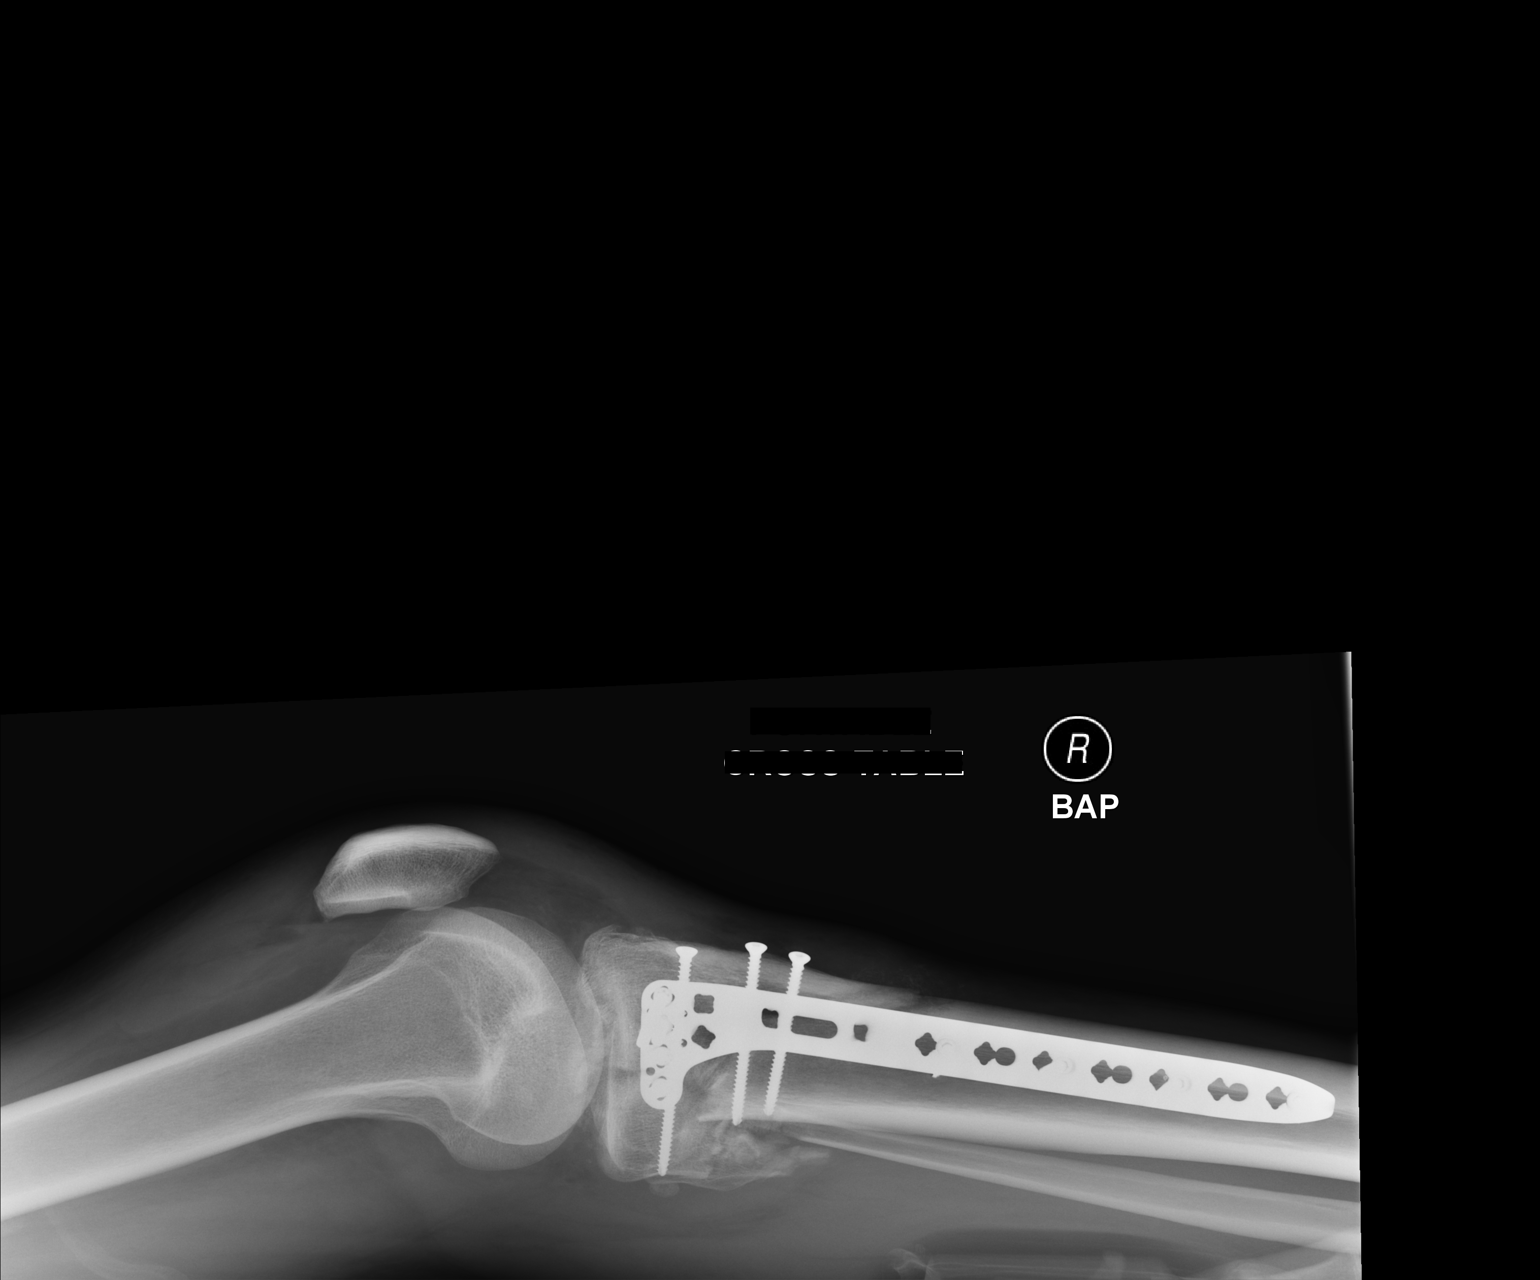

[2 of 2 positions shown; findings below may reference images not displayed]

FINDINGS: Stable alignment of the orthopedic components from sideplate and
screw fixation across from bicondylar comminuted tibial fracture. No
change in alignment. Associated comminuted impacted fibular
fracture, also stable.

Residual high density suprapatellar joint effusion. Anterior soft
tissue swelling.
IMPRESSION: 1. Stable alignment of the orthopedic components from bicondylar
comminuted tibial fracture fixation.
2. Associated comminuted impacted fibular fracture, also stable.
3. Residual high density suprapatellar joint effusion.

## 2020-07-24 ENCOUNTER — Encounter: Payer: Medicaid Other | Attending: Registered Nurse | Admitting: Psychology

## 2020-07-24 ENCOUNTER — Other Ambulatory Visit: Payer: Self-pay

## 2020-07-24 DIAGNOSIS — R440 Auditory hallucinations: Secondary | ICD-10-CM | POA: Diagnosis not present

## 2020-07-24 DIAGNOSIS — I609 Nontraumatic subarachnoid hemorrhage, unspecified: Secondary | ICD-10-CM | POA: Insufficient documentation

## 2020-07-24 DIAGNOSIS — G894 Chronic pain syndrome: Secondary | ICD-10-CM

## 2020-07-24 DIAGNOSIS — S066X9S Traumatic subarachnoid hemorrhage with loss of consciousness of unspecified duration, sequela: Secondary | ICD-10-CM | POA: Diagnosis not present

## 2020-07-24 DIAGNOSIS — M25562 Pain in left knee: Secondary | ICD-10-CM | POA: Insufficient documentation

## 2020-07-24 DIAGNOSIS — R4689 Other symptoms and signs involving appearance and behavior: Secondary | ICD-10-CM | POA: Diagnosis not present

## 2020-07-24 DIAGNOSIS — S069X0S Unspecified intracranial injury without loss of consciousness, sequela: Secondary | ICD-10-CM | POA: Diagnosis present

## 2020-07-24 DIAGNOSIS — F5102 Adjustment insomnia: Secondary | ICD-10-CM | POA: Diagnosis not present

## 2020-07-24 DIAGNOSIS — G8929 Other chronic pain: Secondary | ICD-10-CM | POA: Diagnosis present

## 2020-07-24 DIAGNOSIS — S069XAS Unspecified intracranial injury with loss of consciousness status unknown, sequela: Secondary | ICD-10-CM

## 2020-07-24 DIAGNOSIS — S069X9S Unspecified intracranial injury with loss of consciousness of unspecified duration, sequela: Secondary | ICD-10-CM | POA: Diagnosis not present

## 2020-07-25 ENCOUNTER — Encounter: Payer: Self-pay | Admitting: Psychology
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
## 2020-08-01 ENCOUNTER — Encounter: Payer: Medicaid Other | Admitting: Physical Medicine & Rehabilitation

## 2020-08-14 ENCOUNTER — Encounter: Payer: Medicaid Other | Admitting: Psychology

## 2020-08-14 ENCOUNTER — Other Ambulatory Visit: Payer: Self-pay

## 2020-08-14 ENCOUNTER — Encounter: Payer: Self-pay | Admitting: Psychology

## 2020-08-14 DIAGNOSIS — M25562 Pain in left knee: Secondary | ICD-10-CM

## 2020-08-14 DIAGNOSIS — S069X0S Unspecified intracranial injury without loss of consciousness, sequela: Secondary | ICD-10-CM

## 2020-08-14 DIAGNOSIS — G894 Chronic pain syndrome: Secondary | ICD-10-CM | POA: Diagnosis not present

## 2020-08-14 DIAGNOSIS — F5102 Adjustment insomnia: Secondary | ICD-10-CM | POA: Diagnosis not present

## 2020-08-14 DIAGNOSIS — S066X9S Traumatic subarachnoid hemorrhage with loss of consciousness of unspecified duration, sequela: Secondary | ICD-10-CM | POA: Diagnosis not present

## 2020-08-14 DIAGNOSIS — R4689 Other symptoms and signs involving appearance and behavior: Secondary | ICD-10-CM

## 2020-08-14 DIAGNOSIS — R440 Auditory hallucinations: Secondary | ICD-10-CM

## 2020-08-14 DIAGNOSIS — S069X9S Unspecified intracranial injury with loss of consciousness of unspecified duration, sequela: Secondary | ICD-10-CM | POA: Diagnosis not present

## 2020-08-14 DIAGNOSIS — I609 Nontraumatic subarachnoid hemorrhage, unspecified: Secondary | ICD-10-CM

## 2020-08-14 DIAGNOSIS — G8929 Other chronic pain: Secondary | ICD-10-CM
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
## 2020-08-28 DIAGNOSIS — S82151D Displaced fracture of right tibial tuberosity, subsequent encounter for closed fracture with routine healing: Secondary | ICD-10-CM | POA: Diagnosis not present

## 2020-09-05 ENCOUNTER — Encounter: Payer: Medicaid Other | Admitting: Physical Medicine & Rehabilitation

## 2020-09-12 ENCOUNTER — Other Ambulatory Visit: Payer: Self-pay | Admitting: Student

## 2020-09-12 DIAGNOSIS — M25561 Pain in right knee: Secondary | ICD-10-CM

## 2020-10-05 ENCOUNTER — Other Ambulatory Visit: Payer: Medicaid Other

## 2020-10-17 ENCOUNTER — Encounter: Payer: Medicaid Other | Attending: Registered Nurse | Admitting: Psychology

## 2020-10-17 ENCOUNTER — Other Ambulatory Visit: Payer: Self-pay

## 2020-10-17 DIAGNOSIS — G894 Chronic pain syndrome: Secondary | ICD-10-CM

## 2020-10-17 DIAGNOSIS — Z5901 Sheltered homelessness: Secondary | ICD-10-CM | POA: Insufficient documentation

## 2020-10-17 DIAGNOSIS — G8929 Other chronic pain: Secondary | ICD-10-CM | POA: Diagnosis present

## 2020-10-17 DIAGNOSIS — Z79899 Other long term (current) drug therapy: Secondary | ICD-10-CM | POA: Insufficient documentation

## 2020-10-17 DIAGNOSIS — S066X9S Traumatic subarachnoid hemorrhage with loss of consciousness of unspecified duration, sequela: Secondary | ICD-10-CM | POA: Insufficient documentation

## 2020-10-17 DIAGNOSIS — I609 Nontraumatic subarachnoid hemorrhage, unspecified: Secondary | ICD-10-CM | POA: Diagnosis present

## 2020-10-17 DIAGNOSIS — S82141S Displaced bicondylar fracture of right tibia, sequela: Secondary | ICD-10-CM | POA: Insufficient documentation

## 2020-10-17 DIAGNOSIS — M25562 Pain in left knee: Secondary | ICD-10-CM | POA: Diagnosis not present

## 2020-10-17 DIAGNOSIS — R413 Other amnesia: Secondary | ICD-10-CM | POA: Insufficient documentation

## 2020-10-17 DIAGNOSIS — Z560 Unemployment, unspecified: Secondary | ICD-10-CM | POA: Insufficient documentation

## 2020-10-17 DIAGNOSIS — R440 Auditory hallucinations: Secondary | ICD-10-CM

## 2020-10-17 DIAGNOSIS — R4689 Other symptoms and signs involving appearance and behavior: Secondary | ICD-10-CM | POA: Diagnosis present

## 2020-10-17 DIAGNOSIS — S069X9S Unspecified intracranial injury with loss of consciousness of unspecified duration, sequela: Secondary | ICD-10-CM

## 2020-10-17 DIAGNOSIS — F5102 Adjustment insomnia: Secondary | ICD-10-CM | POA: Diagnosis not present

## 2020-10-17 DIAGNOSIS — R4189 Other symptoms and signs involving cognitive functions and awareness: Secondary | ICD-10-CM | POA: Insufficient documentation

## 2020-10-17 DIAGNOSIS — F17210 Nicotine dependence, cigarettes, uncomplicated: Secondary | ICD-10-CM | POA: Insufficient documentation

## 2020-10-17 DIAGNOSIS — F918 Other conduct disorders: Secondary | ICD-10-CM | POA: Diagnosis not present

## 2020-10-17 DIAGNOSIS — S069X0S Unspecified intracranial injury without loss of consciousness, sequela: Secondary | ICD-10-CM | POA: Insufficient documentation

## 2020-10-20 ENCOUNTER — Other Ambulatory Visit: Payer: Medicaid Other

## 2020-10-21 ENCOUNTER — Encounter: Payer: Self-pay | Admitting: Psychology
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
## 2020-10-23 ENCOUNTER — Encounter: Payer: Self-pay | Admitting: *Deleted

## 2020-10-24 ENCOUNTER — Other Ambulatory Visit: Payer: Self-pay

## 2020-10-24 ENCOUNTER — Other Ambulatory Visit: Payer: Self-pay | Admitting: Physical Medicine & Rehabilitation

## 2020-10-24 ENCOUNTER — Encounter: Payer: Medicaid Other | Admitting: Physical Medicine & Rehabilitation

## 2020-10-24 ENCOUNTER — Encounter: Payer: Self-pay | Admitting: Physical Medicine & Rehabilitation

## 2020-10-24 VITALS — BP 131/81 | HR 65 | Temp 99.0°F | Ht 72.5 in | Wt 137.0 lb

## 2020-10-24 DIAGNOSIS — F5102 Adjustment insomnia: Secondary | ICD-10-CM | POA: Diagnosis not present

## 2020-10-24 DIAGNOSIS — R4689 Other symptoms and signs involving appearance and behavior: Secondary | ICD-10-CM

## 2020-10-24 DIAGNOSIS — S069X9S Unspecified intracranial injury with loss of consciousness of unspecified duration, sequela: Secondary | ICD-10-CM | POA: Diagnosis not present

## 2020-10-24 DIAGNOSIS — S069X0S Unspecified intracranial injury without loss of consciousness, sequela: Secondary | ICD-10-CM

## 2020-10-24 DIAGNOSIS — S066X9S Traumatic subarachnoid hemorrhage with loss of consciousness of unspecified duration, sequela: Secondary | ICD-10-CM | POA: Diagnosis not present

## 2020-10-24 MED ORDER — QUETIAPINE FUMARATE 50 MG PO TABS: 50.0000 mg | ORAL_TABLET | Freq: Three times a day (TID) | ORAL | 2 refills | Status: DC

## 2020-10-24 NOTE — Progress Notes (Signed)
Subjective:    Patient ID: Gary Ashley, male    DOB: 03-27-1981, 40 y.o.   MRN: [id number]  HPI   Gary Ashley is here in follow up of his TBI. He tells me that he is still having mood swings, frequent auditory hallucination. He may wake up from sleep with sweats. He is still having nightmares. He feels that his fingers are painful and "arthritic" but numb as well. He feels stressed by financial matters, his disability application,  and attempts to work which haven't gone well. He continues to see Dr. Kieth Brightly about these cognitive-behavioral issues.   Gary Ashley tellsme that ortho has scheduled an MRI of his right knee which still gives him a lot of problems.  Pain Inventory Average Pain 7 Pain Right Now 7 My pain is sharp, burning and aching  In the last 24 hours, has pain interfered with the following? General activity 10 Relation with others 10 Enjoyment of life 10 What TIME of day is your pain at its worst? daytime and night Sleep (in general) Poor  Pain is worse with: walking, inactivity, standing and some activites Pain improves with: rest and medication Relief from Meds: no pain meds  Family History  Problem Relation Age of Onset  . Healthy Mother   . Healthy Father    Social History   Socioeconomic History  . Marital status: Single    Spouse name: Not on file  . Number of children: Not on file  . Years of education: Not on file  . Highest education level: Not on file  Occupational History  . Not on file  Tobacco Use  . Smoking status: Current Some Day Smoker    Types: Cigarettes  . Smokeless tobacco: Never Used  Vaping Use  . Vaping Use: Never used  Substance and Sexual Activity  . Alcohol use: Not Currently  . Drug use: Yes    Types: Marijuana    Comment: sometimes  . Sexual activity: Yes  Other Topics Concern  . Not on file  Social History Narrative  . Not on file   Social Determinants of Health   Financial Resource Strain: Not on file  Food  Insecurity: Not on file  Transportation Needs: Not on file  Physical Activity: Not on file  Stress: Not on file  Social Connections: Not on file   Past Surgical History:  Procedure Laterality Date  . APPENDECTOMY    . EXTERNAL FIXATION LEG Bilateral 02/18/2019   Procedure: EXTERNAL FIXATION RIGHT LEG, I&D LEFT LEG;  Surgeon: Roby Lofts, MD;  Location: [facility] OR;  Service: Orthopedics;  Laterality: Bilateral;  . ORIF TIBIA PLATEAU Right 02/21/2019   Procedure: OPEN REDUCTION INTERNAL FIXATION (ORIF) TIBIAL PLATEAU;  Surgeon: Roby Lofts, MD;  Location: [facility] OR;  Service: Orthopedics;  Laterality: Right;   Past Surgical History:  Procedure Laterality Date  . APPENDECTOMY    . EXTERNAL FIXATION LEG Bilateral 02/18/2019   Procedure: EXTERNAL FIXATION RIGHT LEG, I&D LEFT LEG;  Surgeon: Roby Lofts, MD;  Location: [facility] OR;  Service: Orthopedics;  Laterality: Bilateral;  . ORIF TIBIA PLATEAU Right 02/21/2019   Procedure: OPEN REDUCTION INTERNAL FIXATION (ORIF) TIBIAL PLATEAU;  Surgeon: Roby Lofts, MD;  Location: [facility] OR;  Service: Orthopedics;  Laterality: Right;   Past Medical History:  Diagnosis Date  . Bilateral pneumothorax   . C7 cervical fracture (HCC)   . SAH (subarachnoid hemorrhage) (HCC)   . Sinus tachycardia   . TBI (traumatic brain injury) (HCC)   .  Vitamin D deficiency    BP 131/81   Pulse 65   Temp 99 F (37.2 C)   Ht 6' 0.5" (1.842 m)   Wt 137 lb (62.1 kg)   SpO2 98%   BMI 18.33 kg/m   Opioid Risk Score:   Fall Risk Score:  `1  Depression screen PHQ 2/9  Depression screen [facility] 2/9 05/02/2020 02/29/2020 10/05/2019 06/10/2019 03/16/2019 03/15/2019  Decreased Interest 0 3 3 2 3 3   Down, Depressed, Hopeless 0 3 3 2 3 3   PHQ - 2 Score 0 6 6 4 6 6   Altered sleeping - 3 2 3 3 3   Tired, decreased energy - 3 2 3  0 3  Change in appetite - 0 0 0 0 0  Feeling bad or failure about yourself  - 1 2 0 3 3  Trouble concentrating - 3 2 0 3 3  Moving slowly or fidgety/restless - 3  2 2  0 3  Suicidal thoughts - 3 1 2  0 0  PHQ-9 Score - 22 17 14 15 21   Difficult doing work/chores - - Very difficult - - -    Review of Systems  Constitutional: Negative.   HENT: Negative.   Eyes: Positive for pain.  Respiratory: Negative.   Cardiovascular: Negative.   Gastrointestinal: Negative.   Endocrine: Negative.   Musculoskeletal: Positive for arthralgias and back pain.  Skin: Negative.   Neurological: Positive for dizziness, seizures, syncope, light-headedness and headaches.  Hematological: Negative.   Psychiatric/Behavioral: Negative.   All other systems reviewed and are negative.      Objective:   Physical Exam  General: No acute distress HEENT: EOMI, oral membranes moist Cards: reg rate  Chest: normal effort Abdomen: Soft, NT, ND Skin: dry, intact Extremities: no edema Psych:anxious. Actively hallucinating in room--speaking to someone in corner and then able to quickly re-train his attention Neuro:distracted. Poor concentration. STM deficits.  No cn findings. Balance fair. Strength 4-5/5.  Musculoskeletal:right knee still tender with crepitus during PROM. Significant antalgia RLE            Assessment & Plan:  1. Multiple trauma with TBI             -asked him to focus on his physical and emotional well being, the relationships at home   -hold on vocational activities for now--he's non-vocational 2. Closed Bicondylar Fracture of Right Tibial Plateau:        -MRI per ortho pending                      -both knees still tender               -diclofenac and gabapentin helpful--continue               -appropriate shoes and HEP 3. Cervical Fracture ( C7): cleared from a NS standpoing 4. Insomnia,auditory hallucinations-TBI behavioral changes         -continue nortriptyline at 25mg  qhs        -Maintain celexa 20mg  qhs for depression                                           -continue with neuropsych counseling                                          -  increased seroquel to 100mg  qhs and 50mg  qam       -asked him to call me in 2 weeks if symptoms aren't improving.    Fifteen minutes of face to face patient care time were spent during this visit. All questions were encouraged and answered.  Follow up with me in 6 WEEKS .

## 2020-10-24 NOTE — Patient Instructions (Signed)
IF THINGS DON'T IMPROVE OVER THE NEXT 2 WEEKS IN REGARD TO YOUR SLEEP OR HALLUCINATIONS, PLEASE CALL ME.    PLEASE FOCUS ON YOUR SLEEP AND RELATIONSHIPS WITH FAMILY AND DON'T WORRY ABOUT WORK/MONEY RIGHT NOW.

## 2020-10-31 MED FILL — QUETIAPINE FUMARATE 50 MG T: 50 | 30 days supply | Qty: 90 | Fill #0

## 2020-11-01 ENCOUNTER — Other Ambulatory Visit: Payer: Medicaid Other

## 2020-11-14 ENCOUNTER — Other Ambulatory Visit: Payer: Self-pay

## 2020-11-14 ENCOUNTER — Encounter: Payer: Medicaid Other | Attending: Registered Nurse | Admitting: Psychology

## 2020-11-14 DIAGNOSIS — T1490XA Injury, unspecified, initial encounter: Secondary | ICD-10-CM | POA: Diagnosis not present

## 2020-11-14 DIAGNOSIS — R4689 Other symptoms and signs involving appearance and behavior: Secondary | ICD-10-CM | POA: Insufficient documentation

## 2020-11-14 DIAGNOSIS — S069X0S Unspecified intracranial injury without loss of consciousness, sequela: Secondary | ICD-10-CM | POA: Diagnosis not present

## 2020-11-14 DIAGNOSIS — I609 Nontraumatic subarachnoid hemorrhage, unspecified: Secondary | ICD-10-CM | POA: Insufficient documentation

## 2020-11-14 MED FILL — QUETIAPINE FUMARATE 50 MG T: 50 | 30 days supply | Qty: 90 | Fill #0

## 2020-11-24 ENCOUNTER — Encounter: Payer: Self-pay | Admitting: Psychology
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
## 2020-11-26 ENCOUNTER — Ambulatory Visit
Admission: RE | Admit: 2020-11-26 | Discharge: 2020-11-26 | Disposition: A | Discharge status: Home / Self Care | Payer: Medicaid Other | Source: Ambulatory Visit | Attending: Student | Admitting: Student

## 2020-11-26 DIAGNOSIS — M25561 Pain in right knee: Secondary | ICD-10-CM | POA: Diagnosis not present

## 2020-12-18 DIAGNOSIS — M25561 Pain in right knee: Secondary | ICD-10-CM | POA: Diagnosis not present

## 2020-12-19 ENCOUNTER — Encounter: Payer: Medicaid Other | Attending: Registered Nurse | Admitting: Psychology

## 2020-12-19 ENCOUNTER — Other Ambulatory Visit: Payer: Self-pay

## 2020-12-19 DIAGNOSIS — I609 Nontraumatic subarachnoid hemorrhage, unspecified: Secondary | ICD-10-CM | POA: Insufficient documentation

## 2020-12-19 DIAGNOSIS — S82142S Displaced bicondylar fracture of left tibia, sequela: Secondary | ICD-10-CM | POA: Diagnosis not present

## 2020-12-19 DIAGNOSIS — T1490XA Injury, unspecified, initial encounter: Secondary | ICD-10-CM | POA: Insufficient documentation

## 2020-12-19 DIAGNOSIS — S069X9S Unspecified intracranial injury with loss of consciousness of unspecified duration, sequela: Secondary | ICD-10-CM

## 2020-12-19 DIAGNOSIS — R4689 Other symptoms and signs involving appearance and behavior: Secondary | ICD-10-CM | POA: Diagnosis not present

## 2020-12-19 DIAGNOSIS — S82141A Displaced bicondylar fracture of right tibia, initial encounter for closed fracture: Secondary | ICD-10-CM | POA: Insufficient documentation

## 2020-12-19 DIAGNOSIS — R440 Auditory hallucinations: Secondary | ICD-10-CM | POA: Diagnosis not present

## 2020-12-19 DIAGNOSIS — S069X0S Unspecified intracranial injury without loss of consciousness, sequela: Secondary | ICD-10-CM | POA: Diagnosis not present

## 2020-12-19 DIAGNOSIS — G894 Chronic pain syndrome: Secondary | ICD-10-CM | POA: Insufficient documentation

## 2020-12-19 DIAGNOSIS — F5102 Adjustment insomnia: Secondary | ICD-10-CM | POA: Diagnosis not present

## 2020-12-23 ENCOUNTER — Encounter: Payer: Self-pay | Admitting: Psychology
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
## 2020-12-25 ENCOUNTER — Encounter (HOSPITAL_BASED_OUTPATIENT_CLINIC_OR_DEPARTMENT_OTHER): Payer: Self-pay | Admitting: Orthopaedic Surgery

## 2020-12-25 ENCOUNTER — Other Ambulatory Visit: Payer: Self-pay

## 2020-12-26 ENCOUNTER — Other Ambulatory Visit: Payer: Self-pay

## 2020-12-26 ENCOUNTER — Encounter: Payer: Self-pay | Admitting: Physical Medicine & Rehabilitation

## 2020-12-26 ENCOUNTER — Other Ambulatory Visit (HOSPITAL_COMMUNITY): Payer: Medicaid Other

## 2020-12-26 ENCOUNTER — Encounter: Payer: Medicaid Other | Admitting: Physical Medicine & Rehabilitation

## 2020-12-26 VITALS — BP 141/104 | HR 99 | Temp 98.3°F | Ht 72.0 in | Wt 131.0 lb

## 2020-12-26 DIAGNOSIS — S069X0S Unspecified intracranial injury without loss of consciousness, sequela: Secondary | ICD-10-CM

## 2020-12-26 DIAGNOSIS — R4689 Other symptoms and signs involving appearance and behavior: Secondary | ICD-10-CM

## 2020-12-26 DIAGNOSIS — S82141A Displaced bicondylar fracture of right tibia, initial encounter for closed fracture: Secondary | ICD-10-CM | POA: Diagnosis not present

## 2020-12-26 DIAGNOSIS — I609 Nontraumatic subarachnoid hemorrhage, unspecified: Secondary | ICD-10-CM | POA: Diagnosis not present

## 2020-12-26 DIAGNOSIS — F5102 Adjustment insomnia: Secondary | ICD-10-CM

## 2020-12-26 DIAGNOSIS — R440 Auditory hallucinations: Secondary | ICD-10-CM | POA: Diagnosis not present

## 2020-12-26 DIAGNOSIS — T1490XA Injury, unspecified, initial encounter: Secondary | ICD-10-CM | POA: Diagnosis not present

## 2020-12-26 DIAGNOSIS — S82142S Displaced bicondylar fracture of left tibia, sequela: Secondary | ICD-10-CM | POA: Diagnosis not present

## 2020-12-26 DIAGNOSIS — G894 Chronic pain syndrome: Secondary | ICD-10-CM | POA: Diagnosis not present

## 2020-12-26 DIAGNOSIS — S069X9S Unspecified intracranial injury with loss of consciousness of unspecified duration, sequela: Secondary | ICD-10-CM | POA: Diagnosis not present

## 2020-12-26 DIAGNOSIS — S069XAS Unspecified intracranial injury with loss of consciousness status unknown, sequela: Secondary | ICD-10-CM

## 2020-12-26 MED ORDER — QUETIAPINE FUMARATE 50 MG PO TABS
50.0000 mg | ORAL_TABLET | ORAL | 2 refills | Status: DC
  Filled 2020-12-26 – 2021-01-29 (×4): qty 120, 30d supply, fill #0
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
## 2020-12-26 NOTE — Patient Instructions (Signed)
PLEASE FEEL FREE TO CALL OUR OFFICE WITH ANY PROBLEMS OR QUESTIONS ([phone])      

## 2020-12-31 ENCOUNTER — Other Ambulatory Visit: Payer: Self-pay

## 2020-12-31 ENCOUNTER — Other Ambulatory Visit (HOSPITAL_COMMUNITY)
Admission: RE | Admit: 2020-12-31 | Discharge: 2020-12-31 | Disposition: A | Discharge status: Home / Self Care | Payer: Medicaid Other | Source: Ambulatory Visit | Attending: Orthopaedic Surgery | Admitting: Orthopaedic Surgery

## 2020-12-31 ENCOUNTER — Encounter (HOSPITAL_BASED_OUTPATIENT_CLINIC_OR_DEPARTMENT_OTHER)
Admission: RE | Admit: 2020-12-31 | Discharge: 2020-12-31 | Disposition: A | Discharge status: Home / Self Care | Payer: Medicaid Other | Source: Ambulatory Visit | Attending: Orthopaedic Surgery | Admitting: Orthopaedic Surgery

## 2020-12-31 DIAGNOSIS — Z20822 Contact with and (suspected) exposure to covid-19: Secondary | ICD-10-CM | POA: Diagnosis not present

## 2020-12-31 DIAGNOSIS — Z01812 Encounter for preprocedural laboratory examination: Secondary | ICD-10-CM | POA: Diagnosis not present

## 2020-12-31 DIAGNOSIS — Z01810 Encounter for preprocedural cardiovascular examination: Secondary | ICD-10-CM | POA: Insufficient documentation

## 2020-12-31 NOTE — Progress Notes (Signed)

## 2021-01-01 LAB — SARS CORONAVIRUS 2 (TAT 6-24 HRS): SARS Coronavirus 2: NEGATIVE

## 2021-01-07 ENCOUNTER — Other Ambulatory Visit: Payer: Self-pay

## 2021-01-07 ENCOUNTER — Ambulatory Visit: Payer: Medicaid Other

## 2021-01-07 ENCOUNTER — Other Ambulatory Visit (HOSPITAL_COMMUNITY): Payer: Medicaid Other

## 2021-01-09 ENCOUNTER — Telehealth: Payer: Self-pay | Admitting: *Deleted

## 2021-01-09 NOTE — Telephone Encounter (Addendum)
Prior auth submitted to [facility] via CoverMyMeds. [place] Case: [id number], Status: Approved, Coverage Starts on: 01/09/2021 12:00:00 AM, Coverage Ends on: 01/09/2022 12:00:00 AM. Pharmacy and Mr [facility] notified.

## 2021-01-09 NOTE — Progress Notes (Signed)
Spoke in detail with patient about arriving early around 0800 tomorrow for his surgery so that we could perform a rapid covid test and have him ready in time for his schedule procedure. Pt stated that his medicaid transportation pick up time had been moved up to 0730 with an ETA of 0800 here at [facility]. Lab is aware of this need.

## 2021-01-09 NOTE — H&P (Signed)
PREOPERATIVE H&P  Chief Complaint: RIGHT KNEE CHONDROMALCIAL  MENISCUS TEAR  HPI: Gary Ashley is a 40 y.o. male who is scheduled for, Procedure(s): RIGHT KNEE ARTHROSCOPY WITH LATERAL MENISECTOMY DEBRIDEMENT CHONDROPLASTY  LYSIS OF ADHESIONS WITH MANIPULATION.   Patient has a past medical history significant for TBI, SAH, and HTN   The patient is a 40 year old who had a traumatic brain injury amongst other injuries. She had a right lateral plateau fracture, open reduction and internal fixation in 2020. He has continued to have pain along the lateral aspect of his knee. He is limited on his range of motion. He is unhappy with his overall function currently  His symptoms are rated as moderate to severe, and have been worsening.  This is significantly impairing activities of daily living.    Please see clinic note for further details on this patient's care.    He has elected for surgical management.   Past Medical History:  Diagnosis Date  . Anxiety   . Bilateral pneumothorax   . C7 cervical fracture (HCC)   . Depression   . Hypertension   . SAH (subarachnoid hemorrhage) (HCC)   . Sinus tachycardia   . TBI (traumatic brain injury) (HCC)   . Vitamin D deficiency    Past Surgical History:  Procedure Laterality Date  . APPENDECTOMY    . EXTERNAL FIXATION LEG Bilateral 02/18/2019   Procedure: EXTERNAL FIXATION RIGHT LEG, I&D LEFT LEG;  Surgeon: Roby Lofts, MD;  Location: [facility] OR;  Service: Orthopedics;  Laterality: Bilateral;  . ORIF TIBIA PLATEAU Right 02/21/2019   Procedure: OPEN REDUCTION INTERNAL FIXATION (ORIF) TIBIAL PLATEAU;  Surgeon: Roby Lofts, MD;  Location: [facility] OR;  Service: Orthopedics;  Laterality: Right;   Social History   Socioeconomic History  . Marital status: Single    Spouse name: Not on file  . Number of children: Not on file  . Years of education: Not on file  . Highest education level: Not on file  Occupational History  . Not on file   Tobacco Use  . Smoking status: Current Some Day Smoker    Types: Cigarettes  . Smokeless tobacco: Never Used  Vaping Use  . Vaping Use: Never used  Substance and Sexual Activity  . Alcohol use: Not Currently  . Drug use: Not Currently    Types: Marijuana    Comment: sometimes  . Sexual activity: Yes  Other Topics Concern  . Not on file  Social History Narrative  . Not on file   Social Determinants of Health   Financial Resource Strain: Not on file  Food Insecurity: Not on file  Transportation Needs: Not on file  Physical Activity: Not on file  Stress: Not on file  Social Connections: Not on file   Family History  Problem Relation Age of Onset  . Healthy Mother   . Healthy Father    Allergies  Allergen Reactions  . Penicillins     Swelling    Prior to Admission medications   Medication Sig Start Date End Date Taking? Authorizing Provider  diclofenac (VOLTAREN) 75 MG EC tablet Take 1 tablet (75 mg total) by mouth 2 (two) times daily with a meal. 02/29/20  Yes Ranelle Oyster, MD  gabapentin (NEURONTIN) 300 MG capsule Take 1 capsule (300 mg total) by mouth 3 (three) times daily. 09/17/19  Yes Claiborne Rigg, NP  methocarbamol (ROBAXIN) 500 MG tablet Take 1-2 tablets (500-1,000 mg total) by mouth 4 (four)  times daily. 03/09/19  Yes Love, Evlyn Kanner, PA-C  metoprolol tartrate (LOPRESSOR) 25 MG tablet Take 0.5 tablets (12.5 mg total) by mouth 2 (two) times daily. 09/15/19  Yes Hoy Register, MD  nortriptyline (PAMELOR) 25 MG capsule Take 1 capsule (25 mg total) by mouth at bedtime. 10/05/19  Yes Ranelle Oyster, MD  citalopram (CELEXA) 20 MG tablet Take 1 tablet (20 mg total) by mouth at bedtime. 11/30/19 11/29/20  Ranelle Oyster, MD  QUEtiapine (SEROQUEL) 50 MG tablet Take 1 tablet by mouth in the morning and 3 tablets at bedtime 12/26/20 12/26/21  Ranelle Oyster, MD    ROS: All other systems have been reviewed and were otherwise negative with the exception of those  mentioned in the HPI and as above.  Physical Exam: General: Alert, no acute distress Cardiovascular: No pedal edema Respiratory: No cyanosis, no use of accessory musculature GI: No organomegaly, abdomen is soft and non-tender Skin: No lesions in the area of chief complaint Neurologic: Sensation intact distally Psychiatric: Patient is competent for consent with normal mood and affect Lymphatic: No axillary or cervical lymphadenopathy  MUSCULOSKELETAL:  His range of motion of the right knee is 0-110 degrees, limited by pain in flexion. He has centralization  of the lateral joint. No ligamentous instability.  Imaging: MRI is reviewed in [place] which demonstrates a parameniscal cyst and complex tearing. Possibly lateral meniscus.   Assessment: RIGHT KNEE CHONDROMALCIAL  MENISCUS TEAR  Plan: Plan for Procedure(s): RIGHT KNEE ARTHROSCOPY WITH LATERAL MENISECTOMY DEBRIDEMENT CHONDROPLASTY  LYSIS OF ADHESIONS WITH MANIPULATION  The risks benefits and alternatives were discussed with the patient including but not limited to the risks of nonoperative treatment, versus surgical intervention including infection, bleeding, nerve injury,  blood clots, cardiopulmonary complications, morbidity, mortality, among others, and they were willing to proceed.   The patient acknowledged the explanation, agreed to proceed with the plan and consent was signed.   Operative Plan: Right knee arthroscopy lateral meniscectomy, chondroplasty, lysis of adhesions Discharge Medications: Tylenol, Diclofenac, Oxycodone, Zofran DVT Prophylaxis: Aspirin Physical Therapy: Scheduled at [facility] Special Discharge needs: +/- knee immobilizer   Vernetta Honey, PA-C  01/09/2021 4:21 PM

## 2021-01-10 ENCOUNTER — Encounter (HOSPITAL_BASED_OUTPATIENT_CLINIC_OR_DEPARTMENT_OTHER)
Admission: RE | Disposition: A | Discharge status: Home / Self Care | Payer: Self-pay | Source: Home / Self Care | Attending: Orthopaedic Surgery

## 2021-01-10 ENCOUNTER — Ambulatory Visit (HOSPITAL_BASED_OUTPATIENT_CLINIC_OR_DEPARTMENT_OTHER)
Admission: RE | Admit: 2021-01-10 | Discharge: 2021-01-10 | Disposition: A | Discharge status: Home / Self Care | Payer: Medicaid Other | Attending: Orthopaedic Surgery | Admitting: Orthopaedic Surgery

## 2021-01-10 ENCOUNTER — Ambulatory Visit (HOSPITAL_BASED_OUTPATIENT_CLINIC_OR_DEPARTMENT_OTHER): Payer: Medicaid Other | Admitting: Anesthesiology

## 2021-01-10 ENCOUNTER — Other Ambulatory Visit: Payer: Self-pay

## 2021-01-10 ENCOUNTER — Encounter (HOSPITAL_BASED_OUTPATIENT_CLINIC_OR_DEPARTMENT_OTHER): Payer: Self-pay | Admitting: Orthopaedic Surgery

## 2021-01-10 DIAGNOSIS — M25561 Pain in right knee: Secondary | ICD-10-CM | POA: Diagnosis present

## 2021-01-10 DIAGNOSIS — M2241 Chondromalacia patellae, right knee: Secondary | ICD-10-CM | POA: Diagnosis not present

## 2021-01-10 DIAGNOSIS — E559 Vitamin D deficiency, unspecified: Secondary | ICD-10-CM | POA: Diagnosis not present

## 2021-01-10 DIAGNOSIS — F17210 Nicotine dependence, cigarettes, uncomplicated: Secondary | ICD-10-CM | POA: Diagnosis not present

## 2021-01-10 DIAGNOSIS — Z87820 Personal history of traumatic brain injury: Secondary | ICD-10-CM | POA: Insufficient documentation

## 2021-01-10 DIAGNOSIS — Z9889 Other specified postprocedural states: Secondary | ICD-10-CM | POA: Insufficient documentation

## 2021-01-10 DIAGNOSIS — M94261 Chondromalacia, right knee: Secondary | ICD-10-CM | POA: Insufficient documentation

## 2021-01-10 DIAGNOSIS — Z79899 Other long term (current) drug therapy: Secondary | ICD-10-CM | POA: Diagnosis not present

## 2021-01-10 DIAGNOSIS — F418 Other specified anxiety disorders: Secondary | ICD-10-CM | POA: Diagnosis not present

## 2021-01-10 DIAGNOSIS — M24661 Ankylosis, right knee: Secondary | ICD-10-CM | POA: Insufficient documentation

## 2021-01-10 DIAGNOSIS — Z20822 Contact with and (suspected) exposure to covid-19: Secondary | ICD-10-CM | POA: Diagnosis not present

## 2021-01-10 DIAGNOSIS — I10 Essential (primary) hypertension: Secondary | ICD-10-CM | POA: Diagnosis not present

## 2021-01-10 DIAGNOSIS — S83241A Other tear of medial meniscus, current injury, right knee, initial encounter: Secondary | ICD-10-CM | POA: Diagnosis not present

## 2021-01-10 DIAGNOSIS — M2341 Loose body in knee, right knee: Secondary | ICD-10-CM | POA: Diagnosis not present

## 2021-01-10 HISTORY — DX: Essential (primary) hypertension: I10

## 2021-01-10 HISTORY — DX: Anxiety disorder, unspecified: F41.9

## 2021-01-10 HISTORY — PX: KNEE ARTHROSCOPY WITH LATERAL MENISECTOMY: SHX6193

## 2021-01-10 HISTORY — DX: Depression, unspecified: F32.A

## 2021-01-10 LAB — SARS CORONAVIRUS 2 BY RT PCR (HOSPITAL ORDER, PERFORMED IN ~~LOC~~ HOSPITAL LAB): SARS Coronavirus 2: NEGATIVE

## 2021-01-10 SURGERY — ARTHROSCOPY, KNEE, WITH LATERAL MENISCECTOMY
Anesthesia: General | Site: Knee | Laterality: Right

## 2021-01-10 MED ORDER — PROPOFOL 10 MG/ML IV BOLUS
INTRAVENOUS | Status: DC | PRN
  Administered 2021-01-10: 200 mg via INTRAVENOUS

## 2021-01-10 MED ORDER — MIDAZOLAM HCL 2 MG/2ML IJ SOLN
INTRAMUSCULAR | Status: AC
  Filled 2021-01-10: qty 2

## 2021-01-10 MED ORDER — AMISULPRIDE (ANTIEMETIC) 5 MG/2ML IV SOLN: 10.0000 mg | Freq: Once | INTRAVENOUS | Status: DC | PRN

## 2021-01-10 MED ORDER — KETOROLAC TROMETHAMINE 30 MG/ML IJ SOLN
INTRAMUSCULAR | Status: DC | PRN
  Administered 2021-01-10: 30 mg via INTRAVENOUS

## 2021-01-10 MED ORDER — GLYCOPYRROLATE 0.2 MG/ML IJ SOLN
INTRAMUSCULAR | Status: DC | PRN
  Administered 2021-01-10 (×2): .1 mg via INTRAVENOUS

## 2021-01-10 MED ORDER — ACETAMINOPHEN 500 MG PO TABS
1000.0000 mg | ORAL_TABLET | Freq: Three times a day (TID) | ORAL | 0 refills | Status: AC
  Filled 2021-01-10: qty 84, 14d supply, fill #0

## 2021-01-10 MED ORDER — SODIUM CHLORIDE 0.9 % IR SOLN
Status: DC | PRN
  Administered 2021-01-10: 3000 mL

## 2021-01-10 MED ORDER — ASPIRIN 81 MG PO CHEW
81.0000 mg | CHEWABLE_TABLET | Freq: Two times a day (BID) | ORAL | 0 refills | Status: DC
  Filled 2021-01-10: qty 84, 42d supply, fill #0

## 2021-01-10 MED ORDER — FENTANYL CITRATE (PF) 100 MCG/2ML IJ SOLN
INTRAMUSCULAR | Status: AC
  Filled 2021-01-10: qty 2

## 2021-01-10 MED ORDER — PROPOFOL 10 MG/ML IV BOLUS
INTRAVENOUS | Status: AC
  Filled 2021-01-10: qty 20

## 2021-01-10 MED ORDER — ONDANSETRON HCL 4 MG/2ML IJ SOLN
INTRAMUSCULAR | Status: AC
  Filled 2021-01-10: qty 2

## 2021-01-10 MED ORDER — BUPIVACAINE HCL (PF) 0.25 % IJ SOLN
INTRAMUSCULAR | Status: AC
  Filled 2021-01-10: qty 30

## 2021-01-10 MED ORDER — CEFAZOLIN SODIUM-DEXTROSE 2-4 GM/100ML-% IV SOLN
INTRAVENOUS | Status: AC
  Filled 2021-01-10: qty 100

## 2021-01-10 MED ORDER — DEXAMETHASONE SODIUM PHOSPHATE 10 MG/ML IJ SOLN
INTRAMUSCULAR | Status: DC | PRN
  Administered 2021-01-10: 6 mg via INTRAVENOUS

## 2021-01-10 MED ORDER — ONDANSETRON HCL 4 MG PO TABS
4.0000 mg | ORAL_TABLET | Freq: Three times a day (TID) | ORAL | 0 refills | Status: AC | PRN
  Filled 2021-01-10: qty 10, 4d supply, fill #0

## 2021-01-10 MED ORDER — LIDOCAINE HCL (CARDIAC) PF 100 MG/5ML IV SOSY
PREFILLED_SYRINGE | INTRAVENOUS | Status: DC | PRN
  Administered 2021-01-10: 100 mg via INTRAVENOUS

## 2021-01-10 MED ORDER — FENTANYL CITRATE (PF) 100 MCG/2ML IJ SOLN: 25.0000 ug | INTRAMUSCULAR | Status: DC | PRN

## 2021-01-10 MED ORDER — OXYCODONE HCL 5 MG PO TABS: ORAL_TABLET | ORAL | 0 refills | Status: AC

## 2021-01-10 MED ORDER — DICLOFENAC SODIUM 75 MG PO TBEC
75.0000 mg | DELAYED_RELEASE_TABLET | Freq: Two times a day (BID) | ORAL | 0 refills | Status: DC
  Filled 2021-01-10: qty 60, 30d supply, fill #0

## 2021-01-10 MED ORDER — OXYCODONE HCL 5 MG PO TABS: 5.0000 mg | ORAL_TABLET | Freq: Once | ORAL | Status: DC | PRN

## 2021-01-10 MED ORDER — MIDAZOLAM HCL 5 MG/5ML IJ SOLN
INTRAMUSCULAR | Status: DC | PRN
  Administered 2021-01-10: 2 mg via INTRAVENOUS

## 2021-01-10 MED ORDER — BUPIVACAINE HCL (PF) 0.25 % IJ SOLN
INTRAMUSCULAR | Status: DC | PRN
  Administered 2021-01-10: 20 mL

## 2021-01-10 MED ORDER — ONDANSETRON HCL 4 MG/2ML IJ SOLN: 4.0000 mg | Freq: Once | INTRAMUSCULAR | Status: DC | PRN

## 2021-01-10 MED ORDER — OXYCODONE HCL 5 MG/5ML PO SOLN: 5.0000 mg | Freq: Once | ORAL | Status: DC | PRN

## 2021-01-10 MED ORDER — ONDANSETRON HCL 4 MG/2ML IJ SOLN
INTRAMUSCULAR | Status: DC | PRN
  Administered 2021-01-10: 4 mg via INTRAVENOUS

## 2021-01-10 MED ORDER — GLYCOPYRROLATE PF 0.2 MG/ML IJ SOSY
PREFILLED_SYRINGE | INTRAMUSCULAR | Status: AC
  Filled 2021-01-10: qty 1

## 2021-01-10 MED ORDER — CEFAZOLIN SODIUM-DEXTROSE 2-4 GM/100ML-% IV SOLN
2.0000 g | INTRAVENOUS | Status: AC
Start: 2021-01-10 — End: 2021-01-10
  Administered 2021-01-10: 2 g via INTRAVENOUS

## 2021-01-10 MED ORDER — FENTANYL CITRATE (PF) 100 MCG/2ML IJ SOLN
INTRAMUSCULAR | Status: DC | PRN
  Administered 2021-01-10 (×3): 25 ug via INTRAVENOUS
  Administered 2021-01-10: 50 ug via INTRAVENOUS

## 2021-01-10 MED ORDER — LACTATED RINGERS IV SOLN: INTRAVENOUS | Status: DC

## 2021-01-10 MED ORDER — KETOROLAC TROMETHAMINE 30 MG/ML IJ SOLN
INTRAMUSCULAR | Status: AC
  Filled 2021-01-10: qty 1

## 2021-01-10 MED ORDER — OXYCODONE HCL 5 MG PO TABS
ORAL_TABLET | ORAL | 0 refills | Status: DC
  Filled 2021-01-10: qty 16, 5d supply, fill #0

## 2021-01-10 SURGICAL SUPPLY — 39 items
APL PRP STRL LF DISP 70% ISPRP (MISCELLANEOUS) ×1
BANDAGE ESMARK 6X9 LF (GAUZE/BANDAGES/DRESSINGS) IMPLANT
BLADE CLIPPER SURG (BLADE) ×2 IMPLANT
BNDG CMPR 9X6 STRL LF SNTH (GAUZE/BANDAGES/DRESSINGS)
BNDG ELASTIC 6X5.8 VLCR STR LF (GAUZE/BANDAGES/DRESSINGS) ×2 IMPLANT
BNDG ESMARK 6X9 LF (GAUZE/BANDAGES/DRESSINGS)
CHLORAPREP W/TINT 26 (MISCELLANEOUS) ×2 IMPLANT
CLSR STERI-STRIP ANTIMIC 1/2X4 (GAUZE/BANDAGES/DRESSINGS) ×2 IMPLANT
CUFF TOURN SGL QUICK 34 (TOURNIQUET CUFF) ×2
CUFF TRNQT CYL 34X4.125X (TOURNIQUET CUFF) ×1 IMPLANT
DISSECTOR 3.5MM X 13CM CVD (MISCELLANEOUS) IMPLANT
DISSECTOR 4.0MMX13CM CVD (MISCELLANEOUS) ×2 IMPLANT
DRAPE ARTHROSCOPY W/POUCH 90 (DRAPES) ×2 IMPLANT
DRAPE IMP U-DRAPE 54X76 (DRAPES) ×2 IMPLANT
DRAPE U-SHAPE 47X51 STRL (DRAPES) ×2 IMPLANT
GAUZE SPONGE 4X4 12PLY STRL (GAUZE/BANDAGES/DRESSINGS) ×2 IMPLANT
GLOVE SRG 8 PF TXTR STRL LF DI (GLOVE) ×1 IMPLANT
GLOVE SURG ENC MOIS LTX SZ6.5 (GLOVE) ×6 IMPLANT
GLOVE SURG LTX SZ8 (GLOVE) ×4 IMPLANT
GLOVE SURG UNDER POLY LF SZ6.5 (GLOVE) ×2 IMPLANT
GLOVE SURG UNDER POLY LF SZ7 (GLOVE) ×4 IMPLANT
GLOVE SURG UNDER POLY LF SZ8 (GLOVE) ×2
GOWN STRL REUS W/ TWL LRG LVL3 (GOWN DISPOSABLE) ×2 IMPLANT
GOWN STRL REUS W/TWL LRG LVL3 (GOWN DISPOSABLE) ×4
GOWN STRL REUS W/TWL XL LVL3 (GOWN DISPOSABLE) ×2 IMPLANT
KIT TURNOVER KIT B (KITS) ×2 IMPLANT
MANIFOLD NEPTUNE II (INSTRUMENTS) ×2 IMPLANT
NDL SAFETY ECLIPSE 18X1.5 (NEEDLE) ×1 IMPLANT
NEEDLE HYPO 18GX1.5 SHARP (NEEDLE) ×2
NS IRRIG 1000ML POUR BTL (IV SOLUTION) IMPLANT
PACK ARTHROSCOPY DSU (CUSTOM PROCEDURE TRAY) ×2 IMPLANT
PORT APPOLLO RF 90DEGREE MULTI (SURGICAL WAND) ×2 IMPLANT
SLEEVE SCD COMPRESS KNEE MED (STOCKING) ×2 IMPLANT
SUT MNCRL AB 4-0 PS2 18 (SUTURE) ×2 IMPLANT
SYR 5ML LUER SLIP (SYRINGE) ×2 IMPLANT
TOWEL GREEN STERILE FF (TOWEL DISPOSABLE) ×2 IMPLANT
TUBE CONNECTING 20X1/4 (TUBING) ×2 IMPLANT
TUBING ARTHROSCOPY IRRIG 16FT (MISCELLANEOUS) ×2 IMPLANT
WATER STERILE IRR 1000ML POUR (IV SOLUTION) ×2 IMPLANT

## 2021-01-10 NOTE — Anesthesia Procedure Notes (Signed)
Procedure Name: LMA Insertion Performed by: Suzanne Kho S, CRNA Pre-anesthesia Checklist: Patient identified, Emergency Drugs available, Suction available and Patient being monitored Patient Re-evaluated:Patient Re-evaluated prior to induction Oxygen Delivery Method: Circle System Utilized Preoxygenation: Pre-oxygenation with 100% oxygen Induction Type: IV induction Ventilation: Mask ventilation without difficulty LMA: LMA inserted LMA Size: 4.0 Number of attempts: 1 Airway Equipment and Method: Bite block Placement Confirmation: positive ETCO2 Tube secured with: Tape Dental Injury: Teeth and Oropharynx as per pre-operative assessment        

## 2021-01-10 NOTE — Transfer of Care (Signed)
Immediate Anesthesia Transfer of Care Note  Patient: Gary Ashley  Procedure(s) Performed: RIGHT KNEE ARTHROSCOPY WITH LATERAL MENISECTOMY DEBRIDEMENT CHONDROPLASTY  LYSIS OF ADHESIONS WITH MANIPULATION (Right Knee)  Patient Location: PACU  Anesthesia Type:General  Level of Consciousness: drowsy  Airway & Oxygen Therapy: Patient Spontanous Breathing and Patient connected to face mask oxygen  Post-op Assessment: Report given to RN and Post -op Vital signs reviewed and stable  Post vital signs: Reviewed and stable  Last Vitals:  Vitals Value Taken Time  BP 137/100 01/10/21 1351  Temp    Pulse 55 01/10/21 1352  Resp 13 01/10/21 1352  SpO2 100 % 01/10/21 1352  Vitals shown include unvalidated device data.  Last Pain:  Vitals:   01/10/21 1000  TempSrc: Oral  PainSc: 5          Complications: No complications documented.

## 2021-01-10 NOTE — Op Note (Signed)
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  We were able to perform a lysis of adhesions and manipulation achieve a 2 to 3 degree flexion contracture remnant and 135 degrees of flexion.  His motion was relatively good once asleep and may have been inhibited by pain.  Due to his overgrowth in the notch we were unable to really scope to the back of the knee as it would have required taking down his cruciates in order to get an appropriate view. 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  The lateral plateau actually look quite good and there was a articular diastases that involve the tibial spine laterally but the joint itself was preserved.  There was a large osteochondral loose body 1 x 1.2 cm in the anterior portion of the knee with some loose soft tissue attachment keeping it from floating completely throughout the joint.  This was removed en bloc.  Overall the lateral medial joint lines looked relatively good considering the significant injury. Intercondylar Notch: ACL and PCL are intact.  Successful completion of the planned procedure.  [name] motion was overall quite good.  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  We feel that therapy will help and the  patient is nowhere near arthroplasty at this point.  Post-operative plan: The patient will be weightbearing to tolerance with early therapy.  The patient will be discharged home.  DVT prophylaxis Aspirin 81 mg twice daily for 6 weeks.  Pain control with PRN pain medication preferring oral medicines.  Follow up plan will be scheduled in approximately 7 days for incision check.  Post-Op Diagnosis: Same Surgeons:Primary: Bjorn Pippin, MD Assistants:Caroline McBane PA-C Location: [facility] OR ROOM 6 Anesthesia: General with local anesthetic Antibiotics: Ancef 2 g Tourniquet time:  Total Tourniquet Time Documented: Thigh (Right) - 19 minutes Total: Thigh (Right) - 19 minutes  Estimated Blood Loss: Minimal Complications: None Specimens: None Implants: * No implants in log *  Indications for Surgery:   Gary Ashley is a 40 y.o. male with previous proximal tibial fracture fixed by one of my colleagues with subsequent arthrofibrosis and possible meniscus tear versus loose body.  There are failed nonoperative measures.  Benefits and risks of operative and nonoperative management were discussed prior to surgery with patient/guardian(s) and informed consent form was completed.  Specific risks including infection, need for additional surgery, continued pain, need for further surgery amongst others.   Procedure:   The patient was identified properly. Informed consent was obtained and the surgical site was marked. The patient was taken up to suite where general anesthesia was induced. The patient was placed in the supine position with a post against the surgical leg and a nonsterile tourniquet applied. The surgical leg was then prepped and draped usual sterile fashion.  A standard surgical timeout was performed.  2 standard anterior portals were made and diagnostic arthroscopy performed. Please note the findings as noted above.  We cleared the joint as above and perform gentle chondroplasty of  patellofemoral and lateral side of the joints.  Loose body was removed from the anterior lateral gutter.  Lyse adhesions perform an RF ablator and a shaver along the anterior interval around the patellar tendon as well as the suprapatellar pouch.  Gentle manipulation anesthesia was performed.  Incisions closed with absorbable suture. The patient was awoken from general anesthesia and taken to the PACU in stable condition without complication.   Alfonse Alpers, PA-C, present and scrubbed throughout the case, critical for completion in a timely fashion, and for retraction, instrumentation, closure.

## 2021-01-10 NOTE — Interval H&P Note (Signed)
History and Physical Interval Note:  01/10/2021 11:24 AM  Gary Ashley  has presented today for surgery, with the diagnosis of RIGHT KNEE CHONDROMALCIAL  MENISCUS TEAR.  The various methods of treatment have been discussed with the patient and family. After consideration of risks, benefits and other options for treatment, the patient has consented to  Procedure(s): RIGHT KNEE ARTHROSCOPY WITH LATERAL MENISECTOMY DEBRIDEMENT CHONDROPLASTY  LYSIS OF ADHESIONS WITH MANIPULATION (Right) as a surgical intervention.  The patient's history has been reviewed, patient examined, no change in status, stable for surgery.  I have reviewed the patient's chart and labs.  Questions were answered to the patient's satisfaction.     Bjorn Pippin
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
## 2021-01-10 NOTE — Discharge Instructions (Signed)
Ramond Marrow MD, MPH Alfonse Alpers, PA-C [facility] [address], [address] [phone] (tel)   [phone] (fax)   POST-OPERATIVE INSTRUCTIONS - Knee Arthroscopy  WOUND CARE - You may remove the Operative Dressing on Post-Op Day #3 (72hrs after surgery).   -  Alternatively if you would like you can leave dressing on until follow-up if within 7-8 days but keep it dry. - Leave steri-strips in place until they fall off on their own, usually 2 weeks postop. - An ACE wrap may be used to control swelling, do not wrap this too tight.  If the initial ACE wrap feels too tight you may loosen it. - There may be a small amount of fluid/bleeding leaking at the surgical site.  - This is normal; the knee is filled with fluid during the procedure and can leak for 24-48hrs after surgery. You may change/reinforce the bandage as needed.  - Use the Cryocuff or Ice as often as possible for the first 7 days, then as needed for pain relief. Always keep a towel, ACE wrap or other barrier between the cooling unit and your skin.  - You may shower on Post-Op Day #3. Gently pat the area dry. Do not soak the knee in water or submerge it.  - Do not go swimming in the pool or ocean until 4 weeks after surgery or when otherwise instructed.  Keep incisions as dry as possible.   BRACE/AMBULATION -            Unless otherwise specified, you will not need a brace after this procedure.   - You can use crutches initially to help you ambulate, but this is not required -  You can put full weight on your operative leg as your feel comfortable  REGIONAL ANESTHESIA (NERVE BLOCKS) - The anesthesia team may have performed a nerve block for you if safe in the setting of your care.  This is a great tool used to minimize pain.  Typically the block may start wearing off overnight.  This can be a challenging period but please utilize your as needed pain medications to try and manage this period and know it will  be a brief transition as the nerve block wears completely   POST-OP MEDICATIONS - Multimodal approach to pain control - In general your pain will be controlled with a combination of substances.  Prescriptions unless otherwise discussed are electronically sent to your pharmacy.  This is a carefully made plan we use to minimize narcotic use.     - Diclofenac - Anti-inflammatory medication taken on a scheduled basis   - Take 1 tablet twice a day with food - Acetaminophen - Non-narcotic pain medicine taken on a scheduled basis    - Take two 500 mg tablets (1,000 mg total) every 8 hours - Oxycodone - This is a strong narcotic, to be used only on an "as needed" basis for severe pain. - Aspirin 81mg  - This medicine is used to minimize the risk of blood clots after surgery.   - Take 1 tablet twice a day for 6 weeks -  Zofran - take as needed for nausea   FOLLOW-UP   Please call the office to schedule a follow-up appointment for your incision check, 7-10 days post-operatively.   IF YOU HAVE ANY QUESTIONS, PLEASE FEEL FREE TO CALL OUR OFFICE.   HELPFUL INFORMATION  - If you had a block, it will wear off between 8-24 hrs postop typically.  This is period  when your pain may go from nearly zero to the pain you would have had post-op without the block.  This is an abrupt transition but nothing dangerous is happening.  You may take an extra dose of narcotic when this happens.   Keep your leg elevated to decrease swelling, which will then in turn decrease your pain. I would elevate the foot of your bed by putting a couple of couch pillows between your mattress and box spring. I would not keep pillow directly under your ankle.  - Do not sleep with a pillow behind your knee even if it is more comfortable as this may make it harder to get your knee fully straight long term.   There will be MORE swelling on days 1-3 than there is on the day of surgery.  This also is normal. The swelling will decrease  with the anti-inflammatory medication, ice and keeping it elevated. The swelling will make it more difficult to bend your knee. As the swelling goes down your motion will become easier   You may develop swelling and bruising that extends from your knee down to your calf and perhaps even to your foot over the next week. Do not be alarmed. This too is normal, and it is due to gravity   There may be some numbness adjacent to the incision site. This may last for 6-12 months or longer in some patients and is expected.   You may return to sedentary work/school in the next couple of days when you feel up to it. You will need to keep your leg elevated as much as possible    You should wean off your narcotic medicines as soon as you are able.  Most patients will be off or using minimal narcotics before their first postop appointment.    We suggest you use the pain medication the first night prior to going to bed, in order to ease any pain when the anesthesia wears off. You should avoid taking pain medications on an empty stomach as it will make you nauseous.   Do not drink alcoholic beverages or take illicit drugs when taking pain medications.   It is against the law to drive while taking narcotics. You cannot drive if your Right leg is in brace locked in extension.   Pain medication may make you constipated.  Below are a few solutions to try in this order:  o Decrease the amount of pain medication if you aren't having pain.  o Drink lots of decaffeinated fluids.  o Drink prune juice and/or eat dried prunes   o If the first 3 don't work start with additional solutions  o Take Colace - an over-the-counter stool softener  o Take Senokot - an over-the-counter laxative  o Take Miralax - a stronger over-the-counter laxative    For more information including helpful videos and documents visit our website:   [email or web address]    Post Anesthesia Home Care  Instructions  Activity: Get plenty of rest for the remainder of the day. A responsible individual must stay with you for 24 hours following the procedure.  For the next 24 hours, DO NOT: -Drive a car -Advertising copywriter -Drink alcoholic beverages -Take any medication unless instructed by your physician -Make any legal decisions or sign important papers.  Meals: Start with liquid foods such as gelatin or soup. Progress to regular foods as tolerated. Avoid greasy, spicy, heavy foods. If nausea and/or vomiting occur, drink only clear liquids until the nausea and/or vomiting subsides.  Call your physician if vomiting continues.  Special Instructions/Symptoms: Your throat may feel dry or sore from the anesthesia or the breathing tube placed in your throat during surgery. If this causes discomfort, gargle with warm salt water. The discomfort should disappear within 24 hours.  If you had a scopolamine patch placed behind your ear for the management of post- operative nausea and/or vomiting:  1. The medication in the patch is effective for 72 hours, after which it should be removed.  Wrap patch in a tissue and discard in the trash. Wash hands thoroughly with soap and water. 2. You may remove the patch earlier than 72 hours if you experience unpleasant side effects which may include dry mouth, dizziness or visual disturbances. 3. Avoid touching the patch. Wash your hands with soap and water after contact with the patch.    Next dose of Ibuprofen due at 7:30pm.

## 2021-01-11 ENCOUNTER — Other Ambulatory Visit: Payer: Self-pay

## 2021-01-11 NOTE — Anesthesia Postprocedure Evaluation (Signed)
Anesthesia Post Note  Patient: KAYLOR SIMENSON  Procedure(s) Performed: RIGHT KNEE ARTHROSCOPY WITH LATERAL MENISECTOMY DEBRIDEMENT CHONDROPLASTY  LYSIS OF ADHESIONS WITH MANIPULATION (Right Knee)     Patient location during evaluation: PACU Anesthesia Type: General Level of consciousness: awake and alert Pain management: pain level controlled Vital Signs Assessment: post-procedure vital signs reviewed and stable Respiratory status: spontaneous breathing, nonlabored ventilation and respiratory function stable Cardiovascular status: blood pressure returned to baseline and stable Postop Assessment: no apparent nausea or vomiting Anesthetic complications: no   No complications documented.  Last Vitals:  Vitals:   01/10/21 1423 01/10/21 1449  BP: (!) 165/96 (!) 150/94  Pulse: 67 69  Resp: 14 16  Temp:  36.4 C  SpO2: 100% 98%    Last Pain:  Vitals:   01/10/21 1446  TempSrc:   PainSc: 0-No pain                 Lucretia Kern

## 2021-01-14 ENCOUNTER — Encounter (HOSPITAL_BASED_OUTPATIENT_CLINIC_OR_DEPARTMENT_OTHER): Payer: Self-pay | Admitting: Orthopaedic Surgery

## 2021-01-14 ENCOUNTER — Ambulatory Visit: Payer: Medicaid Other | Admitting: Physical Therapy

## 2021-01-16 ENCOUNTER — Ambulatory Visit: Payer: Medicaid Other | Attending: Nurse Practitioner

## 2021-01-16 ENCOUNTER — Other Ambulatory Visit: Payer: Self-pay

## 2021-01-16 DIAGNOSIS — G8929 Other chronic pain: Secondary | ICD-10-CM | POA: Diagnosis present

## 2021-01-16 DIAGNOSIS — M25661 Stiffness of right knee, not elsewhere classified: Secondary | ICD-10-CM

## 2021-01-16 DIAGNOSIS — M79604 Pain in right leg: Secondary | ICD-10-CM | POA: Diagnosis present

## 2021-01-16 DIAGNOSIS — Z9889 Other specified postprocedural states: Secondary | ICD-10-CM | POA: Diagnosis present

## 2021-01-16 DIAGNOSIS — M25561 Pain in right knee: Secondary | ICD-10-CM | POA: Diagnosis not present

## 2021-01-16 DIAGNOSIS — M6281 Muscle weakness (generalized): Secondary | ICD-10-CM | POA: Diagnosis present
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
## 2021-01-18 ENCOUNTER — Other Ambulatory Visit: Payer: Self-pay

## 2021-01-18 DIAGNOSIS — M2341 Loose body in knee, right knee: Secondary | ICD-10-CM | POA: Diagnosis not present

## 2021-01-22 ENCOUNTER — Ambulatory Visit: Payer: Medicaid Other | Admitting: Physical Therapy

## 2021-01-22 ENCOUNTER — Other Ambulatory Visit: Payer: Self-pay

## 2021-01-22 DIAGNOSIS — Z9889 Other specified postprocedural states: Secondary | ICD-10-CM

## 2021-01-22 DIAGNOSIS — M79604 Pain in right leg: Secondary | ICD-10-CM

## 2021-01-22 DIAGNOSIS — G8929 Other chronic pain: Secondary | ICD-10-CM

## 2021-01-22 DIAGNOSIS — M25561 Pain in right knee: Secondary | ICD-10-CM | POA: Diagnosis not present

## 2021-01-22 DIAGNOSIS — M6281 Muscle weakness (generalized): Secondary | ICD-10-CM

## 2021-01-22 DIAGNOSIS — M25661 Stiffness of right knee, not elsewhere classified: Secondary | ICD-10-CM
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
## 2021-01-29 ENCOUNTER — Other Ambulatory Visit: Payer: Self-pay

## 2021-01-29 ENCOUNTER — Ambulatory Visit: Payer: Medicaid Other | Admitting: Physical Therapy

## 2021-01-29 ENCOUNTER — Telehealth: Payer: Self-pay | Admitting: *Deleted

## 2021-01-29 DIAGNOSIS — M6281 Muscle weakness (generalized): Secondary | ICD-10-CM

## 2021-01-29 DIAGNOSIS — G8929 Other chronic pain: Secondary | ICD-10-CM

## 2021-01-29 DIAGNOSIS — Z9889 Other specified postprocedural states: Secondary | ICD-10-CM

## 2021-01-29 DIAGNOSIS — M79604 Pain in right leg: Secondary | ICD-10-CM

## 2021-01-29 DIAGNOSIS — M25561 Pain in right knee: Secondary | ICD-10-CM | POA: Diagnosis not present

## 2021-01-29 DIAGNOSIS — M25661 Stiffness of right knee, not elsewhere classified: Secondary | ICD-10-CM

## 2021-01-29 NOTE — Telephone Encounter (Signed)
Gary Ashley called and is having problems getting his medication seroquel. He says he has called multiple times to the pharmacy and no one is answering.[facility]). He is needing his seroquel, and by our records he has refills.  I reached out to the pharmacy 3x and no one picked up the phone.  I finally called the front desk and Tiffanny was kind enough to help get me through to a pharmacist.  He does have medication and they will "try and get it ready for him". I have notified Gary Bradish.
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
## 2021-01-31 ENCOUNTER — Other Ambulatory Visit: Payer: Self-pay

## 2021-01-31 ENCOUNTER — Ambulatory Visit: Payer: Medicaid Other | Admitting: Physical Therapy

## 2021-01-31 DIAGNOSIS — M25561 Pain in right knee: Secondary | ICD-10-CM | POA: Diagnosis not present

## 2021-01-31 DIAGNOSIS — M6281 Muscle weakness (generalized): Secondary | ICD-10-CM

## 2021-01-31 DIAGNOSIS — M79604 Pain in right leg: Secondary | ICD-10-CM

## 2021-01-31 DIAGNOSIS — M25661 Stiffness of right knee, not elsewhere classified: Secondary | ICD-10-CM

## 2021-01-31 DIAGNOSIS — Z9889 Other specified postprocedural states: Secondary | ICD-10-CM

## 2021-01-31 DIAGNOSIS — G8929 Other chronic pain: Secondary | ICD-10-CM
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
## 2021-02-01 ENCOUNTER — Telehealth: Payer: Self-pay

## 2021-02-01 NOTE — Telephone Encounter (Signed)
FYI: Per Cristal Deer L. Prest you may receive a letter from a  [employer]. The letter will state Mr. Gary Ashley is Mr. Gary Ashley primary caregiver.   Mr. Gary Ashley would like you to ignore the letter. Patient stated can handle his own business and money. And has not given Mr. Gary Ashley any permissions.

## 2021-02-05 ENCOUNTER — Ambulatory Visit: Payer: Medicaid Other | Admitting: Physical Therapy

## 2021-02-05 ENCOUNTER — Other Ambulatory Visit: Payer: Self-pay

## 2021-02-05 DIAGNOSIS — G8929 Other chronic pain: Secondary | ICD-10-CM

## 2021-02-05 DIAGNOSIS — M6281 Muscle weakness (generalized): Secondary | ICD-10-CM

## 2021-02-05 DIAGNOSIS — Z9889 Other specified postprocedural states: Secondary | ICD-10-CM

## 2021-02-05 DIAGNOSIS — M25561 Pain in right knee: Secondary | ICD-10-CM | POA: Diagnosis not present

## 2021-02-05 DIAGNOSIS — M25661 Stiffness of right knee, not elsewhere classified: Secondary | ICD-10-CM

## 2021-02-05 DIAGNOSIS — M79604 Pain in right leg: Secondary | ICD-10-CM
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
## 2021-02-07 ENCOUNTER — Other Ambulatory Visit: Payer: Self-pay

## 2021-02-07 ENCOUNTER — Encounter: Payer: Self-pay | Admitting: Physical Therapy

## 2021-02-07 ENCOUNTER — Ambulatory Visit: Payer: Medicaid Other | Admitting: Physical Therapy

## 2021-02-07 DIAGNOSIS — M6281 Muscle weakness (generalized): Secondary | ICD-10-CM

## 2021-02-07 DIAGNOSIS — Z9889 Other specified postprocedural states: Secondary | ICD-10-CM

## 2021-02-07 DIAGNOSIS — M25561 Pain in right knee: Secondary | ICD-10-CM

## 2021-02-07 DIAGNOSIS — G8929 Other chronic pain: Secondary | ICD-10-CM

## 2021-02-07 DIAGNOSIS — M25661 Stiffness of right knee, not elsewhere classified: Secondary | ICD-10-CM

## 2021-02-07 DIAGNOSIS — M79604 Pain in right leg: Secondary | ICD-10-CM
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
## 2021-02-12 ENCOUNTER — Ambulatory Visit: Payer: Medicaid Other | Admitting: Physical Therapy

## 2021-02-14 ENCOUNTER — Ambulatory Visit: Payer: Medicaid Other | Admitting: Physical Therapy

## 2021-02-19 ENCOUNTER — Ambulatory Visit: Payer: Medicaid Other | Attending: Orthopaedic Surgery | Admitting: Physical Therapy

## 2021-02-19 ENCOUNTER — Other Ambulatory Visit: Payer: Self-pay

## 2021-02-19 DIAGNOSIS — M25561 Pain in right knee: Secondary | ICD-10-CM | POA: Insufficient documentation

## 2021-02-19 DIAGNOSIS — Z9889 Other specified postprocedural states: Secondary | ICD-10-CM

## 2021-02-19 DIAGNOSIS — M25661 Stiffness of right knee, not elsewhere classified: Secondary | ICD-10-CM | POA: Insufficient documentation

## 2021-02-19 DIAGNOSIS — M79604 Pain in right leg: Secondary | ICD-10-CM | POA: Insufficient documentation

## 2021-02-19 DIAGNOSIS — M6281 Muscle weakness (generalized): Secondary | ICD-10-CM | POA: Insufficient documentation

## 2021-02-19 DIAGNOSIS — G8929 Other chronic pain: Secondary | ICD-10-CM | POA: Insufficient documentation
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
## 2021-02-27 ENCOUNTER — Encounter: Payer: Self-pay | Admitting: Physical Medicine & Rehabilitation

## 2021-02-27 ENCOUNTER — Encounter: Payer: Medicaid Other | Attending: Registered Nurse | Admitting: Physical Medicine & Rehabilitation

## 2021-02-27 ENCOUNTER — Other Ambulatory Visit: Payer: Self-pay

## 2021-02-27 DIAGNOSIS — R4689 Other symptoms and signs involving appearance and behavior: Secondary | ICD-10-CM | POA: Diagnosis not present

## 2021-02-27 DIAGNOSIS — S069X0S Unspecified intracranial injury without loss of consciousness, sequela: Secondary | ICD-10-CM | POA: Diagnosis not present

## 2021-02-27 DIAGNOSIS — F5102 Adjustment insomnia: Secondary | ICD-10-CM | POA: Diagnosis not present

## 2021-02-27 MED ORDER — QUETIAPINE FUMARATE 50 MG PO TABS
50.0000 mg | ORAL_TABLET | ORAL | 3 refills | Status: DC
  Filled 2021-02-27 – 2021-04-09 (×3): qty 120, 30d supply, fill #0

## 2021-02-27 NOTE — Patient Instructions (Signed)
PLEASE FEEL FREE TO CALL OUR OFFICE WITH ANY PROBLEMS OR QUESTIONS ([phone])      
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
## 2021-02-28 ENCOUNTER — Ambulatory Visit: Payer: Medicaid Other

## 2021-03-05 ENCOUNTER — Ambulatory Visit: Payer: Medicaid Other

## 2021-03-06 ENCOUNTER — Other Ambulatory Visit: Payer: Self-pay

## 2021-03-07 ENCOUNTER — Other Ambulatory Visit: Payer: Self-pay

## 2021-03-10 ENCOUNTER — Telehealth: Payer: Self-pay

## 2021-03-10 NOTE — Telephone Encounter (Signed)
Reminded pt of his next appt on 03/12/21.

## 2021-03-12 ENCOUNTER — Telehealth: Payer: Self-pay | Admitting: Physical Medicine & Rehabilitation

## 2021-03-12 ENCOUNTER — Ambulatory Visit: Payer: Medicaid Other

## 2021-03-12 ENCOUNTER — Other Ambulatory Visit: Payer: Self-pay

## 2021-03-12 DIAGNOSIS — M6281 Muscle weakness (generalized): Secondary | ICD-10-CM | POA: Diagnosis not present

## 2021-03-12 DIAGNOSIS — M25661 Stiffness of right knee, not elsewhere classified: Secondary | ICD-10-CM

## 2021-03-12 DIAGNOSIS — M79604 Pain in right leg: Secondary | ICD-10-CM | POA: Diagnosis not present

## 2021-03-12 DIAGNOSIS — Z9889 Other specified postprocedural states: Secondary | ICD-10-CM

## 2021-03-12 DIAGNOSIS — M25561 Pain in right knee: Secondary | ICD-10-CM

## 2021-03-12 DIAGNOSIS — G8929 Other chronic pain: Secondary | ICD-10-CM
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
## 2021-03-26 ENCOUNTER — Other Ambulatory Visit: Payer: Self-pay

## 2021-03-26 DIAGNOSIS — M2341 Loose body in knee, right knee: Secondary | ICD-10-CM | POA: Diagnosis not present

## 2021-03-26 MED ORDER — DICLOFENAC SODIUM 75 MG PO TBEC
75.0000 mg | DELAYED_RELEASE_TABLET | Freq: Two times a day (BID) | ORAL | 0 refills | Status: DC | PRN
  Filled 2021-03-26 – 2021-04-08 (×3): qty 60, 30d supply, fill #0

## 2021-03-28 ENCOUNTER — Ambulatory Visit: Payer: Medicaid Other

## 2021-03-28 NOTE — Telephone Encounter (Signed)
LVM re: no show appt. Advised re: having no additional appts scheduled and  with 2 no show appts, he may only be scheduled 1 at a time. Advised pt will be Dced he no additional appts are made in the next month.

## 2021-04-02 ENCOUNTER — Other Ambulatory Visit: Payer: Self-pay

## 2021-04-02 ENCOUNTER — Ambulatory Visit: Payer: Medicaid Other | Attending: Orthopaedic Surgery

## 2021-04-02 DIAGNOSIS — M25661 Stiffness of right knee, not elsewhere classified: Secondary | ICD-10-CM | POA: Diagnosis present

## 2021-04-02 DIAGNOSIS — Z9889 Other specified postprocedural states: Secondary | ICD-10-CM | POA: Insufficient documentation

## 2021-04-02 DIAGNOSIS — M25561 Pain in right knee: Secondary | ICD-10-CM | POA: Diagnosis not present

## 2021-04-02 DIAGNOSIS — M6281 Muscle weakness (generalized): Secondary | ICD-10-CM | POA: Diagnosis present

## 2021-04-02 DIAGNOSIS — G8929 Other chronic pain: Secondary | ICD-10-CM | POA: Diagnosis present
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
## 2021-04-05 ENCOUNTER — Telehealth: Payer: Self-pay

## 2021-04-05 NOTE — Telephone Encounter (Signed)
Done

## 2021-04-08 ENCOUNTER — Other Ambulatory Visit: Payer: Self-pay

## 2021-04-08 MED ORDER — MELOXICAM 7.5 MG PO TABS
7.5000 mg | ORAL_TABLET | Freq: Two times a day (BID) | ORAL | 1 refills | Status: DC | PRN
  Filled 2021-04-08: qty 60, 30d supply, fill #0

## 2021-04-09 ENCOUNTER — Other Ambulatory Visit: Payer: Self-pay

## 2021-04-09 ENCOUNTER — Ambulatory Visit: Payer: Medicaid Other

## 2021-04-09 DIAGNOSIS — M6281 Muscle weakness (generalized): Secondary | ICD-10-CM

## 2021-04-09 DIAGNOSIS — G8929 Other chronic pain: Secondary | ICD-10-CM

## 2021-04-09 DIAGNOSIS — Z9889 Other specified postprocedural states: Secondary | ICD-10-CM

## 2021-04-09 DIAGNOSIS — M25661 Stiffness of right knee, not elsewhere classified: Secondary | ICD-10-CM
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
## 2021-04-10 ENCOUNTER — Other Ambulatory Visit: Payer: Self-pay

## 2021-04-10 ENCOUNTER — Telehealth: Payer: Self-pay

## 2021-04-10 MED ORDER — QUETIAPINE FUMARATE ER 200 MG PO TB24
200.0000 mg | ORAL_TABLET | Freq: Every day | ORAL | 4 refills | Status: DC
  Filled 2021-04-10: qty 30, 30d supply, fill #0

## 2021-04-10 NOTE — Telephone Encounter (Signed)
We'll try the 200mg  ER in the evening. thx

## 2021-04-10 NOTE — Telephone Encounter (Signed)
[facility] called: Gary Ashley. [employer] will not cover the Seroquel as written.    Per Pharmacy, is it possible to change the dosing to Seroquel 200 MG ER, or 50 MG  then 150 MG.   Please advise. Call back ph [phone].

## 2021-04-11 ENCOUNTER — Ambulatory Visit: Payer: Medicaid Other | Admitting: Registered Nurse

## 2021-04-11 ENCOUNTER — Other Ambulatory Visit: Payer: Self-pay

## 2021-04-12 ENCOUNTER — Ambulatory Visit (HOSPITAL_COMMUNITY): Payer: Medicaid Other

## 2021-04-12 ENCOUNTER — Other Ambulatory Visit (HOSPITAL_COMMUNITY): Payer: Self-pay | Admitting: Orthopaedic Surgery

## 2021-04-12 ENCOUNTER — Other Ambulatory Visit: Payer: Self-pay

## 2021-04-12 DIAGNOSIS — M7989 Other specified soft tissue disorders: Secondary | ICD-10-CM

## 2021-04-12 DIAGNOSIS — M2341 Loose body in knee, right knee: Secondary | ICD-10-CM | POA: Diagnosis not present

## 2021-04-12 DIAGNOSIS — M79604 Pain in right leg: Secondary | ICD-10-CM

## 2021-04-17 ENCOUNTER — Ambulatory Visit (HOSPITAL_COMMUNITY)
Admission: RE | Admit: 2021-04-17 | Discharge: 2021-04-17 | Disposition: A | Discharge status: Home / Self Care | Payer: Medicaid Other | Source: Ambulatory Visit | Attending: Orthopaedic Surgery | Admitting: Orthopaedic Surgery

## 2021-04-17 ENCOUNTER — Other Ambulatory Visit: Payer: Self-pay

## 2021-04-17 DIAGNOSIS — M79604 Pain in right leg: Secondary | ICD-10-CM | POA: Insufficient documentation

## 2021-04-17 DIAGNOSIS — M7989 Other specified soft tissue disorders: Secondary | ICD-10-CM

## 2021-04-22 ENCOUNTER — Encounter: Payer: Medicare Other | Admitting: Psychology

## 2021-04-22 ENCOUNTER — Other Ambulatory Visit: Payer: Self-pay

## 2021-04-30 ENCOUNTER — Ambulatory Visit: Payer: Medicare Other

## 2021-05-21 ENCOUNTER — Ambulatory Visit: Payer: Medicare Other | Attending: Orthopaedic Surgery

## 2021-05-21 ENCOUNTER — Other Ambulatory Visit: Payer: Self-pay

## 2021-05-21 DIAGNOSIS — Z8781 Personal history of (healed) traumatic fracture: Secondary | ICD-10-CM | POA: Diagnosis present

## 2021-05-21 DIAGNOSIS — M6281 Muscle weakness (generalized): Secondary | ICD-10-CM | POA: Diagnosis present

## 2021-05-21 DIAGNOSIS — Z9889 Other specified postprocedural states: Secondary | ICD-10-CM | POA: Insufficient documentation

## 2021-05-21 DIAGNOSIS — M79604 Pain in right leg: Secondary | ICD-10-CM | POA: Diagnosis present

## 2021-05-21 DIAGNOSIS — S82141A Displaced bicondylar fracture of right tibia, initial encounter for closed fracture: Secondary | ICD-10-CM | POA: Diagnosis present

## 2021-05-21 DIAGNOSIS — M25561 Pain in right knee: Secondary | ICD-10-CM | POA: Insufficient documentation

## 2021-05-21 DIAGNOSIS — M25661 Stiffness of right knee, not elsewhere classified: Secondary | ICD-10-CM | POA: Diagnosis present

## 2021-05-21 DIAGNOSIS — G8929 Other chronic pain: Secondary | ICD-10-CM | POA: Diagnosis present
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
## 2021-05-28 ENCOUNTER — Ambulatory Visit: Payer: Medicare Other

## 2021-05-28 ENCOUNTER — Encounter: Payer: Medicare Other | Attending: Registered Nurse | Admitting: Psychology

## 2021-05-28 DIAGNOSIS — R4689 Other symptoms and signs involving appearance and behavior: Secondary | ICD-10-CM | POA: Insufficient documentation

## 2021-05-28 DIAGNOSIS — S069X0S Unspecified intracranial injury without loss of consciousness, sequela: Secondary | ICD-10-CM | POA: Insufficient documentation

## 2021-05-28 DIAGNOSIS — S069X9S Unspecified intracranial injury with loss of consciousness of unspecified duration, sequela: Secondary | ICD-10-CM | POA: Insufficient documentation

## 2021-05-29 ENCOUNTER — Other Ambulatory Visit: Payer: Self-pay

## 2021-05-29 ENCOUNTER — Encounter (HOSPITAL_BASED_OUTPATIENT_CLINIC_OR_DEPARTMENT_OTHER): Payer: Medicare Other | Admitting: Physical Medicine & Rehabilitation

## 2021-05-29 ENCOUNTER — Encounter: Payer: Self-pay | Admitting: Physical Medicine & Rehabilitation

## 2021-05-29 VITALS — BP 122/86 | HR 66 | Temp 98.0°F | Ht 72.0 in | Wt 126.2 lb

## 2021-05-29 DIAGNOSIS — S069X0S Unspecified intracranial injury without loss of consciousness, sequela: Secondary | ICD-10-CM | POA: Diagnosis not present

## 2021-05-29 DIAGNOSIS — R4689 Other symptoms and signs involving appearance and behavior: Secondary | ICD-10-CM | POA: Diagnosis not present

## 2021-05-29 DIAGNOSIS — S069X9S Unspecified intracranial injury with loss of consciousness of unspecified duration, sequela: Secondary | ICD-10-CM

## 2021-05-29 MED ORDER — DIVALPROEX SODIUM 500 MG PO DR TAB
500.0000 mg | DELAYED_RELEASE_TABLET | Freq: Two times a day (BID) | ORAL | 3 refills | Status: DC
  Filled 2021-05-29 – 2021-06-07 (×2): qty 60, 30d supply, fill #0

## 2021-05-29 NOTE — Progress Notes (Signed)
Subjective:    Patient ID: Gary Ashley, male    DOB: [birth year], 40 y.o.   MRN: [id number]  HPI Gary Ashley is here in follow up of his polytrauma.. he reports worsening headaches over the last 2 months.  He thinks is related to weight loss.  He states that he is trying to eat but he still is losing weight.  Appetite is fair.  Sleep is fair.  He feels that he is doing better from an emotional standpoint with less anxiety and hallucinations.[name]  He remains on Seroquel 200 mg at bedtime along with nortriptyline 25 mg and gabapentin 3 mg 3 times daily.  He also takes Celexa 20 mg at bedtime.  Pain Inventory Average Pain 5 Pain Right Now 5 My pain is intermittent, constant, sharp, burning, stabbing, and aching  In the last 24 hours, has pain interfered with the following? General activity 10 Relation with others 8 Enjoyment of life 8 What TIME of day is your pain at its worst? evening and night Sleep (in general) Fair  Pain is worse with: walking, inactivity, standing, and some activites Pain improves with: heat/ice, therapy/exercise, medication, and injections Relief from Meds: 6  Family History  Problem Relation Age of Onset   Healthy Mother    Healthy Father    Social History   Socioeconomic History   Marital status: Single    Spouse name: Not on file   Number of children: Not on file   Years of education: Not on file   Highest education level: Not on file  Occupational History   Not on file  Tobacco Use   Smoking status: Some Days    Types: Cigarettes   Smokeless tobacco: Never  Vaping Use   Vaping Use: Never used  Substance and Sexual Activity   Alcohol use: Not Currently   Drug use: Yes    Types: Marijuana    Comment: sometimes   Sexual activity: Yes  Other Topics Concern   Not on file  Social History Narrative   Not on file   Social Determinants of Health   Financial Resource Strain: Not on file  Food Insecurity: Not on file  Transportation Needs: Not  on file  Physical Activity: Not on file  Stress: Not on file  Social Connections: Not on file   Past Surgical History:  Procedure Laterality Date   APPENDECTOMY     EXTERNAL FIXATION LEG Bilateral 02/18/2019   Procedure: EXTERNAL FIXATION RIGHT LEG, I&D LEFT LEG;  Surgeon: Roby Lofts, MD;  Location: [facility];  Service: Orthopedics;  Laterality: Bilateral;   KNEE ARTHROSCOPY WITH LATERAL MENISECTOMY Right 01/10/2021   Procedure: RIGHT KNEE ARTHROSCOPY WITH LATERAL MENISECTOMY DEBRIDEMENT CHONDROPLASTY  LYSIS OF ADHESIONS WITH MANIPULATION;  Surgeon: Bjorn Pippin, MD;  Location: [facility];  Service: Orthopedics;  Laterality: Right;   ORIF TIBIA PLATEAU Right 02/21/2019   Procedure: OPEN REDUCTION INTERNAL FIXATION (ORIF) TIBIAL PLATEAU;  Surgeon: Roby Lofts, MD;  Location: [facility];  Service: Orthopedics;  Laterality: Right;   Past Surgical History:  Procedure Laterality Date   APPENDECTOMY     EXTERNAL FIXATION LEG Bilateral 02/18/2019   Procedure: EXTERNAL FIXATION RIGHT LEG, I&D LEFT LEG;  Surgeon: Roby Lofts, MD;  Location: [facility];  Service: Orthopedics;  Laterality: Bilateral;   KNEE ARTHROSCOPY WITH LATERAL MENISECTOMY Right 01/10/2021   Procedure: RIGHT KNEE ARTHROSCOPY WITH LATERAL MENISECTOMY DEBRIDEMENT CHONDROPLASTY  LYSIS OF ADHESIONS WITH MANIPULATION;  Surgeon: Bjorn Pippin, MD;  Location: [facility];  Service: Orthopedics;  Laterality: Right;   ORIF TIBIA PLATEAU Right 02/21/2019   Procedure: OPEN REDUCTION INTERNAL FIXATION (ORIF) TIBIAL PLATEAU;  Surgeon: Roby Lofts, MD;  Location: [facility];  Service: Orthopedics;  Laterality: Right;   Past Medical History:  Diagnosis Date   Anxiety    Bilateral pneumothorax    C7 cervical fracture (HCC)    Depression    Hypertension    SAH (subarachnoid hemorrhage) (HCC)    Sinus tachycardia    TBI (traumatic brain injury) (HCC)    Vitamin D deficiency    BP 122/86   Pulse 66   Temp 98 F (36.7  C)   Ht 6' (1.829 m)   Wt 126 lb 3.2 oz (57.2 kg)   SpO2 97%   BMI 17.12 kg/m   Opioid Risk Score:   Fall Risk Score:  `1  Depression screen PHQ 2/9  Depression screen [facility] 2/9 05/02/2020 02/29/2020 10/05/2019 06/10/2019 03/16/2019 03/15/2019  Decreased Interest 0 3 3 2 3 3   Down, Depressed, Hopeless 0 3 3 2 3 3   PHQ - 2 Score 0 6 6 4 6 6   Altered sleeping - 3 2 3 3 3   Tired, decreased energy - 3 2 3  0 3  Change in appetite - 0 0 0 0 0  Feeling bad or failure about yourself  - 1 2 0 3 3  Trouble concentrating - 3 2 0 3 3  Moving slowly or fidgety/restless - 3 2 2  0 3  Suicidal thoughts - 3 1 2  0 0  PHQ-9 Score - 22 17 14 15 21   Difficult doing work/chores - - Very difficult - - -    Review of Systems  Constitutional:  Positive for unexpected weight change.  Gastrointestinal:  Positive for abdominal pain and diarrhea.  Musculoskeletal:        Pain on right side in the arm & leg  Neurological:  Positive for headaches.  Psychiatric/Behavioral:  Positive for hallucinations.        Talking to people and they are not there      Objective:   Physical Exam Constitutional: No distress . Vital signs reviewed. HEENT: NCAT, EOMI, oral membranes moist Neck: supple Cardiovascular: RRR without murmur. No JVD    Respiratory/Chest: CTA Bilaterally without wheezes or rales. Normal effort    GI/Abdomen: BS +, non-tender, non-distended Ext: no clubbing, cyanosis, or edema Psych: pleasant and cooperative today. No hallucinations, still a little impulsive Neuro:  attention and concentration/stm improved. No cn findings. Balance fair. Strength 4-5/5.  Musculoskeletal:right knee still tender with crepitus during PROM. still with antalgia RLE, ACE wrapped. Mild pain in both shoulders            Assessment & Plan:  1. Multiple trauma with TBI             -remains  non-vocational although I think he has potential at some point for work.  2. Closed Bicondylar Fracture of Left/Right Tibial  Plateau:                    -edema mgt as advised per orthopedics 3. Cervical Fracture ( C7): cleared from a NS standpoing 4. Insomnia,auditory hallucinations-TBI behavioral changes       -continue seroquel 200mg  XR at bedtime         -continue nortriptyline at 25mg  qhs        -continue celexa 20mg  qhs for depression                                           -  trial of depakote 500mg  bid for mood control, headaches and appetite. Check level next month as well as LFT's.  If he does well with this, likely will wean gabapentin to off.   Fifteen minutes of face to face patient care time were spent during this visit. All questions were encouraged and answered.  Follow up with NP in a month .

## 2021-05-29 NOTE — Patient Instructions (Signed)
PLEASE FEEL FREE TO CALL OUR OFFICE WITH ANY PROBLEMS OR QUESTIONS ([phone])      

## 2021-05-30 ENCOUNTER — Telehealth: Payer: Self-pay

## 2021-05-30 NOTE — Telephone Encounter (Signed)
LVM re: No show visit 05/28/21. Pt to speak with Freida Busman prior to rescheduling.

## 2021-06-04 ENCOUNTER — Other Ambulatory Visit: Payer: Self-pay

## 2021-06-05 ENCOUNTER — Other Ambulatory Visit: Payer: Self-pay

## 2021-06-07 ENCOUNTER — Other Ambulatory Visit: Payer: Self-pay

## 2021-06-18 ENCOUNTER — Ambulatory Visit: Payer: Medicaid Other | Admitting: Physical Medicine and Rehabilitation

## 2021-07-01 ENCOUNTER — Other Ambulatory Visit: Payer: Self-pay

## 2021-07-01 ENCOUNTER — Encounter: Payer: Medicare Other | Attending: Registered Nurse | Admitting: Registered Nurse

## 2021-07-01 ENCOUNTER — Encounter: Payer: Self-pay | Admitting: Registered Nurse

## 2021-07-01 VITALS — BP 128/90 | HR 70 | Temp 98.4°F | Ht 72.0 in | Wt 143.0 lb

## 2021-07-01 DIAGNOSIS — R4689 Other symptoms and signs involving appearance and behavior: Secondary | ICD-10-CM | POA: Diagnosis present

## 2021-07-01 DIAGNOSIS — F3289 Other specified depressive episodes: Secondary | ICD-10-CM

## 2021-07-01 DIAGNOSIS — M2550 Pain in unspecified joint: Secondary | ICD-10-CM

## 2021-07-01 DIAGNOSIS — S069X9S Unspecified intracranial injury with loss of consciousness of unspecified duration, sequela: Secondary | ICD-10-CM

## 2021-07-01 DIAGNOSIS — S069X0S Unspecified intracranial injury without loss of consciousness, sequela: Secondary | ICD-10-CM | POA: Diagnosis present

## 2021-07-01 DIAGNOSIS — S069XAS Unspecified intracranial injury with loss of consciousness status unknown, sequela: Secondary | ICD-10-CM

## 2021-07-01 DIAGNOSIS — G4709 Other insomnia: Secondary | ICD-10-CM

## 2021-07-01 NOTE — Progress Notes (Addendum)
Subjective:    Patient ID: Gary Ashley, male    DOB: Jun 08, 1981, 40 y.o.   MRN: [id number]  HPI: Gary Ashley is a 40 y.o. male who returns for follow up appointment of his polytrauma, he reports his mood has improved with Depakote, Dr Riley Kill note was reviewed LFT and Depakote lecel ordered. Mr. Gary Ashley understanding.  He states he has generalized joint pain. He  rates his pain 7. His current exercise regime is walking and performing stretching exercises.   Pain Inventory Average Pain 7 Pain Right Now 7 My pain is sharp, burning, stabbing, and aching  In the last 24 hours, has pain interfered with the following? General activity 4 Relation with others 5 Enjoyment of life 5 What TIME of day is your pain at its worst? evening and night Sleep (in general) Fair  Pain is worse with: walking, inactivity, and some activites Pain improves with: rest, heat/ice, medication, and injections Relief from Meds: 6  Family History  Problem Relation Age of Onset   Healthy Mother    Healthy Father    Social History   Socioeconomic History   Marital status: Single    Spouse name: Not on file   Number of children: Not on file   Years of education: Not on file   Highest education level: Not on file  Occupational History   Not on file  Tobacco Use   Smoking status: Some Days    Types: Cigarettes   Smokeless tobacco: Never  Vaping Use   Vaping Use: Never used  Substance and Sexual Activity   Alcohol use: Not Currently   Drug use: Yes    Types: Marijuana    Comment: sometimes   Sexual activity: Yes  Other Topics Concern   Not on file  Social History Narrative   Not on file   Social Determinants of Health   Financial Resource Strain: Not on file  Food Insecurity: Not on file  Transportation Needs: Not on file  Physical Activity: Not on file  Stress: Not on file  Social Connections: Not on file   Past Surgical History:  Procedure Laterality Date    APPENDECTOMY     EXTERNAL FIXATION LEG Bilateral 02/18/2019   Procedure: EXTERNAL FIXATION RIGHT LEG, I&D LEFT LEG;  Surgeon: Roby Lofts, MD;  Location: [facility];  Service: Orthopedics;  Laterality: Bilateral;   KNEE ARTHROSCOPY WITH LATERAL MENISECTOMY Right 01/10/2021   Procedure: RIGHT KNEE ARTHROSCOPY WITH LATERAL MENISECTOMY DEBRIDEMENT CHONDROPLASTY  LYSIS OF ADHESIONS WITH MANIPULATION;  Surgeon: Bjorn Pippin, MD;  Location: [facility];  Service: Orthopedics;  Laterality: Right;   ORIF TIBIA PLATEAU Right 02/21/2019   Procedure: OPEN REDUCTION INTERNAL FIXATION (ORIF) TIBIAL PLATEAU;  Surgeon: Roby Lofts, MD;  Location: [facility];  Service: Orthopedics;  Laterality: Right;   Past Surgical History:  Procedure Laterality Date   APPENDECTOMY     EXTERNAL FIXATION LEG Bilateral 02/18/2019   Procedure: EXTERNAL FIXATION RIGHT LEG, I&D LEFT LEG;  Surgeon: Roby Lofts, MD;  Location: [facility];  Service: Orthopedics;  Laterality: Bilateral;   KNEE ARTHROSCOPY WITH LATERAL MENISECTOMY Right 01/10/2021   Procedure: RIGHT KNEE ARTHROSCOPY WITH LATERAL MENISECTOMY DEBRIDEMENT CHONDROPLASTY  LYSIS OF ADHESIONS WITH MANIPULATION;  Surgeon: Bjorn Pippin, MD;  Location: [facility];  Service: Orthopedics;  Laterality: Right;   ORIF TIBIA PLATEAU Right 02/21/2019   Procedure: OPEN REDUCTION INTERNAL FIXATION (ORIF) TIBIAL PLATEAU;  Surgeon: Roby Lofts, MD;  Location: [facility];  Service: Orthopedics;  Laterality: Right;   Past Medical History:  Diagnosis Date   Anxiety    Bilateral pneumothorax    C7 cervical fracture (HCC)    Depression    Hypertension    SAH (subarachnoid hemorrhage) (HCC)    Sinus tachycardia    TBI (traumatic brain injury)    Vitamin D deficiency    Temp 98.4 F (36.9 C)   Ht 6' (1.829 m)   Wt 143 lb (64.9 kg)   BMI 19.39 kg/m   Opioid Risk Score:   Fall Risk Score:  `1  Depression screen PHQ 2/9  Depression screen [facility] 2/9 07/01/2021  05/29/2021 05/02/2020 02/29/2020 10/05/2019 06/10/2019 03/16/2019  Decreased Interest 0 1 0 3 3 2 3   Down, Depressed, Hopeless 1 1 0 3 3 2 3   PHQ - 2 Score 1 2 0 6 6 4 6   Altered sleeping - - - 3 2 3 3   Tired, decreased energy - - - 3 2 3  0  Change in appetite - - - 0 0 0 0  Feeling bad or failure about yourself  - - - 1 2 0 3  Trouble concentrating - - - 3 2 0 3  Moving slowly or fidgety/restless - - - 3 2 2  0  Suicidal thoughts - - - 3 1 2  0  PHQ-9 Score - - - 22 17 14 15   Difficult doing work/chores - - - - Very difficult - -     Review of Systems  Constitutional: Negative.   HENT: Negative.    Eyes: Negative.   Respiratory: Negative.    Cardiovascular: Negative.   Gastrointestinal: Negative.   Endocrine: Negative.   Genitourinary: Negative.   Musculoskeletal: Negative.   Skin: Negative.   Allergic/Immunologic: Negative.   Neurological: Negative.   Hematological: Negative.   Psychiatric/Behavioral:  Positive for dysphoric mood.       Objective:   Physical Exam Vitals and nursing note reviewed.  Constitutional:      Appearance: Normal appearance.  Cardiovascular:     Rate and Rhythm: Normal rate and regular rhythm.     Pulses: Normal pulses.     Heart sounds: Normal heart sounds.  Pulmonary:     Effort: Pulmonary effort is normal.     Breath sounds: Normal breath sounds.  Musculoskeletal:     Cervical back: Normal range of motion and neck supple.     Comments: Normal Muscle Bulk and Muscle Testing Reveals:  Upper Extremities: Full ROM and Muscle Strength 5/5 Lower Extremities: Full ROM and Muscle Strength 5/5 Arises from Table with ease Narrow Based  Gait     Skin:    General: Skin is warm and dry.  Neurological:     Mental Status: He is alert and oriented to person, place, and time.  Psychiatric:        Mood and Affect: Mood normal.        Behavior: Behavior normal.         Assessment & Plan:  1. Multiple Trauma with TBI: Continue Seroquel, Depakote and  Amitriptyline .RX: Depakote and LFT lab ordered. Continue to Monitor. 07/01/2021 2. Closed Bicondylar Fracture of Right Tibial Plateau: Orthopedic Following. Continue to Monitor. 07/01/2021 3. Cervical Fracture ( C7): Per Dr note he is cleared from Neurosurgery. Standpoint. Continue to monitor. 07/01/2021 4. Insomnia, TBI Behavioral Changes: Continue Seroquel, Amitriptylline  5. Depression: Continue Celexa. Continue to monitor.  6. Mood: Continue Seroquel and Depakote: RX: Hepatic Panel  and Depakote level [facility] was called. Mr. Simmering hasn't picked up Gabapentin since January 2022, Seroquel on 04/12/2021, Depakote on 06/07/2021, Nortriptyline and Celexa since March 2022. This provider will call Mr. Force to inquire regarding the above.   F/U in 1 month

## 2021-07-02 ENCOUNTER — Telehealth: Payer: Self-pay | Admitting: Registered Nurse

## 2021-07-02 NOTE — Telephone Encounter (Signed)
Patient is unable to get his lab work done until this Thursday 07/04/21.  Any questions please call patient.

## 2021-07-05 ENCOUNTER — Telehealth: Payer: Self-pay | Admitting: Registered Nurse

## 2021-07-05 LAB — HEPATIC FUNCTION PANEL
ALT: 18 IU/L (ref 0–44)
AST: 21 IU/L (ref 0–40)
Albumin: 5.2 g/dL — ABNORMAL HIGH (ref 4.0–5.0)
Alkaline Phosphatase: 52 IU/L (ref 44–121)
Bilirubin Total: 0.2 mg/dL (ref 0.0–1.2)
Bilirubin, Direct: <0.1 mg/dL (ref 0.00–0.40)
Total Protein: 8.1 g/dL (ref 6.0–8.5)

## 2021-07-05 LAB — VALPROIC ACID LEVEL: Valproic Acid Lvl: <4 ug/mL — ABNORMAL LOW (ref 50–100)

## 2021-07-05 NOTE — Telephone Encounter (Addendum)
Patient called back and said he did go to the lab yesterday.

## 2021-07-05 NOTE — Telephone Encounter (Signed)
Placed a call to Gary Ashley regarding lab results.  No answer, left message to return the call.

## 2021-07-08 NOTE — Telephone Encounter (Signed)
Placed a call to Mr. Cottam,  He states he is compliant with his Depakote, reports it has helped his mood.  [facility] reports his Depakote was picked up on 06/07/2021, Dr Riley Kill sent in prescription on 05/29/2021. His Depakote level is <4, this will be discussed with Dr Riley Kill.  Mr. Hagger is aware of the above and verbalizes understanding.

## 2021-07-31 ENCOUNTER — Encounter: Payer: Medicare Other | Attending: Registered Nurse | Admitting: Registered Nurse

## 2021-07-31 ENCOUNTER — Encounter: Payer: Self-pay | Admitting: Registered Nurse

## 2021-07-31 ENCOUNTER — Other Ambulatory Visit: Payer: Self-pay

## 2021-07-31 VITALS — BP 143/86 | HR 75 | Temp 98.4°F | Ht 72.0 in | Wt 137.0 lb

## 2021-07-31 DIAGNOSIS — S069X9S Unspecified intracranial injury with loss of consciousness of unspecified duration, sequela: Secondary | ICD-10-CM | POA: Insufficient documentation

## 2021-07-31 DIAGNOSIS — F3289 Other specified depressive episodes: Secondary | ICD-10-CM | POA: Diagnosis present

## 2021-07-31 DIAGNOSIS — M2550 Pain in unspecified joint: Secondary | ICD-10-CM | POA: Diagnosis present

## 2021-07-31 DIAGNOSIS — S069X0S Unspecified intracranial injury without loss of consciousness, sequela: Secondary | ICD-10-CM | POA: Insufficient documentation

## 2021-07-31 DIAGNOSIS — G4709 Other insomnia: Secondary | ICD-10-CM | POA: Diagnosis present

## 2021-07-31 DIAGNOSIS — R4689 Other symptoms and signs involving appearance and behavior: Secondary | ICD-10-CM | POA: Insufficient documentation

## 2021-07-31 NOTE — Progress Notes (Signed)
Subjective:    Patient ID: Gary Ashley, male    DOB: Aug 14, 1981, 40 y.o.   MRN: [id number]  HPI: Gary Ashley is a 40 y.o. male who returns for follow up appointment of his polytrauma, he reports his mood has improved with Depakote,  He states his pain is located in his lower extremities occasionally. He  rates his pain 7. His current exercise regime is walking and performing stretching exercises.    Pain Inventory Average Pain 6 Pain Right Now 7 My pain is sharp, burning, stabbing, and aching  In the last 24 hours, has pain interfered with the following? General activity 7 Relation with others 7 Enjoyment of life 7 What TIME of day is your pain at its worst? evening and night Sleep (in general) Fair  Pain is worse with: walking, bending, inactivity, standing, and some activites Pain improves with: heat/ice, medication, and injections Relief from Meds: 6  Family History  Problem Relation Age of Onset   Healthy Mother    Healthy Father    Social History   Socioeconomic History   Marital status: Single    Spouse name: Not on file   Number of children: Not on file   Years of education: Not on file   Highest education level: Not on file  Occupational History   Not on file  Tobacco Use   Smoking status: Some Days    Types: Cigarettes   Smokeless tobacco: Never  Vaping Use   Vaping Use: Never used  Substance and Sexual Activity   Alcohol use: Not Currently   Drug use: Yes    Types: Marijuana    Comment: sometimes   Sexual activity: Yes  Other Topics Concern   Not on file  Social History Narrative   Not on file   Social Determinants of Health   Financial Resource Strain: Not on file  Food Insecurity: Not on file  Transportation Needs: Not on file  Physical Activity: Not on file  Stress: Not on file  Social Connections: Not on file   Past Surgical History:  Procedure Laterality Date   APPENDECTOMY     EXTERNAL FIXATION LEG Bilateral  02/18/2019   Procedure: EXTERNAL FIXATION RIGHT LEG, I&D LEFT LEG;  Surgeon: Roby Lofts, MD;  Location: [facility];  Service: Orthopedics;  Laterality: Bilateral;   KNEE ARTHROSCOPY WITH LATERAL MENISECTOMY Right 01/10/2021   Procedure: RIGHT KNEE ARTHROSCOPY WITH LATERAL MENISECTOMY DEBRIDEMENT CHONDROPLASTY  LYSIS OF ADHESIONS WITH MANIPULATION;  Surgeon: Bjorn Pippin, MD;  Location: [facility];  Service: Orthopedics;  Laterality: Right;   ORIF TIBIA PLATEAU Right 02/21/2019   Procedure: OPEN REDUCTION INTERNAL FIXATION (ORIF) TIBIAL PLATEAU;  Surgeon: Roby Lofts, MD;  Location: [facility];  Service: Orthopedics;  Laterality: Right;   Past Surgical History:  Procedure Laterality Date   APPENDECTOMY     EXTERNAL FIXATION LEG Bilateral 02/18/2019   Procedure: EXTERNAL FIXATION RIGHT LEG, I&D LEFT LEG;  Surgeon: Roby Lofts, MD;  Location: [facility];  Service: Orthopedics;  Laterality: Bilateral;   KNEE ARTHROSCOPY WITH LATERAL MENISECTOMY Right 01/10/2021   Procedure: RIGHT KNEE ARTHROSCOPY WITH LATERAL MENISECTOMY DEBRIDEMENT CHONDROPLASTY  LYSIS OF ADHESIONS WITH MANIPULATION;  Surgeon: Bjorn Pippin, MD;  Location: [facility];  Service: Orthopedics;  Laterality: Right;   ORIF TIBIA PLATEAU Right 02/21/2019   Procedure: OPEN REDUCTION INTERNAL FIXATION (ORIF) TIBIAL PLATEAU;  Surgeon: Roby Lofts, MD;  Location: [facility];  Service: Orthopedics;  Laterality:  Right;   Past Medical History:  Diagnosis Date   Anxiety    Bilateral pneumothorax    C7 cervical fracture (HCC)    Depression    Hypertension    SAH (subarachnoid hemorrhage) (HCC)    Sinus tachycardia    TBI (traumatic brain injury)    Vitamin D deficiency    BP (!) 143/86   Pulse 75   Temp 98.4 F (36.9 C)   Ht 6' (1.829 m)   Wt 137 lb (62.1 kg)   SpO2 95%   BMI 18.58 kg/m   Opioid Risk Score:   Fall Risk Score:  `1  Depression screen PHQ 2/9  Depression screen [facility] 2/9 07/01/2021 05/29/2021  05/02/2020 02/29/2020 10/05/2019 06/10/2019 03/16/2019  Decreased Interest 0 1 0 3 3 2 3   Down, Depressed, Hopeless 1 1 0 3 3 2 3   PHQ - 2 Score 1 2 0 6 6 4 6   Altered sleeping - - - 3 2 3 3   Tired, decreased energy - - - 3 2 3  0  Change in appetite - - - 0 0 0 0  Feeling bad or failure about yourself  - - - 1 2 0 3  Trouble concentrating - - - 3 2 0 3  Moving slowly or fidgety/restless - - - 3 2 2  0  Suicidal thoughts - - - 3 1 2  0  PHQ-9 Score - - - 22 17 14 15   Difficult doing work/chores - - - - Very difficult - -     Review of Systems  Constitutional: Negative.   HENT: Negative.    Eyes: Negative.   Respiratory: Negative.    Cardiovascular: Negative.   Endocrine: Negative.   Genitourinary: Negative.   Musculoskeletal: Negative.   Skin: Negative.   Allergic/Immunologic: Negative.   Neurological: Negative.   Hematological: Negative.   Psychiatric/Behavioral: Negative.        Objective:   Physical Exam Vitals and nursing note reviewed.  Constitutional:      Appearance: Normal appearance.  Cardiovascular:     Rate and Rhythm: Normal rate and regular rhythm.     Pulses: Normal pulses.     Heart sounds: Normal heart sounds.  Pulmonary:     Effort: Pulmonary effort is normal.     Breath sounds: Normal breath sounds.  Musculoskeletal:     Cervical back: Normal range of motion and neck supple.     Comments: Normal Muscle Bulk and Muscle Testing Reveals:  Upper Extremities: Full ROM and Muscle Strength 5/5 Lower Extremities: Full ROM and Muscle Strength 5/5 Arises from Table with ease Narrow Based  Gait     Skin:    General: Skin is warm and dry.  Neurological:     Mental Status: He is alert and oriented to person, place, and time.  Psychiatric:        Mood and Affect: Mood normal.        Behavior: Behavior normal.         Assessment & Plan:  1. Multiple Trauma with TBI: Continue Seroquel, Depakote and Amitriptyline. Gary Ashley states he is compliant with his  Depakote and only takes the Celexa, Seroquel and Amitriptyline as needed. Educated on medication compliance. He verbalizes understanding.  07/31/2021 .RX: Depakote  Level .Continue to Monitor. 07/31/2021 2. Closed Bicondylar Fracture of Right Tibial Plateau: Orthopedic Following. Continue to Monitor. 07/31/2021 3. Cervical Fracture ( C7): Per Dr note he is cleared from Neurosurgery. Standpoint. Continue to monitor. 07/31/2021 4. Insomnia,  TBI Behavioral Changes: Continue Seroquel, Amitriptyline. 07/31/2021  5. Depression: Continue Celexa. Continue to monitor. 07/31/2021 6. Mood: Continue Seroquel and Depakote: RX:  Depakote level [facility] was called on 07/01/2021. Gary Ashley hasn't picked up Gabapentin since January 2022, Seroquel on 04/12/2021, Depakote on 06/07/2021, Nortriptyline and Celexa since March 2022. This provider spoke with Gary Ashley regarding medication compliance, he verbalizes understanding.     F/U in 3 months

## 2021-08-14 LAB — VALPROIC ACID LEVEL: Valproic Acid Lvl: 21 ug/mL — ABNORMAL LOW (ref 50–100)

## 2021-08-16 ENCOUNTER — Other Ambulatory Visit: Payer: Self-pay

## 2021-08-16 ENCOUNTER — Ambulatory Visit: Payer: Medicare Other | Attending: Nurse Practitioner

## 2021-08-16 DIAGNOSIS — Z23 Encounter for immunization: Secondary | ICD-10-CM

## 2021-08-28 ENCOUNTER — Telehealth: Payer: Self-pay | Admitting: Registered Nurse

## 2021-08-28 NOTE — Telephone Encounter (Signed)
This provider spoke with Dr Riley Kill regarding mr. Gary Ashley, we will continue to monitor. No changes in the medication at this time.

## 2021-09-24 ENCOUNTER — Other Ambulatory Visit: Payer: Self-pay | Admitting: Nurse Practitioner

## 2021-09-24 ENCOUNTER — Other Ambulatory Visit: Payer: Self-pay

## 2021-09-24 DIAGNOSIS — R000 Tachycardia, unspecified: Secondary | ICD-10-CM

## 2021-09-24 DIAGNOSIS — G8918 Other acute postprocedural pain: Secondary | ICD-10-CM

## 2021-09-24 MED ORDER — METOPROLOL TARTRATE 25 MG PO TABS
12.5000 mg | ORAL_TABLET | Freq: Two times a day (BID) | ORAL | 1 refills | Status: DC
  Filled 2021-09-24 – 2021-10-28 (×3): qty 30, 30d supply, fill #0

## 2021-09-24 MED ORDER — GABAPENTIN 300 MG PO CAPS
300.0000 mg | ORAL_CAPSULE | Freq: Three times a day (TID) | ORAL | 0 refills | Status: DC
  Filled 2021-09-24 – 2021-10-28 (×3): qty 90, 30d supply, fill #0

## 2021-09-25 ENCOUNTER — Ambulatory Visit: Payer: Commercial Managed Care - HMO | Attending: Nurse Practitioner | Admitting: Nurse Practitioner

## 2021-09-30 ENCOUNTER — Other Ambulatory Visit: Payer: Self-pay

## 2021-10-16 ENCOUNTER — Other Ambulatory Visit: Payer: Self-pay

## 2021-10-16 ENCOUNTER — Encounter: Payer: Medicare Other | Attending: Registered Nurse | Admitting: Physical Medicine & Rehabilitation

## 2021-10-16 ENCOUNTER — Encounter: Payer: Self-pay | Admitting: Physical Medicine & Rehabilitation

## 2021-10-16 VITALS — BP 118/82 | HR 71 | Temp 99.1°F | Ht 72.0 in | Wt 140.0 lb

## 2021-10-16 DIAGNOSIS — F329 Major depressive disorder, single episode, unspecified: Secondary | ICD-10-CM | POA: Diagnosis present

## 2021-10-16 DIAGNOSIS — R4689 Other symptoms and signs involving appearance and behavior: Secondary | ICD-10-CM | POA: Diagnosis present

## 2021-10-16 DIAGNOSIS — S069X0S Unspecified intracranial injury without loss of consciousness, sequela: Secondary | ICD-10-CM | POA: Diagnosis present

## 2021-10-16 DIAGNOSIS — S069X9S Unspecified intracranial injury with loss of consciousness of unspecified duration, sequela: Secondary | ICD-10-CM | POA: Insufficient documentation

## 2021-10-16 MED ORDER — QUETIAPINE FUMARATE ER 300 MG PO TB24
300.0000 mg | ORAL_TABLET | Freq: Every day | ORAL | 3 refills | Status: DC
  Filled 2021-10-16 – 2021-10-23 (×2): qty 30, 30d supply, fill #0

## 2021-10-16 MED ORDER — CITALOPRAM HYDROBROMIDE 40 MG PO TABS
40.0000 mg | ORAL_TABLET | Freq: Every day | ORAL | 3 refills | Status: DC
  Filled 2021-10-16 – 2021-10-23 (×2): qty 30, 30d supply, fill #0

## 2021-10-16 NOTE — Patient Instructions (Signed)
PLEASE FEEL FREE TO CALL OUR OFFICE WITH ANY PROBLEMS OR QUESTIONS ([phone])      
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
## 2021-10-18 ENCOUNTER — Other Ambulatory Visit: Payer: Self-pay

## 2021-10-23 ENCOUNTER — Other Ambulatory Visit: Payer: Self-pay

## 2021-10-24 ENCOUNTER — Encounter: Payer: Medicare Other | Admitting: Psychology

## 2021-10-24 ENCOUNTER — Other Ambulatory Visit: Payer: Self-pay

## 2021-10-25 ENCOUNTER — Other Ambulatory Visit: Payer: Self-pay

## 2021-10-28 ENCOUNTER — Other Ambulatory Visit: Payer: Self-pay

## 2021-11-05 ENCOUNTER — Ambulatory Visit: Payer: Self-pay | Admitting: *Deleted

## 2021-11-05 NOTE — Telephone Encounter (Signed)
Last OV was 2 years ago.   Attempt to call patient to advise of Mobile Unit and or other options for an appt.   Left message on voicemail to return call.
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
## 2021-11-06 ENCOUNTER — Telehealth: Payer: Self-pay

## 2021-11-06 ENCOUNTER — Telehealth: Payer: Self-pay | Admitting: *Deleted
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
## 2021-11-06 NOTE — Telephone Encounter (Signed)
Spoke to pt and he is going to [facility] and said he would see [place] his next appt.

## 2021-11-20 ENCOUNTER — Other Ambulatory Visit: Payer: Self-pay

## 2021-11-20 ENCOUNTER — Ambulatory Visit: Payer: Medicare Other | Attending: Nurse Practitioner | Admitting: Nurse Practitioner

## 2021-11-20 ENCOUNTER — Encounter: Payer: Self-pay | Admitting: Nurse Practitioner

## 2021-11-20 VITALS — BP 114/81 | HR 81 | Resp 18 | Ht 72.0 in | Wt 134.2 lb

## 2021-11-20 DIAGNOSIS — R0781 Pleurodynia: Secondary | ICD-10-CM | POA: Diagnosis not present

## 2021-11-20 DIAGNOSIS — Z1159 Encounter for screening for other viral diseases: Secondary | ICD-10-CM | POA: Diagnosis not present

## 2021-11-20 DIAGNOSIS — R197 Diarrhea, unspecified: Secondary | ICD-10-CM

## 2021-11-20 DIAGNOSIS — Z5181 Encounter for therapeutic drug level monitoring: Secondary | ICD-10-CM

## 2021-11-20 DIAGNOSIS — Z114 Encounter for screening for human immunodeficiency virus [HIV]: Secondary | ICD-10-CM

## 2021-11-20 DIAGNOSIS — Z0000 Encounter for general adult medical examination without abnormal findings: Secondary | ICD-10-CM

## 2021-11-20 DIAGNOSIS — D649 Anemia, unspecified: Secondary | ICD-10-CM

## 2021-11-20 DIAGNOSIS — E559 Vitamin D deficiency, unspecified: Secondary | ICD-10-CM

## 2021-11-20 MED ORDER — METHOCARBAMOL 500 MG PO TABS
500.0000 mg | ORAL_TABLET | Freq: Four times a day (QID) | ORAL | 1 refills | Status: DC
  Filled 2021-11-20: qty 150, 19d supply, fill #0

## 2021-11-20 NOTE — Progress Notes (Signed)
Assessment & Plan:  Gary Ashley was seen today for annual exam.  Diagnoses and all orders for this visit:  Encounter for annual physical exam  Rib pain on right side -     DG Chest 2 View; Future -     methocarbamol (ROBAXIN) 500 MG tablet; Take 1-2 tablets (500-1,000 mg total) by mouth 4 (four) times daily.  Encounter for screening for HIV -     HIV antibody (with reflex)  Need for hepatitis C screening test -     HCV Ab w Reflex to Quant PCR  Diarrhea of presumed infectious origin -     Thyroid Panel With TSH -     CMP14+EGFR -     Giardia, EIA; Ova/Parasite  Vitamin D deficiency disease -     CMP14+EGFR -     VITAMIN D 25 Hydroxy (Vit-D Deficiency, Fractures)  Anemia, unspecified type -     Thyroid Panel With TSH -     CBC  Therapeutic drug monitoring -     Valproic acid level    Patient has been counseled on age-appropriate routine health concerns for screening and prevention. These are reviewed and up-to-date. Referrals have been placed accordingly. Immunizations are up-to-date or declined.    Subjective:   Chief Complaint  Patient presents with   Annual Exam   HPI Gary Ashley 41 y.o. male presents to office today for annual physical exam.  He has a past medical history of Anxiety, Bilateral pneumothorax, C7 cervical fracture, Depression (taking celexa and seroquel), Hypertension, SAH, Sinus tachycardia, TBI with associated cognitive and behavioral deficits (Taking depakote), and Vitamin D deficiency.   Notes right sided rib cage pain with deep breathing, twisting and turning. Denies cough or cardiac chest pain.   GI States he has been experiencing loose stools since December 2022. Having more than 4 loose BMs daily. Denies any dietary changes or history of IBS, Crohns, hematochezia or melena.    HTN Blood pressure is well controlled. It does not appear he has picked up his metoprolol from the pharmacy.  BP Readings from Last 3 Encounters:   11/20/21 114/81  10/16/21 118/82  07/31/21 (!) 143/86    Review of Systems  Constitutional:  Negative for fever, malaise/fatigue and weight loss.  HENT: Negative.  Negative for nosebleeds.   Eyes: Negative.  Negative for blurred vision, double vision and photophobia.  Respiratory: Negative.  Negative for cough and shortness of breath.   Cardiovascular: Negative.  Negative for chest pain, palpitations and leg swelling.  Gastrointestinal:  Positive for diarrhea. Negative for abdominal pain, blood in stool, constipation, heartburn, melena, nausea and vomiting.  Genitourinary: Negative.   Musculoskeletal: Negative.  Negative for myalgias.  Skin: Negative.   Neurological: Negative.  Negative for dizziness, focal weakness, seizures and headaches.  Endo/Heme/Allergies: Negative.   Psychiatric/Behavioral: Negative.  Negative for suicidal ideas.    Past Medical History:  Diagnosis Date   Anxiety    Bilateral pneumothorax    C7 cervical fracture (HCC)    Depression    Hypertension    SAH (subarachnoid hemorrhage) (HCC)    Sinus tachycardia    TBI (traumatic brain injury)    Vitamin D deficiency     Past Surgical History:  Procedure Laterality Date   APPENDECTOMY     EXTERNAL FIXATION LEG Bilateral 02/18/2019   Procedure: EXTERNAL FIXATION RIGHT LEG, I&D LEFT LEG;  Surgeon: Roby Lofts, MD;  Location: [facility];  Service: Orthopedics;  Laterality: Bilateral;   KNEE  ARTHROSCOPY WITH LATERAL MENISECTOMY Right 01/10/2021   Procedure: RIGHT KNEE ARTHROSCOPY WITH LATERAL MENISECTOMY DEBRIDEMENT CHONDROPLASTY  LYSIS OF ADHESIONS WITH MANIPULATION;  Surgeon: Bjorn Pippin, MD;  Location: [facility];  Service: Orthopedics;  Laterality: Right;   ORIF TIBIA PLATEAU Right 02/21/2019   Procedure: OPEN REDUCTION INTERNAL FIXATION (ORIF) TIBIAL PLATEAU;  Surgeon: Roby Lofts, MD;  Location: [facility];  Service: Orthopedics;  Laterality: Right;    Family History  Problem Relation Age of  Onset   Healthy Mother    Healthy Father     Social History Reviewed with no changes to be made today.   Outpatient Medications Prior to Visit  Medication Sig Dispense Refill   citalopram (CELEXA) 40 MG tablet Take 1 tablet (40 mg total) by mouth at bedtime. 30 tablet 3   diclofenac (VOLTAREN) 75 MG EC tablet Take 1 tablet (75 mg total) by mouth 2 (two) times daily. For pain/inflammation 60 tablet 0   divalproex (DEPAKOTE) 500 MG DR tablet Take 1 tablet (500 mg total) by mouth 2 (two) times daily. 60 tablet 3   gabapentin (NEURONTIN) 300 MG capsule Take 1 capsule (300 mg total) by mouth 3 (three) times daily. 90 capsule 0   meloxicam (MOBIC) 7.5 MG tablet Take 1 tablet by mouth twice a day as needed for pain 60 tablet 1   metoprolol tartrate (LOPRESSOR) 25 MG tablet Take 0.5 tablets (12.5 mg total) by mouth 2 (two) times daily. 30 tablet 1   QUEtiapine (SEROQUEL XR) 300 MG 24 hr tablet Take 1 tablet (300 mg total) by mouth at bedtime. 30 tablet 3   methocarbamol (ROBAXIN) 500 MG tablet Take 1-2 tablets (500-1,000 mg total) by mouth 4 (four) times daily. 150 tablet 1   No facility-administered medications prior to visit.    Allergies  Allergen Reactions   Penicillins     Swelling Tolerated Cephalosporin 02/22/2019         Objective:    BP 114/81   Pulse 81   Resp 18   Ht 6' (1.829 m)   Wt 134 lb 4 oz (60.9 kg)   SpO2 95%   BMI 18.21 kg/m  Wt Readings from Last 3 Encounters:  11/20/21 134 lb 4 oz (60.9 kg)  10/16/21 140 lb (63.5 kg)  07/31/21 137 lb (62.1 kg)    Physical Exam Constitutional:      Appearance: He is well-developed.  HENT:     Head: Normocephalic and atraumatic.     Right Ear: Hearing, tympanic membrane, ear canal and external ear normal.     Left Ear: Hearing, tympanic membrane, ear canal and external ear normal.     Nose: Nose normal. No mucosal edema or rhinorrhea.     Right Turbinates: Not enlarged.     Left Turbinates: Not enlarged.      Mouth/Throat:     Lips: Pink.     Mouth: Mucous membranes are moist.     Dentition: No gingival swelling, dental abscesses or gum lesions.     Pharynx: Uvula midline.     Tonsils: No tonsillar exudate. 1+ on the right. 1+ on the left.  Eyes:     General: Lids are normal. No scleral icterus.    Extraocular Movements: Extraocular movements intact.     Conjunctiva/sclera: Conjunctivae normal.     Pupils: Pupils are equal, round, and reactive to light.  Neck:     Thyroid: No thyromegaly.     Trachea: No tracheal deviation.  Cardiovascular:  Rate and Rhythm: Normal rate and regular rhythm.     Heart sounds: Normal heart sounds. No murmur heard.   No friction rub. No gallop.  Pulmonary:     Effort: Pulmonary effort is normal. No respiratory distress.     Breath sounds: Normal breath sounds. No wheezing or rales.  Chest:     Chest wall: No mass or tenderness.  Breasts:    Right: No inverted nipple, mass, nipple discharge, skin change or tenderness.     Left: No inverted nipple, mass, nipple discharge, skin change or tenderness.  Abdominal:     General: Bowel sounds are normal. There is no distension.     Palpations: Abdomen is soft. There is no mass.     Tenderness: There is no abdominal tenderness. There is no guarding or rebound.  Musculoskeletal:        General: No tenderness or deformity. Normal range of motion.     Cervical back: Normal range of motion and neck supple.  Lymphadenopathy:     Cervical: No cervical adenopathy.  Skin:    General: Skin is warm and dry.     Capillary Refill: Capillary refill takes less than 2 seconds.     Findings: No erythema.  Neurological:     Mental Status: He is alert and oriented to person, place, and time.     Cranial Nerves: No cranial nerve deficit.     Sensory: Sensation is intact.     Motor: No abnormal muscle tone.     Coordination: Coordination is intact. Coordination normal.     Gait: Gait is intact.     Deep Tendon Reflexes:  Reflexes normal.     Reflex Scores:      Patellar reflexes are 1+ on the right side and 1+ on the left side. Psychiatric:        Attention and Perception: Attention normal.        Mood and Affect: Mood normal.        Speech: Speech normal.        Behavior: Behavior normal.        Thought Content: Thought content normal.        Judgment: Judgment normal.         Patient has been counseled extensively about nutrition and exercise as well as the importance of adherence with medications and regular follow-up. The patient was given clear instructions to go to ER or return to medical center if symptoms don't improve, worsen or new problems develop. The patient verbalized understanding.   Follow-up: Return in about 6 months (around 05/23/2022).   Claiborne Rigg, FNP-BC [facility] [place], [place] [phone]   11/20/2021, 10:15 PM

## 2021-11-21 ENCOUNTER — Encounter: Payer: Self-pay | Admitting: Nurse Practitioner

## 2021-11-21 LAB — HCV AB W REFLEX TO QUANT PCR: HCV Ab: NONREACTIVE

## 2021-11-21 LAB — CBC
Hematocrit: 48.5 % (ref 37.5–51.0)
Hemoglobin: 16.4 g/dL (ref 13.0–17.7)
MCH: 30.8 pg (ref 26.6–33.0)
MCHC: 33.8 g/dL (ref 31.5–35.7)
MCV: 91 fL (ref 79–97)
Platelets: 309 10*3/uL (ref 150–450)
RBC: 5.33 x10E6/uL (ref 4.14–5.80)
RDW: 14.2 % (ref 11.6–15.4)
WBC: 7 10*3/uL (ref 3.4–10.8)

## 2021-11-21 LAB — CMP14+EGFR
ALT: 15 IU/L (ref 0–44)
AST: 25 IU/L (ref 0–40)
Albumin/Globulin Ratio: 1.5 (ref 1.2–2.2)
Albumin: 5.6 g/dL — ABNORMAL HIGH (ref 4.0–5.0)
Alkaline Phosphatase: 74 IU/L (ref 44–121)
BUN/Creatinine Ratio: 13 (ref 9–20)
BUN: 14 mg/dL (ref 6–24)
Bilirubin Total: 0.9 mg/dL (ref 0.0–1.2)
CO2: 21 mmol/L (ref 20–29)
Calcium: 10.4 mg/dL — ABNORMAL HIGH (ref 8.7–10.2)
Chloride: 94 mmol/L — ABNORMAL LOW (ref 96–106)
Creatinine, Ser: 1.08 mg/dL (ref 0.76–1.27)
Globulin, Total: 3.7 g/dL (ref 1.5–4.5)
Glucose: 73 mg/dL (ref 70–99)
Potassium: 4.4 mmol/L (ref 3.5–5.2)
Sodium: 136 mmol/L (ref 134–144)
Total Protein: 9.3 g/dL — ABNORMAL HIGH (ref 6.0–8.5)
eGFR: 88 mL/min/{1.73_m2} (ref >59)

## 2021-11-21 LAB — VITAMIN D 25 HYDROXY (VIT D DEFICIENCY, FRACTURES): Vit D, 25-Hydroxy: 10.9 ng/mL — ABNORMAL LOW (ref 30.0–100.0)

## 2021-11-21 LAB — THYROID PANEL WITH TSH
Free Thyroxine Index: 2.1 (ref 1.2–4.9)
T3 Uptake Ratio: 27 % (ref 24–39)
T4, Total: 7.8 ug/dL (ref 4.5–12.0)
TSH: 0.43 u[IU]/mL — ABNORMAL LOW (ref 0.450–4.500)

## 2021-11-21 LAB — VALPROIC ACID LEVEL: Valproic Acid Lvl: <4 ug/mL — ABNORMAL LOW (ref 50–100)

## 2021-11-21 LAB — HIV ANTIBODY (ROUTINE TESTING W REFLEX): HIV Screen 4th Generation wRfx: NONREACTIVE

## 2021-11-21 LAB — HCV INTERPRETATION

## 2021-11-22 ENCOUNTER — Other Ambulatory Visit: Payer: Self-pay

## 2021-11-24 ENCOUNTER — Other Ambulatory Visit: Payer: Self-pay | Admitting: Nurse Practitioner

## 2021-11-24 MED ORDER — VITAMIN D (ERGOCALCIFEROL) 1.25 MG (50000 UNIT) PO CAPS
50000.0000 [IU] | ORAL_CAPSULE | ORAL | 1 refills | Status: DC
  Filled 2021-11-24: qty 4, 28d supply, fill #0

## 2021-11-25 ENCOUNTER — Other Ambulatory Visit: Payer: Self-pay

## 2021-11-26 ENCOUNTER — Ambulatory Visit
Admission: RE | Admit: 2021-11-26 | Discharge: 2021-11-26 | Disposition: A | Discharge status: Home / Self Care | Payer: Medicare Other | Source: Ambulatory Visit | Attending: Nurse Practitioner | Admitting: Nurse Practitioner

## 2021-11-26 DIAGNOSIS — R0781 Pleurodynia: Secondary | ICD-10-CM

## 2021-12-02 ENCOUNTER — Other Ambulatory Visit: Payer: Self-pay

## 2021-12-05 ENCOUNTER — Other Ambulatory Visit: Payer: Self-pay | Admitting: Nurse Practitioner

## 2021-12-05 DIAGNOSIS — R197 Diarrhea, unspecified: Secondary | ICD-10-CM

## 2021-12-05 LAB — GIARDIA, EIA; OVA/PARASITE: Giardia Ag, Stl: NEGATIVE

## 2021-12-10 ENCOUNTER — Encounter: Payer: Self-pay | Admitting: Gastroenterology

## 2021-12-18 ENCOUNTER — Ambulatory Visit
Admission: RE | Admit: 2021-12-18 | Discharge: 2021-12-18 | Disposition: A | Discharge status: Home / Self Care | Payer: Medicare Other | Source: Ambulatory Visit | Attending: Emergency Medicine | Admitting: Emergency Medicine

## 2021-12-18 ENCOUNTER — Ambulatory Visit (INDEPENDENT_AMBULATORY_CARE_PROVIDER_SITE_OTHER): Payer: Medicare Other

## 2021-12-18 ENCOUNTER — Other Ambulatory Visit (HOSPITAL_COMMUNITY): Payer: Self-pay

## 2021-12-18 ENCOUNTER — Other Ambulatory Visit: Payer: Self-pay

## 2021-12-18 VITALS — BP 134/91 | HR 63 | Temp 97.2°F | Resp 17

## 2021-12-18 DIAGNOSIS — M25531 Pain in right wrist: Secondary | ICD-10-CM | POA: Diagnosis not present

## 2021-12-18 MED ORDER — KETOROLAC TROMETHAMINE 30 MG/ML IJ SOLN
30.0000 mg | Freq: Once | INTRAMUSCULAR | Status: AC
  Administered 2021-12-18: 30 mg via INTRAMUSCULAR

## 2021-12-18 MED ORDER — MELOXICAM 15 MG PO TABS
15.0000 mg | ORAL_TABLET | Freq: Every day | ORAL | 0 refills | Status: AC
  Filled 2021-12-18 (×3): qty 14, 14d supply, fill #0

## 2021-12-18 NOTE — ED Provider Notes (Signed)
? ? ?Provider Note ? ?Patient Contact: 12:26 PM (approximate) ? ? ?History  ? ?Wrist Pain (Entered by patient) ? ? ?HPI ? ?Gary Ashley is a 41 y.o. male presents to the urgent care with acute right wrist pain.  Patient states that his pain started yesterday and seems swollen him.  He states that he was moving his wrist around more than usual.  He denies falls or mechanisms of trauma.  No numbness or tingling. ? ?  ? ? ?Physical Exam  ? ?Triage Vital Signs: ?ED Triage Vitals  ?Enc Vitals Group  ?   BP 12/18/21 1220 (!) 134/91  ?   Pulse Rate 12/18/21 1220 63  ?   Resp 12/18/21 1220 17  ?   Temp 12/18/21 1220 (!) 97.2 ?F (36.2 ?C)  ?   Temp src --   ?   SpO2 12/18/21 1220 95 %  ?   Weight --   ?   Height --   ?   Head Circumference --   ?   Peak Flow --   ?   Pain Score 12/18/21 1218 7  ?   Pain Loc --   ?   Pain Edu? --   ?   Excl. in GC? --   ? ? ?Most recent vital signs: ?Vitals:  ? 12/18/21 1220  ?BP: (!) 134/91  ?Pulse: 63  ?Resp: 17  ?Temp: (!) 97.2 ?F (36.2 ?C)  ?SpO2: 95%  ? ? ? ?General: Alert and in no acute distress. ?Eyes:  PERRL. EOMI. ?Head: No acute traumatic findings ?ENT: ?     Ears:  ?     Nose: No congestion/rhinnorhea. ?     Mouth/Throat: Mucous membranes are moist. ?Neck: No stridor. No cervical spine tenderness to palpation. ?Cardiovascular:  Good peripheral perfusion ?Respiratory: Normal respiratory effort without tachypnea or retractions. Lungs CTAB. Good air entry to the bases with no decreased or absent breath sounds. ?Gastrointestinal: Bowel sounds ?4 quadrants. Soft and nontender to palpation. No guarding or rigidity. No palpable masses. No distention. No CVA tenderness. ?Musculoskeletal: Patient performs limited range of motion at the right wrist.  Patient can move all 5 right fingers.  Palpable radial ulnar pulses bilaterally and symmetrically.  Capillary refill less than 2 seconds on the right. ?Neurologic:  No gross focal neurologic deficits are appreciated.  ?Skin:   No rash  noted ?Other: ? ? ?ED Results / Procedures / Treatments  ? ?Labs ?(all labs ordered are listed, but only abnormal results are displayed) ?Labs Reviewed - No data to display ? ? ? ?RADIOLOGY ? ?I personally viewed and evaluated these images as part of my medical decision making, as well as reviewing the written report by the radiologist. ? ?ED Provider Interpretation:  ? ? ?PROCEDURES: ? ?Critical Care performed: No ? ?Procedures ? ? ?MEDICATIONS ORDERED IN ED: ?Medications  ?ketorolac (TORADOL) 30 MG/ML injection 30 mg (30 mg Intramuscular Given 12/18/21 1302)  ? ? ? ?IMPRESSION / MDM / ASSESSMENT AND PLAN / ED COURSE  ?I reviewed the triage vital signs and the nursing notes. ?             ?               ? ?Assessment and plan ?Wrist pain ? ?Differential diagnosis includes, but is not limited to, wrist pain, fracture, flexor tenosynovitis, carpal tunnel, ? ?41 year old male presents to the urgent care with acute right wrist pain for the past 2 days. ? ?Vital signs  were reassuring at triage.  On physical exam, patient was alert, active and nontoxic-appearing.  Patient performed limited range of motion at the right wrist. ? ?X-ray of the right wrist shows no bony abnormality.  Will place patient in a Velcro wrist splint and given injection of Toradol for pain.  Patient was discharged with meloxicam.  Return precautions were given to return with new or worsening symptoms. ? ?FINAL CLINICAL IMPRESSION(S) / ED DIAGNOSES  ? ?Final diagnoses:  ?Right wrist pain  ? ? ? ?Rx / DC Orders  ? ?ED Discharge Orders   ? ?      Ordered  ?  meloxicam (MOBIC) 15 MG tablet  Daily       ? 12/18/21 1248  ? ?  ?  ? ?  ? ? ? ?Note:  This document was prepared using Dragon voice recognition software and may include unintentional dictation errors. ?  ?Orvil Feil, PA-C ?12/18/21 1323 ? ?

## 2021-12-18 NOTE — Discharge Instructions (Signed)
Take meloxicam once daily for pain and inflammation. ?Please continue to wear wrist splint at night. ?

## 2021-12-18 NOTE — ED Triage Notes (Signed)
Pt is present today with right wrist pain. Pt states the pain and swelling started Monday. Pt denies any injury  ?

## 2021-12-19 ENCOUNTER — Other Ambulatory Visit (HOSPITAL_COMMUNITY): Payer: Self-pay

## 2021-12-19 ENCOUNTER — Other Ambulatory Visit: Payer: Self-pay

## 2021-12-19 MED ORDER — CHLORHEXIDINE GLUCONATE 0.12 % MT SOLN
15.0000 mL | Freq: Two times a day (BID) | OROMUCOSAL | 3 refills | Status: DC
  Filled 2021-12-19: qty 946, 30d supply, fill #0
  Filled 2021-12-23: qty 946, 32d supply, fill #0

## 2021-12-23 ENCOUNTER — Other Ambulatory Visit (HOSPITAL_COMMUNITY): Payer: Self-pay

## 2021-12-27 ENCOUNTER — Ambulatory Visit (INDEPENDENT_AMBULATORY_CARE_PROVIDER_SITE_OTHER): Payer: Medicare Other | Admitting: Gastroenterology

## 2021-12-27 ENCOUNTER — Other Ambulatory Visit (HOSPITAL_COMMUNITY): Payer: Self-pay

## 2021-12-27 ENCOUNTER — Encounter: Payer: Self-pay | Admitting: Gastroenterology

## 2021-12-27 ENCOUNTER — Other Ambulatory Visit: Payer: Medicare Other

## 2021-12-27 VITALS — BP 138/86 | HR 62 | Ht 72.0 in | Wt 147.0 lb

## 2021-12-27 DIAGNOSIS — K529 Noninfective gastroenteritis and colitis, unspecified: Secondary | ICD-10-CM

## 2021-12-27 DIAGNOSIS — R140 Abdominal distension (gaseous): Secondary | ICD-10-CM | POA: Diagnosis not present

## 2021-12-27 DIAGNOSIS — R1030 Lower abdominal pain, unspecified: Secondary | ICD-10-CM

## 2021-12-27 DIAGNOSIS — R143 Flatulence: Secondary | ICD-10-CM | POA: Diagnosis not present

## 2021-12-27 MED ORDER — DICYCLOMINE HCL 10 MG PO CAPS
10.0000 mg | ORAL_CAPSULE | Freq: Four times a day (QID) | ORAL | 0 refills | Status: DC | PRN
  Filled 2021-12-27: qty 45, 12d supply, fill #0
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
## 2021-12-27 NOTE — Patient Instructions (Addendum)
Your provider has requested that you go to the basement level for lab work before leaving today. Press "B" on the elevator. The lab is located at the first door on the left as you exit the elevator. ? ?We have sent the following medications to your pharmacy for you to pick up at your convenience: ?Dicyclomine 10 mg every 6 hours as needed for cramps/diarrhea ? ?You have been scheduled for a follow up with Dr Myrtie Neither on 02/07/22 at 8:20 am. ? ?If you are age 42 or older, your body mass index should be between 23-30. Your Body mass index is 19.94 kg/m?[name] If this is out of the aforementioned range listed, please consider follow up with your Primary Care Provider. ? ?If you are age 42 or younger, your body mass index should be between 19-25. Your Body mass index is 19.94 kg/m?[name] If this is out of the aformentioned range listed, please consider follow up with your Primary Care Provider.  ? ?________________________________________________________ ? ?The [facility] providers would like to encourage you to use [facility] to communicate with providers for non-urgent requests or questions.  Due to long hold times on the telephone, sending your provider a message by [facility] may be a faster and more efficient way to get a response.  Please allow 48 business hours for a response.  Please remember that this is for non-urgent requests.  ?_______________________________________________________ ? ?Due to recent changes in healthcare laws, you may see the results of your imaging and laboratory studies on MyChart before your provider has had a chance to review them.  We understand that in some cases there may be results that are confusing or concerning to you. Not all laboratory results come back in the same time frame and the provider may be waiting for multiple results in order to interpret others.  Please give [place] 48 hours in order for your provider to thoroughly review all the results before contacting the office for clarification of your  results.  ? ?

## 2021-12-29 LAB — TISSUE TRANSGLUTAMINASE, IGA: (tTG) Ab, IgA: <1 U/mL

## 2021-12-29 LAB — IGA: Immunoglobulin A: 235 mg/dL (ref 47–310)

## 2021-12-30 ENCOUNTER — Other Ambulatory Visit (HOSPITAL_COMMUNITY): Payer: Self-pay

## 2021-12-31 ENCOUNTER — Telehealth: Payer: Self-pay

## 2021-12-31 NOTE — Telephone Encounter (Signed)
Contacted pt to schedule Welcome to Medicare with pcp pt didn't answer lvm  ?

## 2021-12-31 NOTE — Telephone Encounter (Signed)
Copied from [place] [id number]. Topic: Appointment Scheduling - Scheduling Inquiry for Clinic ?>> Dec 31, 2021 12:35 PM Scherrie Gerlach wrote: ?Reason for CRM: pt called back to schedule medicare well visit. ?

## 2021-12-31 NOTE — Telephone Encounter (Signed)
Returned pt call to schedule pt didn't answer lvm  ?

## 2022-01-15 ENCOUNTER — Ambulatory Visit: Payer: Medicare Other | Admitting: Nurse Practitioner

## 2022-01-15 ENCOUNTER — Ambulatory Visit: Payer: Medicare Other

## 2022-01-29 ENCOUNTER — Encounter: Payer: Medicare Other | Attending: Registered Nurse | Admitting: Physical Medicine & Rehabilitation

## 2022-01-29 ENCOUNTER — Encounter: Payer: Self-pay | Admitting: Physical Medicine & Rehabilitation

## 2022-01-29 ENCOUNTER — Other Ambulatory Visit (HOSPITAL_COMMUNITY): Payer: Self-pay

## 2022-01-29 VITALS — BP 128/89 | HR 69 | Ht 72.0 in | Wt 136.6 lb

## 2022-01-29 DIAGNOSIS — S069X0S Unspecified intracranial injury without loss of consciousness, sequela: Secondary | ICD-10-CM | POA: Diagnosis present

## 2022-01-29 DIAGNOSIS — F4310 Post-traumatic stress disorder, unspecified: Secondary | ICD-10-CM | POA: Diagnosis present

## 2022-01-29 DIAGNOSIS — F329 Major depressive disorder, single episode, unspecified: Secondary | ICD-10-CM | POA: Diagnosis present

## 2022-01-29 DIAGNOSIS — R4689 Other symptoms and signs involving appearance and behavior: Secondary | ICD-10-CM | POA: Diagnosis present

## 2022-01-29 MED ORDER — PRAZOSIN HCL 2 MG PO CAPS
2.0000 mg | ORAL_CAPSULE | Freq: Every day | ORAL | 5 refills | Status: DC
  Filled 2022-01-29: qty 30, 30d supply, fill #0
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
## 2022-01-29 NOTE — Patient Instructions (Signed)
PLEASE FEEL FREE TO CALL OUR OFFICE WITH ANY PROBLEMS OR QUESTIONS ([phone])      

## 2022-01-30 LAB — HEPATIC FUNCTION PANEL
ALT: 16 IU/L (ref 0–44)
AST: 19 IU/L (ref 0–40)
Albumin: 4.7 g/dL (ref 4.0–5.0)
Alkaline Phosphatase: 51 IU/L (ref 44–121)
Bilirubin Total: 0.3 mg/dL (ref 0.0–1.2)
Bilirubin, Direct: 0.11 mg/dL (ref 0.00–0.40)
Total Protein: 7.6 g/dL (ref 6.0–8.5)

## 2022-01-30 LAB — VALPROIC ACID LEVEL: Valproic Acid Lvl: <4 ug/mL — ABNORMAL LOW (ref 50–100)

## 2022-02-07 ENCOUNTER — Ambulatory Visit (INDEPENDENT_AMBULATORY_CARE_PROVIDER_SITE_OTHER): Payer: Medicare Other | Admitting: Gastroenterology

## 2022-02-07 ENCOUNTER — Encounter: Payer: Self-pay | Admitting: Gastroenterology

## 2022-02-07 VITALS — BP 106/84 | HR 80 | Ht 70.75 in | Wt 135.1 lb

## 2022-02-07 DIAGNOSIS — K580 Irritable bowel syndrome with diarrhea: Secondary | ICD-10-CM | POA: Diagnosis not present

## 2022-02-07 DIAGNOSIS — E739 Lactose intolerance, unspecified: Secondary | ICD-10-CM | POA: Diagnosis not present

## 2022-02-07 NOTE — Progress Notes (Signed)
[facility] Progress Note  Chief Complaint: Chronic diarrhea  Subjective  History: Gary Ashley follows up after his initial office consult on 12/27/2021 with chronic diarrhea.  Overall clinical picture seem most consistent with IBS, stool study negative for ova and parasite celiac antibodies done, dicyclomine prescribed.  He says the medicine has helped him a lot, and he typically takes it twice a day.  He has rare abdominal cramps and his stool is formed.  No rectal bleeding, appetite good and weight stable. When asked about food triggers, he found it difficult to keep track but has noticed cramps and loose stool sometimes after having milk or cheese  ROS: Cardiovascular:  no chest pain Respiratory: no dyspnea  The patient's Past Medical, Family and Social History were reviewed and are on file in the EMR.  Objective:  Med list reviewed  Current Outpatient Medications:    chlorhexidine (PERIDEX) 0.12 % solution, Rinse mouth with 15 mls 2 times daily as directed. Do not swallow, Disp: 946 mL, Rfl: 3   citalopram (CELEXA) 40 MG tablet, Take 1 tablet (40 mg total) by mouth at bedtime., Disp: 30 tablet, Rfl: 3   diclofenac (VOLTAREN) 75 MG EC tablet, Take 1 tablet (75 mg total) by mouth 2 (two) times daily. For pain/inflammation, Disp: 60 tablet, Rfl: 0   dicyclomine (BENTYL) 10 MG capsule, Take 1 capsule (10 mg total) by mouth every 6 (six) hours as needed (cramps/diarrhea)., Disp: 45 capsule, Rfl: 0   divalproex (DEPAKOTE) 500 MG DR tablet, Take 1 tablet (500 mg total) by mouth 2 (two) times daily., Disp: 60 tablet, Rfl: 3   gabapentin (NEURONTIN) 300 MG capsule, Take 1 capsule (300 mg total) by mouth 3 (three) times daily., Disp: 90 capsule, Rfl: 0   methocarbamol (ROBAXIN) 500 MG tablet, Take 1-2 tablets (500-1,000 mg total) by mouth 4 (four) times daily., Disp: 150 tablet, Rfl: 1   metoprolol tartrate (LOPRESSOR) 25 MG tablet, Take 0.5 tablets (12.5 mg total) by mouth 2 (two)  times daily., Disp: 30 tablet, Rfl: 1   prazosin (MINIPRESS) 2 MG capsule, Take 1 capsule by mouth at bedtime., Disp: 30 capsule, Rfl: 5   QUEtiapine (SEROQUEL XR) 300 MG 24 hr tablet, Take 1 tablet (300 mg total) by mouth at bedtime., Disp: 30 tablet, Rfl: 3   Vital signs in last 24 hrs: Vitals:   02/07/22 0808  BP: 106/84  Pulse: 80   Wt Readings from Last 3 Encounters:  02/07/22 135 lb 2 oz (61.3 kg)  01/29/22 136 lb 9.6 oz (62 kg)  12/27/21 147 lb (66.7 kg)    Physical Exam  Well-appearing  Cardiac: Regular, no murmur,  no peripheral edema Pulm: clear to auscultation bilaterally, normal RR and effort noted Abdomen: soft, no tenderness, with active bowel sounds. No guarding or palpable hepatosplenomegaly.   Labs:  Celiac antibody negative ___________________________________________ Radiologic studies:   ____________________________________________ Other:   _____________________________________________ Assessment & Plan  Assessment: Encounter Diagnoses  Name Primary?   Irritable bowel syndrome with diarrhea Yes   Lactose intolerance    Mild IBS with superimposed lactose intolerance. Closer attention to dietary triggers, try Lactaid milk or enzyme supplements if available/affordable. Try to go to as needed use of dicyclomine, will be refilled when needed. Plan: See me as needed  20 minutes were spent on this encounter (including chart review, history/exam, counseling/coordination of care, and documentation) > 50% of that time was spent on counseling and coordination of care.   Nelida Meuse III

## 2022-02-07 NOTE — Patient Instructions (Signed)
If you are age 42 or older, your body mass index should be between 23-30. Your Body mass index is 18.98 kg/m. If this is out of the aforementioned range listed, please consider follow up with your Primary Care Provider.  If you are age 86 or younger, your body mass index should be between 19-25. Your Body mass index is 18.98 kg/m. If this is out of the aformentioned range listed, please consider follow up with your Primary Care Provider.   ________________________________________________________  The [facility] providers would like to encourage you to use [facility] to communicate with providers for non-urgent requests or questions.  Due to long hold times on the telephone, sending your provider a message by [facility] may be a faster and more efficient way to get a response.  Please allow 48 business hours for a response.  Please remember that this is for non-urgent requests.  _______________________________________________________  It was a pleasure to see you today!  Thank you for trusting me with your gastrointestinal care!

## 2022-02-25 ENCOUNTER — Other Ambulatory Visit (HOSPITAL_COMMUNITY): Payer: Self-pay

## 2022-02-25 MED ORDER — CLINDAMYCIN HCL 300 MG PO CAPS
ORAL_CAPSULE | ORAL | 0 refills | Status: DC
  Filled 2022-02-25: qty 21, 7d supply, fill #0

## 2022-02-25 MED ORDER — HYDROCODONE-ACETAMINOPHEN 5-325 MG PO TABS
ORAL_TABLET | ORAL | 0 refills | Status: DC
  Filled 2022-02-25: qty 12, 3d supply, fill #0

## 2022-02-25 MED ORDER — AMOXICILLIN 500 MG PO CAPS
ORAL_CAPSULE | ORAL | 0 refills | Status: DC
  Filled 2022-02-25: qty 21, 7d supply, fill #0

## 2022-02-26 ENCOUNTER — Other Ambulatory Visit (HOSPITAL_COMMUNITY): Payer: Self-pay

## 2022-04-18 ENCOUNTER — Encounter: Payer: Self-pay | Admitting: Nurse Practitioner

## 2022-04-18 ENCOUNTER — Ambulatory Visit: Payer: Medicare Other | Attending: Nurse Practitioner | Admitting: Nurse Practitioner

## 2022-04-18 ENCOUNTER — Other Ambulatory Visit (HOSPITAL_COMMUNITY): Payer: Self-pay

## 2022-04-18 VITALS — BP 144/96 | HR 67 | Temp 98.2°F | Ht 70.0 in | Wt 127.4 lb

## 2022-04-18 DIAGNOSIS — E559 Vitamin D deficiency, unspecified: Secondary | ICD-10-CM | POA: Diagnosis not present

## 2022-04-18 DIAGNOSIS — R7989 Other specified abnormal findings of blood chemistry: Secondary | ICD-10-CM | POA: Diagnosis not present

## 2022-04-18 DIAGNOSIS — Z0000 Encounter for general adult medical examination without abnormal findings: Secondary | ICD-10-CM

## 2022-04-18 MED ORDER — VITAMIN D (ERGOCALCIFEROL) 1.25 MG (50000 UNIT) PO CAPS
50000.0000 [IU] | ORAL_CAPSULE | ORAL | 1 refills | Status: DC
  Filled 2022-04-18: qty 12, 84d supply, fill #0

## 2022-04-18 NOTE — Patient Instructions (Signed)
Health Maintenance, Male Adopting a healthy lifestyle and getting preventive care are important in promoting health and wellness. Ask your health care provider about: The right schedule for you to have regular tests and exams. Things you can do on your own to prevent diseases and keep yourself healthy. What should I know about diet, weight, and exercise? Eat a healthy diet  Eat a diet that includes plenty of vegetables, fruits, low-fat dairy products, and lean protein. Do not eat a lot of foods that are high in solid fats, added sugars, or sodium. Maintain a healthy weight Body mass index (BMI) is a measurement that can be used to identify possible weight problems. It estimates body fat based on height and weight. Your health care provider can help determine your BMI and help you achieve or maintain a healthy weight. Get regular exercise Get regular exercise. This is one of the most important things you can do for your health. Most adults should: Exercise for at least 150 minutes each week. The exercise should increase your heart rate and make you sweat (moderate-intensity exercise). Do strengthening exercises at least twice a week. This is in addition to the moderate-intensity exercise. Spend less time sitting. Even light physical activity can be beneficial. Watch cholesterol and blood lipids Have your blood tested for lipids and cholesterol at 42 years of age, then have this test every 5 years. You may need to have your cholesterol levels checked more often if: Your lipid or cholesterol levels are high. You are older than 42 years of age. You are at high risk for heart disease. What should I know about cancer screening? Many types of cancers can be detected early and may often be prevented. Depending on your health history and family history, you may need to have cancer screening at various ages. This may include screening for: Colorectal cancer. Prostate cancer. Skin cancer. Lung  cancer. What should I know about heart disease, diabetes, and high blood pressure? Blood pressure and heart disease High blood pressure causes heart disease and increases the risk of stroke. This is more likely to develop in people who have high blood pressure readings or are overweight. Talk with your health care provider about your target blood pressure readings. Have your blood pressure checked: Every 3-5 years if you are 18-39 years of age. Every year if you are 40 years old or older. If you are between the ages of 65 and 75 and are a current or former smoker, ask your health care provider if you should have a one-time screening for abdominal aortic aneurysm (AAA). Diabetes Have regular diabetes screenings. This checks your fasting blood sugar level. Have the screening done: Once every three years after age 45 if you are at a normal weight and have a low risk for diabetes. More often and at a younger age if you are overweight or have a high risk for diabetes. What should I know about preventing infection? Hepatitis B If you have a higher risk for hepatitis B, you should be screened for this virus. Talk with your health care provider to find out if you are at risk for hepatitis B infection. Hepatitis C Blood testing is recommended for: Everyone born from 1945 through 1965. Anyone with known risk factors for hepatitis C. Sexually transmitted infections (STIs) You should be screened each year for STIs, including gonorrhea and chlamydia, if: You are sexually active and are younger than 42 years of age. You are older than 42 years of age and your   health care provider tells you that you are at risk for this type of infection. Your sexual activity has changed since you were last screened, and you are at increased risk for chlamydia or gonorrhea. Ask your health care provider if you are at risk. Ask your health care provider about whether you are at high risk for HIV. Your health care provider  may recommend a prescription medicine to help prevent HIV infection. If you choose to take medicine to prevent HIV, you should first get tested for HIV. You should then be tested every 3 months for as long as you are taking the medicine. Follow these instructions at home: Alcohol use Do not drink alcohol if your health care provider tells you not to drink. If you drink alcohol: Limit how much you have to 0-2 drinks a day. Know how much alcohol is in your drink. In the [place] one drink equals one 12 oz bottle of beer (355 mL), one 5 oz glass of wine (148 mL), or one 1 oz glass of hard liquor (44 mL). Lifestyle Do not use any products that contain nicotine or tobacco. These products include cigarettes, chewing tobacco, and vaping devices, such as e-cigarettes. If you need help quitting, ask your health care provider. Do not use street drugs. Do not share needles. Ask your health care provider for help if you need support or information about quitting drugs. General instructions Schedule regular health, dental, and eye exams. Stay current with your vaccines. Tell your health care provider if: You often feel depressed. You have ever been abused or do not feel safe at home. Summary Adopting a healthy lifestyle and getting preventive care are important in promoting health and wellness. Follow your health care provider's instructions about healthy diet, exercising, and getting tested or screened for diseases. Follow your health care provider's instructions on monitoring your cholesterol and blood pressure. This information is not intended to replace advice given to you by your health care provider. Make sure you discuss any questions you have with your health care provider. Document Revised: 01/21/2021 Document Reviewed: 01/21/2021 Elsevier Patient Education  2023 [employer]  Steps to Quit Smoking Smoking tobacco is the leading cause of preventable death. It can affect almost every organ in  the body. Smoking puts you and people around you at risk for many serious, long-lasting (chronic) diseases. Quitting smoking can be hard, but it is one of the best things that you can do for your health. It is never too late to quit. Do not give up if you cannot quit the first time. Some people need to try many times to quit. Do your best to stick to your quit plan, and talk with your doctor if you have any questions or concerns. How do I get ready to quit? Pick a date to quit. Set a date within the next 2 weeks to give you time to prepare. Write down the reasons why you are quitting. Keep this list in places where you will see it often. Tell your family, friends, and co-workers that you are quitting. Their support is important. Talk with your doctor about the choices that may help you quit. Find out if your health insurance will pay for these treatments. Know the people, places, things, and activities that make you want to smoke (triggers). Avoid them. What first steps can I take to quit smoking? Throw away all cigarettes at home, at work, and in your car. Throw away the things that you use when you smoke,  such as Set designer. Clean your car. Empty the ashtray. Clean your home, including curtains and carpets. What can I do to help me quit smoking? Talk with your doctor about taking medicines and seeing a counselor. You are more likely to succeed when you do both. If you are pregnant or breastfeeding: Talk with your doctor about counseling or other ways to quit smoking. Do not take medicine to help you quit smoking unless your doctor tells you to. Quit right away Quit smoking completely, instead of slowly cutting back on how much you smoke over a period of time. Stopping smoking right away may be more successful than slowly quitting. Go to counseling. In-person is best if this is an option. You are more likely to quit if you go to counseling sessions regularly. Take medicine You may  take medicines to help you quit. Some medicines need a prescription, and some you can buy over-the-counter. Some medicines may contain a drug called nicotine to replace the nicotine in cigarettes. Medicines may: Help you stop having the desire to smoke (cravings). Help to stop the problems that come when you stop smoking (withdrawal symptoms). Your doctor may ask you to use: Nicotine patches, gum, or lozenges. Nicotine inhalers or sprays. Non-nicotine medicine that you take by mouth. Find resources Find resources and other ways to help you quit smoking and remain smoke-free after you quit. They include: Online chats with a Veterinary surgeon. Phone quitlines. Printed Materials engineer. Support groups or group counseling. Text messaging programs. Mobile phone apps. Use apps on your mobile phone or tablet that can help you stick to your quit plan. Examples of free services include Quit Guide from the CDC and smokefree.gov  What can I do to make it easier to quit?  Talk to your family and friends. Ask them to support and encourage you. Call a phone quitline, such as 1-800-QUIT-NOW, reach out to support groups, or work with a Veterinary surgeon. Ask people who smoke to not smoke around you. Avoid places that make you want to smoke, such as: Bars. Parties. Smoke-break areas at work. Spend time with people who do not smoke. Lower the stress in your life. Stress can make you want to smoke. Try these things to lower stress: Getting regular exercise. Doing deep-breathing exercises. Doing yoga. Meditating. What benefits will I see if I quit smoking? Over time, you may have: A better sense of smell and taste. Less coughing and sore throat. A slower heart rate. Lower blood pressure. Clearer skin. Better breathing. Fewer sick days. Summary Quitting smoking can be hard, but it is one of the best things that you can do for your health. Do not give up if you cannot quit the first time. Some people need to  try many times to quit. When you decide to quit smoking, make a plan to help you succeed. Quit smoking right away, not slowly over a period of time. When you start quitting, get help and support to keep you smoke-free. This information is not intended to replace advice given to you by your health care provider. Make sure you discuss any questions you have with your health care provider. Document Revised: 08/23/2021 Document Reviewed: 08/23/2021 Elsevier Patient Education  2023 [employer].

## 2022-04-18 NOTE — Progress Notes (Signed)
Subjective:   Gary Ashley is a 41 y.o. male who presents for a Welcome to Medicare exam.    Review of Systems  Constitutional:  Negative for fever, malaise/fatigue and weight loss.  HENT: Negative.  Negative for nosebleeds.   Eyes: Negative.  Negative for blurred vision, double vision and photophobia.  Respiratory: Negative.  Negative for cough and shortness of breath.   Cardiovascular: Negative.  Negative for chest pain, palpitations and leg swelling.  Gastrointestinal: Negative.  Negative for heartburn, nausea and vomiting.  Musculoskeletal: Negative.  Negative for myalgias.  Neurological: Negative.  Negative for dizziness, focal weakness, seizures and headaches.  Psychiatric/Behavioral: Negative.  Negative for suicidal ideas.           Objective:    Today's Vitals   04/18/22 1347  BP: (!) 144/96  Pulse: 67  Temp: 98.2 F (36.8 C)  TempSrc: Oral  SpO2: 98%  Weight: 127 lb 6.4 oz (57.8 kg)  Height: 5\' 10"  (1.778 m)   Body mass index is 18.28 kg/m.  Medications Outpatient Encounter Medications as of 04/18/2022  Medication Sig   chlorhexidine (PERIDEX) 0.12 % solution Rinse mouth with 15 mls 2 times daily as directed. Do not swallow   citalopram (CELEXA) 40 MG tablet Take 1 tablet (40 mg total) by mouth at bedtime.   clindamycin (CLEOCIN) 300 MG capsule Take 1 capsule by mouth 3 times daily.   diclofenac (VOLTAREN) 75 MG EC tablet Take 1 tablet (75 mg total) by mouth 2 (two) times daily. For pain/inflammation   dicyclomine (BENTYL) 10 MG capsule Take 1 capsule (10 mg total) by mouth every 6 (six) hours as needed (cramps/diarrhea).   divalproex (DEPAKOTE) 500 MG DR tablet Take 1 tablet (500 mg total) by mouth 2 (two) times daily.   gabapentin (NEURONTIN) 300 MG capsule Take 1 capsule (300 mg total) by mouth 3 (three) times daily.   HYDROcodone-acetaminophen (NORCO/VICODIN) 5-325 MG tablet Take 1 tablet by mouth every 4-6 hours as needed for pain   methocarbamol  (ROBAXIN) 500 MG tablet Take 1-2 tablets (500-1,000 mg total) by mouth 4 (four) times daily.   metoprolol tartrate (LOPRESSOR) 25 MG tablet Take 0.5 tablets (12.5 mg total) by mouth 2 (two) times daily.   prazosin (MINIPRESS) 2 MG capsule Take 1 capsule by mouth at bedtime.   QUEtiapine (SEROQUEL XR) 300 MG 24 hr tablet Take 1 tablet (300 mg total) by mouth at bedtime.   Vitamin D, Ergocalciferol, (DRISDOL) 1.25 MG (50000 UNIT) CAPS capsule Take 1 capsule (50,000 Units total) by mouth every 7 (seven) days.   No facility-administered encounter medications on file as of 04/18/2022.     History: Past Medical History:  Diagnosis Date   Anxiety    Bilateral pneumothorax    C7 cervical fracture (HCC)    Depression    Hypertension    SAH (subarachnoid hemorrhage) (HCC)    Sinus tachycardia    TBI (traumatic brain injury) (HCC)    Vitamin D deficiency    Past Surgical History:  Procedure Laterality Date   APPENDECTOMY     EXTERNAL FIXATION LEG Bilateral 02/18/2019   Procedure: EXTERNAL FIXATION RIGHT LEG, I&D LEFT LEG;  Surgeon: 04/20/2019, MD;  Location: [facility] OR;  Service: Orthopedics;  Laterality: Bilateral;   KNEE ARTHROSCOPY WITH LATERAL MENISECTOMY Right 01/10/2021   Procedure: RIGHT KNEE ARTHROSCOPY WITH LATERAL MENISECTOMY DEBRIDEMENT CHONDROPLASTY  LYSIS OF ADHESIONS WITH MANIPULATION;  Surgeon: 01/12/2021, MD;  Location:  [facility];  Service:  Orthopedics;  Laterality: Right;   ORIF TIBIA PLATEAU Right 02/21/2019   Procedure: OPEN REDUCTION INTERNAL FIXATION (ORIF) TIBIAL PLATEAU;  Surgeon: Roby Lofts, MD;  Location: [facility] OR;  Service: Orthopedics;  Laterality: Right;    Family History  Problem Relation Age of Onset   Healthy Mother    Healthy Father    Colon cancer Neg Hx    Stomach cancer Neg Hx    Esophageal cancer Neg Hx    Colon polyps Neg Hx    Social History   Occupational History   Occupation: [employer]  Tobacco Use   Smoking status: Some Days     Types: Cigarettes   Smokeless tobacco: Never  Vaping Use   Vaping Use: Never used  Substance and Sexual Activity   Alcohol use: Not Currently   Drug use: Yes    Types: Marijuana    Comment: sometimes   Sexual activity: Yes   Tobacco Counseling Ready to quit: No Counseling given: Yes   Immunizations and Health Maintenance Immunization History  Administered Date(s) Administered   Influenza,inj,Quad PF,6+ Mos 08/16/2021   Tdap 02/19/2019   Health Maintenance Due  Topic Date Due   INFLUENZA VACCINE  04/15/2022    Activities of Daily Living    INDEPENDENT          Physical Exam  11-20-2021(optional), or other factors deemed appropriate based on the beneficiary's medical and social history and current clinical standards.  Advanced Directives:NONE. Declines additional information      Assessment:    This is a welcome to medicare  examination for Gary Ashley  Vision/Hearing screen Vision Screening   Right eye Left eye Both eyes  Without correction 20/25 20/25 20/13   With correction       Dietary issues and exercise activities discussed:   Strengthening exercises, walks daily for exercise. Tries to get in fruits and vegetables daily.    Goals   None     Depression Screen    04/18/2022    1:53 PM 01/29/2022   10:41 AM 11/20/2021    3:36 PM 10/16/2021   10:33 AM  PHQ 2/9 Scores  PHQ - 2 Score 5 2 5 2   PHQ- 9 Score 18  16      Fall Risk    04/18/2022    1:41 PM  Fall Risk   Falls in the past year? 0  Number falls in past yr: 0  Injury with Fall? 0    Cognitive Function        04/18/2022    1:41 PM  6CIT Screen  What Year? 4 points  What month? 3 points  What time? 0 points  Count back from 20 4 points  Months in reverse 4 points  Repeat phrase 2 points  Total Score 17 points    Patient Care Team: 06/18/2022, NP as PCP - General (Nurse Practitioner)     Plan:   [place] was seen today for medicare wellness.  Diagnoses and all  orders for this visit:  Welcome to Medicare preventive visit  Low thyroid stimulating hormone (TSH) level -     TSH -     T3, Free -     T4, Free  Vitamin D deficiency disease -     VITAMIN D 25 Hydroxy (Vit-D Deficiency, Fractures) -     Vitamin D, Ergocalciferol, (DRISDOL) 1.25 MG (50000 UNIT) CAPS capsule; Take 1 capsule (50,000 Units total) by mouth every 7 (seven) days.     I  have personally reviewed and noted the following in the patient's chart:   Medical and social history Use of alcohol, tobacco or illicit drugs  Current medications and supplements Functional ability and status Nutritional status Physical activity Advanced directives List of other physicians Hospitalizations, surgeries, and ER visits in previous 12 months Vitals Screenings to include cognitive, depression, and falls Referrals and appointments  In addition, I have reviewed and discussed with patient certain preventive protocols, quality metrics, and best practice recommendations. A written personalized care plan for preventive services as well as general preventive health recommendations were provided to patient.    Claiborne Rigg, NP 04/18/2022

## 2022-04-19 LAB — T3, FREE: T3, Free: 2.4 pg/mL (ref 2.0–4.4)

## 2022-04-19 LAB — TSH: TSH: 0.586 u[IU]/mL (ref 0.450–4.500)

## 2022-04-19 LAB — T4, FREE: Free T4: 1.42 ng/dL (ref 0.82–1.77)

## 2022-04-19 LAB — VITAMIN D 25 HYDROXY (VIT D DEFICIENCY, FRACTURES): Vit D, 25-Hydroxy: 8.3 ng/mL — ABNORMAL LOW (ref 30.0–100.0)

## 2022-04-21 ENCOUNTER — Other Ambulatory Visit (HOSPITAL_COMMUNITY): Payer: Self-pay

## 2022-05-05 ENCOUNTER — Encounter: Payer: Medicare Other | Attending: Registered Nurse | Admitting: Psychology

## 2022-05-05 DIAGNOSIS — S069X9S Unspecified intracranial injury with loss of consciousness of unspecified duration, sequela: Secondary | ICD-10-CM | POA: Insufficient documentation

## 2022-05-05 DIAGNOSIS — R4689 Other symptoms and signs involving appearance and behavior: Secondary | ICD-10-CM | POA: Diagnosis present

## 2022-05-05 DIAGNOSIS — F329 Major depressive disorder, single episode, unspecified: Secondary | ICD-10-CM | POA: Diagnosis present

## 2022-05-05 DIAGNOSIS — F4310 Post-traumatic stress disorder, unspecified: Secondary | ICD-10-CM | POA: Diagnosis present

## 2022-05-05 DIAGNOSIS — S069X0S Unspecified intracranial injury without loss of consciousness, sequela: Secondary | ICD-10-CM | POA: Insufficient documentation

## 2022-05-06 ENCOUNTER — Ambulatory Visit: Payer: Self-pay | Admitting: *Deleted

## 2022-05-06 NOTE — Telephone Encounter (Signed)
  Chief Complaint: sharp pain or sting in right top area of hand on Saturday and started bleeding. Symptoms: pain in right hand top of hand close to thumb. Reports just felt a "poke" and started to bleed. No bleeding now and denies redness or drainage from site . Frequency: Saturday 05/03/22 Pertinent Negatives: Patient denies bleeding now. No drainage from site. No swelling no redness. Disposition: [] ED /[x] Urgent Care (no appt availability in office) / [] Appointment(In office/virtual)/ []  [facility]/ [] Home Care/ [] Refused Recommended Disposition /[] [place] [facility]/ []  Follow-up with PCP Additional Notes:   No available appt. Recommended UC due to pain. Please advise .    Reason for Disposition  [1] MODERATE pain (e.g., interferes with normal activities) AND [2] present > 3 days  Answer Assessment - Initial Assessment Questions 1. ONSET: "When did the pain start?"     Saturday  2. LOCATION: "Where is the pain located?"     Right  hand , top of hand close to thumb 3. PAIN: "How bad is the pain?" (Scale 1-10; or mild, moderate, severe)   - MILD (1-3): doesn't interfere with normal activities   - MODERATE (4-7): interferes with normal activities (e.g., work or school) or awakens from sleep   - SEVERE (8-10): excruciating pain, unable to use hand at all     Saturday felt like sharp sting to right hand  4. WORK OR EXERCISE: "Has there been any recent work or exercise that involved this part (i.e., hand or wrist) of the body?"     no 5. CAUSE: "What do you think is causing the pain?"     Not sure  if he was stung by insect or someone stuck something in his hand. Was "drinking" at time of injury 6. AGGRAVATING FACTORS: "What makes the pain worse?" (e.g., using computer)     Na  7. OTHER SYMPTOMS: "Do you have any other symptoms?" (e.g., neck pain, swelling, rash, numbness, fever)     Looks like tip of pin in size feels like getting "poked" and then started bleeding . No  bleeding now  8. PREGNANCY: "Is there any chance you are pregnant?" "When was your last menstrual period?"     na  Protocols used: Hand and Wrist Pain-A-AH

## 2022-05-07 NOTE — Telephone Encounter (Signed)
FYI

## 2022-05-11 ENCOUNTER — Encounter: Payer: Self-pay | Admitting: Psychology
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
## 2022-06-04 ENCOUNTER — Encounter: Payer: Medicare Other | Admitting: Physical Medicine & Rehabilitation

## 2022-08-27 ENCOUNTER — Encounter: Payer: Self-pay | Admitting: Physical Medicine & Rehabilitation

## 2022-08-27 ENCOUNTER — Other Ambulatory Visit (HOSPITAL_COMMUNITY): Payer: Self-pay

## 2022-08-27 ENCOUNTER — Encounter: Payer: Medicare Other | Attending: Physical Medicine & Rehabilitation | Admitting: Physical Medicine & Rehabilitation

## 2022-08-27 VITALS — BP 126/88 | HR 89 | Ht 70.0 in | Wt 141.0 lb

## 2022-08-27 DIAGNOSIS — R4689 Other symptoms and signs involving appearance and behavior: Secondary | ICD-10-CM | POA: Diagnosis present

## 2022-08-27 DIAGNOSIS — Z5181 Encounter for therapeutic drug level monitoring: Secondary | ICD-10-CM | POA: Diagnosis present

## 2022-08-27 DIAGNOSIS — F329 Major depressive disorder, single episode, unspecified: Secondary | ICD-10-CM | POA: Insufficient documentation

## 2022-08-27 DIAGNOSIS — S069X9S Unspecified intracranial injury with loss of consciousness of unspecified duration, sequela: Secondary | ICD-10-CM | POA: Insufficient documentation

## 2022-08-27 DIAGNOSIS — S069X0S Unspecified intracranial injury without loss of consciousness, sequela: Secondary | ICD-10-CM | POA: Diagnosis present

## 2022-08-27 MED ORDER — CITALOPRAM HYDROBROMIDE 20 MG PO TABS
60.0000 mg | ORAL_TABLET | Freq: Every day | ORAL | 6 refills | Status: AC
  Filled 2022-08-27: qty 90, 30d supply, fill #0

## 2022-08-27 MED ORDER — DIVALPROEX SODIUM 500 MG PO DR TAB
500.0000 mg | DELAYED_RELEASE_TABLET | Freq: Three times a day (TID) | ORAL | 6 refills | Status: DC
  Filled 2022-08-27: qty 90, 30d supply, fill #0

## 2022-08-27 MED ORDER — QUETIAPINE FUMARATE ER 300 MG PO TB24
300.0000 mg | ORAL_TABLET | Freq: Every day | ORAL | 6 refills | Status: AC
Start: 2022-08-27 — End: ?
  Filled 2022-08-27: qty 30, 30d supply, fill #0
  Filled 2022-10-21: qty 30, 30d supply, fill #1

## 2022-08-27 NOTE — Patient Instructions (Signed)
ALWAYS FEEL FREE TO CALL OUR OFFICE WITH ANY PROBLEMS OR QUESTIONS [phone])  **PLEASE NOTE** ALL MEDICATION REFILL REQUESTS (INCLUDING CONTROLLED SUBSTANCES) NEED TO BE MADE AT LEAST 7 DAYS PRIOR TO REFILL BEING DUE. ANY REFILL REQUESTS INSIDE THAT TIME FRAME MAY RESULT IN DELAYS IN RECEIVING YOUR PRESCRIPTION.                     GO TO [facility] HERE IN OUR BUILDING OR ANOTHER LOCATION ON 09/23/22, SOMETIME IN MORNING BEFORE YOU TAKE YOUR MEDS. [facility] ARE CHECKING DRUG LEVELS AND CHECKING OTHER LABS.

## 2022-08-27 NOTE — Progress Notes (Signed)
Subjective:    Patient ID: Gary Ashley, male    DOB: 03-01-1981, 41 y.o.   MRN: [id number]  HPI Gary Ashley is here in follow up of his TBI. He still has some mood swings at times although they're better. He doesn't know what is triggering them. Perhaps headaches and pain can trigger the behavior. Hallucinations and delusions are less as a whole.  He ran out of depakote a couple days ago. He is also on seroquel at night time. He says he's taking these meds as recommended. He also feels depressed at times. He remains on celexa at bedtime 40mg .   His pcp writes hydrocodone and gabapentin although he's not completely sure what the gabapentin is for.       Pain Inventory Average Pain 8 Pain Right Now 6 My pain is constant  LOCATION OF PAIN  pain in both legs, right side of head pain  BOWEL Number of stools per week: 7 plus  BLADDER Normal    Mobility ability to climb steps?  yes do you drive?  no  Function disabled: date disabled 2020 Do you have any goals in this area?  yes  Neuro/Psych weakness numbness tingling trouble walking dizziness confusion depression  Prior Studies Any changes since last visit?  no  Physicians involved in your care Any changes since last visit?  no   Family History  Problem Relation Age of Onset   Healthy Mother    Healthy Father    Colon cancer Neg Hx    Stomach cancer Neg Hx    Esophageal cancer Neg Hx    Colon polyps Neg Hx    Social History   Socioeconomic History   Marital status: Single    Spouse name: Not on file   Number of children: 4   Years of education: Not on file   Highest education level: Not on file  Occupational History   Occupation: SSI  Tobacco Use   Smoking status: Some Days    Types: Cigarettes   Smokeless tobacco: Never  Vaping Use   Vaping Use: Never used  Substance and Sexual Activity   Alcohol use: Not Currently   Drug use: Yes    Types: Marijuana    Comment: sometimes   Sexual  activity: Yes  Other Topics Concern   Not on file  Social History Narrative   Not on file   Social Determinants of Health   Financial Resource Strain: Low Risk  (04/18/2022)   Overall Financial Resource Strain (CARDIA)    Difficulty of Paying Living Expenses: Not very hard  Food Insecurity: Food Insecurity Present (04/18/2022)   Hunger Vital Sign    Worried About Running Out of Food in the Last Year: Never true    Ran Out of Food in the Last Year: Sometimes true  Transportation Needs: Not on file  Physical Activity: Not on file  Stress: No Stress Concern Present (04/18/2022)   06/18/2022 of Occupational Health - Occupational Stress Questionnaire    Feeling of Stress : Not at all  Social Connections: Not on file   Past Surgical History:  Procedure Laterality Date   APPENDECTOMY     EXTERNAL FIXATION LEG Bilateral 02/18/2019   Procedure: EXTERNAL FIXATION RIGHT LEG, I&D LEFT LEG;  Surgeon: 04/20/2019, MD;  Location: [facility];  Service: Orthopedics;  Laterality: Bilateral;   KNEE ARTHROSCOPY WITH LATERAL MENISECTOMY Right 01/10/2021   Procedure: RIGHT KNEE ARTHROSCOPY WITH LATERAL MENISECTOMY DEBRIDEMENT CHONDROPLASTY  LYSIS OF ADHESIONS WITH  MANIPULATION;  Surgeon: Bjorn Pippin, MD;  Location: [facility];  Service: Orthopedics;  Laterality: Right;   ORIF TIBIA PLATEAU Right 02/21/2019   Procedure: OPEN REDUCTION INTERNAL FIXATION (ORIF) TIBIAL PLATEAU;  Surgeon: Roby Lofts, MD;  Location: [facility];  Service: Orthopedics;  Laterality: Right;   Past Medical History:  Diagnosis Date   Anxiety    Bilateral pneumothorax    C7 cervical fracture (HCC)    Depression    Hypertension    SAH (subarachnoid hemorrhage) (HCC)    Sinus tachycardia    TBI (traumatic brain injury) (HCC)    Vitamin D deficiency    Ht 5\' 10"  (1.778 m)   Wt 141 lb (64 kg)   BMI 20.23 kg/m   Opioid Risk Score:   Fall Risk Score:  `1  Depression screen PHQ 2/9     08/27/2022    2:14 PM  04/18/2022    1:53 PM 01/29/2022   10:41 AM 11/20/2021    3:36 PM 10/16/2021   10:33 AM 07/01/2021    9:08 AM 05/29/2021    1:41 PM  Depression screen PHQ 2/9  Decreased Interest 1 2 0 2 1 0 1  Down, Depressed, Hopeless 1 3 2 3 1 1 1   PHQ - 2 Score 2 5 2 5 2 1 2   Altered sleeping  2  2     Tired, decreased energy  2  2     Change in appetite  2  0     Feeling bad or failure about yourself   1  2     Trouble concentrating  2  2     Moving slowly or fidgety/restless  2  2     Suicidal thoughts  2  1     PHQ-9 Score  18  16     Difficult doing work/chores    Somewhat difficult       Review of Systems  Musculoskeletal:  Positive for gait problem.  Neurological:  Positive for dizziness, weakness and numbness.       Tingling  Hematological:        Depression  Psychiatric/Behavioral:  Positive for confusion.   All other systems reviewed and are negative.      Objective:   Physical Exam  General: No acute distress HEENT: NCAT, EOMI, oral membranes moist Cards: reg rate  Chest: normal effort Abdomen: Soft, NT, ND Skin: dry, intact Extremities: no edema Psych: pleasant and appropriate. No hallucinations    Neuro:  attention and concentration/stable.  reasonable insight and awareness.  No cn findings. Balance reaosnable with gait. Strength 4-5/5.  Musculoskeletal: still some antalgia RLE       Assessment & Plan:  1. Multiple trauma with TBI                                                                                            -remains  non-vocational   2. Closed Bicondylar Fracture of Left/Right Tibial Plateau -edema mgt as advised per orthopedics  -Diclofenac for pain--with food -does he need gabapentin? He will check with PCP 3. Cervical Fracture (  C7): cleared from a NS standpoing 4. Insomnia,auditory hallucinations-TBI behavioral changes. PTSD sx as well with nightmares and vivid dreams.  -  Seroquel   300 mg XR at bedtime -prazosin for nightmares and  sweating -increase Celexa to 600 mg qhs for depression and agitation -increaseDepakote to 500mg  tid. Check CMET in one week.   5. Continue f/u with  Dr. for   his mood and behaviors.             -      Fifteen minutes of face to face patient care time were spent during this visit. All questions were encouraged and answered.  Follow up with me in 3 mos .

## 2022-08-28 ENCOUNTER — Other Ambulatory Visit: Payer: Self-pay

## 2022-09-23 ENCOUNTER — Other Ambulatory Visit: Payer: Self-pay | Admitting: Physical Medicine & Rehabilitation

## 2022-09-24 ENCOUNTER — Telehealth (HOSPITAL_COMMUNITY): Payer: Self-pay | Admitting: Physical Medicine & Rehabilitation

## 2022-09-24 LAB — COMPREHENSIVE METABOLIC PANEL
ALT: 10 IU/L (ref 0–44)
AST: 20 IU/L (ref 0–40)
Albumin/Globulin Ratio: 1.5 (ref 1.2–2.2)
Albumin: 4.1 g/dL (ref 4.1–5.1)
Alkaline Phosphatase: 53 IU/L (ref 44–121)
BUN/Creatinine Ratio: 12 (ref 9–20)
BUN: 11 mg/dL (ref 6–24)
Bilirubin Total: <0.2 mg/dL (ref 0.0–1.2)
CO2: 26 mmol/L (ref 20–29)
Calcium: 8.9 mg/dL (ref 8.7–10.2)
Chloride: 103 mmol/L (ref 96–106)
Creatinine, Ser: 0.94 mg/dL (ref 0.76–1.27)
Globulin, Total: 2.8 g/dL (ref 1.5–4.5)
Glucose: 92 mg/dL (ref 70–99)
Potassium: 4.3 mmol/L (ref 3.5–5.2)
Sodium: 142 mmol/L (ref 134–144)
Total Protein: 6.9 g/dL (ref 6.0–8.5)
eGFR: 104 mL/min/{1.73_m2} (ref >59)

## 2022-09-24 LAB — VALPROIC ACID LEVEL: Valproic Acid Lvl: 41 ug/mL — ABNORMAL LOW (ref 50–100)

## 2022-09-24 NOTE — Telephone Encounter (Signed)
Please let Gary Ashley know that I reviewed his labs and they all look ok. No changes to med plan.  Thanks!

## 2022-09-25 NOTE — Telephone Encounter (Signed)
Pt.notified

## 2022-10-07 ENCOUNTER — Telehealth: Payer: Self-pay

## 2022-10-07 DIAGNOSIS — M2550 Pain in unspecified joint: Secondary | ICD-10-CM

## 2022-10-07 NOTE — Telephone Encounter (Signed)
Gary Ashley is requesting an Rx for a walking cane. If granted please send the Rx to [facility].    Gary Ashley will call back  tomorrow with the [facility] fax number.  Call back phone [phone].

## 2022-10-08 NOTE — Telephone Encounter (Signed)
Order placed for single point cane.

## 2022-10-10 ENCOUNTER — Ambulatory Visit: Payer: 59 | Attending: Family Medicine

## 2022-10-10 DIAGNOSIS — Z23 Encounter for immunization: Secondary | ICD-10-CM | POA: Insufficient documentation

## 2022-10-10 NOTE — Progress Notes (Signed)
Flu vaccine administered per protocols.  In right deltoid  Information sheet given. Patient denies and pain or discomfort at injection site. Tolerated injection well no reaction.

## 2022-10-10 NOTE — Telephone Encounter (Signed)
Patient has been informed the order has been placed.

## 2022-10-20 ENCOUNTER — Encounter: Payer: Self-pay | Admitting: Nurse Practitioner

## 2022-10-20 ENCOUNTER — Other Ambulatory Visit (HOSPITAL_COMMUNITY): Payer: Self-pay

## 2022-10-20 ENCOUNTER — Ambulatory Visit: Payer: 59 | Attending: Nurse Practitioner | Admitting: Nurse Practitioner

## 2022-10-20 ENCOUNTER — Other Ambulatory Visit: Payer: Self-pay

## 2022-10-20 VITALS — BP 134/73 | HR 73 | Wt 139.6 lb

## 2022-10-20 DIAGNOSIS — Z0000 Encounter for general adult medical examination without abnormal findings: Secondary | ICD-10-CM

## 2022-10-20 DIAGNOSIS — E559 Vitamin D deficiency, unspecified: Secondary | ICD-10-CM | POA: Diagnosis not present

## 2022-10-20 DIAGNOSIS — I10 Essential (primary) hypertension: Secondary | ICD-10-CM | POA: Diagnosis not present

## 2022-10-20 MED ORDER — VITAMIN D (ERGOCALCIFEROL) 1.25 MG (50000 UNIT) PO CAPS
50000.0000 [IU] | ORAL_CAPSULE | ORAL | 1 refills | Status: DC
Start: 2022-10-20 — End: 2023-06-02
  Filled 2022-10-20: qty 12, 84d supply, fill #0

## 2022-10-20 NOTE — Patient Instructions (Signed)
[address], [place] [address] Phone: [phone]  Fax: [phone] DEA #: [id number]

## 2022-10-20 NOTE — Progress Notes (Signed)
No concerns. 

## 2022-10-20 NOTE — Progress Notes (Signed)
Assessment & Plan:  Gary Ashley was seen today for hypertension and annual exam.  Diagnoses and all orders for this visit:  Encounter for annual physical exam  Primary hypertension Continue all antihypertensives as prescribed.  Reminded to bring in blood pressure log for follow  up appointment.  RECOMMENDATIONS: DASH/Mediterranean Diets are healthier choices for HTN.     Vitamin D deficiency disease -     Vitamin D, Ergocalciferol, (DRISDOL) 1.25 MG (50000 UNIT) CAPS capsule; Take 1 capsule (50,000 Units total) by mouth every 7 (seven) days.    Patient has been counseled on age-appropriate routine health concerns for screening and prevention. These are reviewed and up-to-date. Referrals have been placed accordingly. Immunizations are up-to-date or declined.    Subjective:   Chief Complaint  Patient presents with   Hypertension   Annual Exam   HPI Gary Ashley 42 y.o. male presents to office today for annual physical exam.  He has a past medical history of Anxiety, Bilateral pneumothorax, C7 cervical fracture, Depression (taking celexa and seroquel), Hypertension, SAH, Sinus tachycardia, TBI with associated cognitive and behavioral deficits (Taking depakote), and Vitamin D deficiency.    Blood pressure is well-controlled. He is currently prescribed Lopressor 12.5 mg twice daily. BP Readings from Last 3 Encounters:  10/20/22 134/73  08/27/22 126/88  04/18/22 (!) 144/96     Review of Systems  Constitutional:  Negative for fever, malaise/fatigue and weight loss.  HENT: Negative.  Negative for nosebleeds.   Eyes: Negative.  Negative for blurred vision, double vision and photophobia.  Respiratory: Negative.  Negative for cough and shortness of breath.   Cardiovascular: Negative.  Negative for chest pain, palpitations and leg swelling.  Gastrointestinal: Negative.  Negative for heartburn, nausea and vomiting.  Genitourinary: Negative.   Musculoskeletal: Negative.   Negative for myalgias.  Skin: Negative.   Neurological: Negative.  Negative for dizziness, focal weakness, seizures and headaches.  Endo/Heme/Allergies: Negative.   Psychiatric/Behavioral: Negative.  Negative for suicidal ideas.     Past Medical History:  Diagnosis Date   Anxiety    Bilateral pneumothorax    C7 cervical fracture (HCC)    Depression    Hypertension    SAH (subarachnoid hemorrhage) (HCC)    Sinus tachycardia    TBI (traumatic brain injury) ([place])    Vitamin D deficiency     Past Surgical History:  Procedure Laterality Date   APPENDECTOMY     EXTERNAL FIXATION LEG Bilateral 02/18/2019   Procedure: EXTERNAL FIXATION RIGHT LEG, I&D LEFT LEG;  Surgeon: Shona Needles, MD;  Location: [place];  Service: Orthopedics;  Laterality: Bilateral;   KNEE ARTHROSCOPY WITH LATERAL MENISECTOMY Right 01/10/2021   Procedure: RIGHT KNEE ARTHROSCOPY WITH LATERAL MENISECTOMY DEBRIDEMENT CHONDROPLASTY  LYSIS OF ADHESIONS WITH MANIPULATION;  Surgeon: Hiram Gash, MD;  Location: [place];  Service: Orthopedics;  Laterality: Right;   ORIF TIBIA PLATEAU Right 02/21/2019   Procedure: OPEN REDUCTION INTERNAL FIXATION (ORIF) TIBIAL PLATEAU;  Surgeon: Shona Needles, MD;  Location: [place];  Service: Orthopedics;  Laterality: Right;    Family History  Problem Relation Age of Onset   Healthy Mother    Healthy Father    Colon cancer Neg Hx    Stomach cancer Neg Hx    Esophageal cancer Neg Hx    Colon polyps Neg Hx     Social History Reviewed with no changes to be made today.   Outpatient Medications Prior to Visit  Medication Sig Dispense Refill  chlorhexidine (PERIDEX) 0.12 % solution Rinse mouth with 15 mls 2 times daily as directed. Do not swallow 946 mL 3   citalopram (CELEXA) 20 MG tablet Take 3 tablets (60 mg total) by mouth at bedtime. 90 tablet 6   dicyclomine (BENTYL) 10 MG capsule Take 1 capsule (10 mg total) by mouth every 6 (six) hours as needed (cramps/diarrhea).  45 capsule 0   divalproex (DEPAKOTE) 500 MG DR tablet Take 1 tablet (500 mg total) by mouth 3 (three) times daily. 90 tablet 6   gabapentin (NEURONTIN) 300 MG capsule Take 1 capsule (300 mg total) by mouth 3 (three) times daily. 90 capsule 0   HYDROcodone-acetaminophen (NORCO/VICODIN) 5-325 MG tablet Take 1 tablet by mouth every 4-6 hours as needed for pain 12 tablet 0   methocarbamol (ROBAXIN) 500 MG tablet Take 1-2 tablets (500-1,000 mg total) by mouth 4 (four) times daily. 150 tablet 1   metoprolol tartrate (LOPRESSOR) 25 MG tablet Take 0.5 tablets (12.5 mg total) by mouth 2 (two) times daily. 30 tablet 1   prazosin (MINIPRESS) 2 MG capsule Take 1 capsule by mouth at bedtime. 30 capsule 5   QUEtiapine (SEROQUEL XR) 300 MG 24 hr tablet Take 1 tablet (300 mg total) by mouth at bedtime. 30 tablet 6   clindamycin (CLEOCIN) 300 MG capsule Take 1 capsule by mouth 3 times daily. 21 capsule 0   diclofenac (VOLTAREN) 75 MG EC tablet Take 1 tablet (75 mg total) by mouth 2 (two) times daily. For pain/inflammation 60 tablet 0   Vitamin D, Ergocalciferol, (DRISDOL) 1.25 MG (50000 UNIT) CAPS capsule Take 1 capsule (50,000 Units total) by mouth every 7 (seven) days. 12 capsule 1   No facility-administered medications prior to visit.    Allergies  Allergen Reactions   Penicillins     Swelling Tolerated Cephalosporin 02/22/2019         Objective:    BP 134/73   Pulse 73   Wt 139 lb 9.6 oz (63.3 kg)   SpO2 96%   BMI 20.03 kg/m  Wt Readings from Last 3 Encounters:  10/20/22 139 lb 9.6 oz (63.3 kg)  08/27/22 141 lb (64 kg)  04/18/22 127 lb 6.4 oz (57.8 kg)    Physical Exam Constitutional:      Appearance: He is well-developed.  HENT:     Head: Normocephalic and atraumatic.     Right Ear: Hearing, tympanic membrane, ear canal and external ear normal.     Left Ear: Hearing, tympanic membrane, ear canal and external ear normal.     Nose: Nose normal. No mucosal edema or rhinorrhea.     Right  Turbinates: Not enlarged.     Left Turbinates: Not enlarged.     Mouth/Throat:     Lips: Pink.     Mouth: Mucous membranes are moist.     Dentition: No gingival swelling, dental abscesses or gum lesions.     Pharynx: Uvula midline.     Tonsils: No tonsillar exudate. 1+ on the right. 1+ on the left.  Eyes:     General: Lids are normal. No scleral icterus.    Extraocular Movements: Extraocular movements intact.     Conjunctiva/sclera: Conjunctivae normal.     Pupils: Pupils are equal, round, and reactive to light.  Neck:     Thyroid: No thyromegaly.     Trachea: No tracheal deviation.  Cardiovascular:     Rate and Rhythm: Normal rate and regular rhythm.     Heart sounds: Normal heart  sounds. No murmur heard.    No friction rub. No gallop.  Pulmonary:     Effort: Pulmonary effort is normal. No respiratory distress.     Breath sounds: Normal breath sounds. No wheezing or rales.  Chest:     Chest wall: No mass or tenderness.  Breasts:    Right: No inverted nipple, mass, nipple discharge, skin change or tenderness.     Left: No inverted nipple, mass, nipple discharge, skin change or tenderness.  Abdominal:     General: Bowel sounds are normal. There is no distension.     Palpations: Abdomen is soft. There is no mass.     Tenderness: There is no abdominal tenderness. There is no guarding or rebound.  Musculoskeletal:        General: No tenderness or deformity. Normal range of motion.     Cervical back: Normal range of motion and neck supple.  Lymphadenopathy:     Cervical: No cervical adenopathy.  Skin:    General: Skin is warm and dry.     Capillary Refill: Capillary refill takes less than 2 seconds.     Findings: No erythema.  Neurological:     Mental Status: He is alert and oriented to person, place, and time.     Cranial Nerves: No cranial nerve deficit.     Sensory: Sensation is intact.     Motor: No abnormal muscle tone.     Coordination: Coordination is intact.  Coordination normal.     Gait: Gait is intact.     Deep Tendon Reflexes: Reflexes normal.     Reflex Scores:      Patellar reflexes are 1+ on the right side and 1+ on the left side. Psychiatric:        Attention and Perception: Attention normal.        Mood and Affect: Mood normal.        Speech: Speech normal.        Behavior: Behavior normal.        Thought Content: Thought content normal.        Judgment: Judgment normal.          Patient has been counseled extensively about nutrition and exercise as well as the importance of adherence with medications and regular follow-up. The patient was given clear instructions to go to ER or return to medical center if symptoms don't improve, worsen or new problems develop. The patient verbalized understanding.   Follow-up: Return in about 6 months (around 04/20/2023).   Gildardo Pounds, FNP-BC [facility] [place], [place]   10/20/2022, 2:09 PM

## 2022-10-21 ENCOUNTER — Encounter: Payer: Self-pay | Admitting: Physical Medicine & Rehabilitation

## 2022-10-21 ENCOUNTER — Encounter (HOSPITAL_COMMUNITY): Payer: Self-pay

## 2022-10-21 ENCOUNTER — Other Ambulatory Visit (HOSPITAL_COMMUNITY): Payer: Self-pay

## 2022-10-22 ENCOUNTER — Other Ambulatory Visit: Payer: Self-pay

## 2022-10-22 ENCOUNTER — Encounter: Payer: 59 | Admitting: Psychology

## 2022-10-23 ENCOUNTER — Other Ambulatory Visit: Payer: Self-pay

## 2022-11-05 ENCOUNTER — Other Ambulatory Visit (HOSPITAL_COMMUNITY): Payer: Self-pay

## 2022-11-05 ENCOUNTER — Encounter: Payer: Self-pay | Admitting: Physical Medicine & Rehabilitation

## 2022-11-05 ENCOUNTER — Other Ambulatory Visit: Payer: Self-pay

## 2022-11-05 ENCOUNTER — Encounter: Payer: 59 | Attending: Physical Medicine & Rehabilitation | Admitting: Physical Medicine & Rehabilitation

## 2022-11-05 VITALS — BP 136/92 | HR 74 | Ht 70.0 in | Wt 150.2 lb

## 2022-11-05 DIAGNOSIS — S069X0S Unspecified intracranial injury without loss of consciousness, sequela: Secondary | ICD-10-CM

## 2022-11-05 DIAGNOSIS — R4689 Other symptoms and signs involving appearance and behavior: Secondary | ICD-10-CM | POA: Insufficient documentation

## 2022-11-05 DIAGNOSIS — G894 Chronic pain syndrome: Secondary | ICD-10-CM | POA: Diagnosis present

## 2022-11-05 DIAGNOSIS — S82141A Displaced bicondylar fracture of right tibia, initial encounter for closed fracture: Secondary | ICD-10-CM | POA: Diagnosis present

## 2022-11-05 DIAGNOSIS — S069X9S Unspecified intracranial injury with loss of consciousness of unspecified duration, sequela: Secondary | ICD-10-CM | POA: Diagnosis present

## 2022-11-05 DIAGNOSIS — G8918 Other acute postprocedural pain: Secondary | ICD-10-CM

## 2022-11-05 MED ORDER — GABAPENTIN 600 MG PO TABS
600.0000 mg | ORAL_TABLET | Freq: Three times a day (TID) | ORAL | 3 refills | Status: AC
  Filled 2022-11-05: qty 90, 30d supply, fill #0

## 2022-11-05 MED ORDER — DICLOFENAC SODIUM 75 MG PO TBEC
75.0000 mg | DELAYED_RELEASE_TABLET | Freq: Two times a day (BID) | ORAL | 3 refills | Status: DC
  Filled 2022-11-05: qty 60, 30d supply, fill #0

## 2022-11-05 NOTE — Patient Instructions (Signed)
ALWAYS FEEL FREE TO CALL OUR OFFICE WITH ANY PROBLEMS OR QUESTIONS [phone])  **PLEASE NOTE** ALL MEDICATION REFILL REQUESTS (INCLUDING CONTROLLED SUBSTANCES) NEED TO BE MADE AT LEAST 7 DAYS PRIOR TO REFILL BEING DUE. ANY REFILL REQUESTS INSIDE THAT TIME FRAME MAY RESULT IN DELAYS IN RECEIVING YOUR PRESCRIPTION.    GABAPENTIN 600-300-600 FOR 4 DAYS, THEN 600MG THREE X DAILY   DICLOFENAC 75MG TWICE DAILY

## 2022-11-05 NOTE — Progress Notes (Signed)
Subjective:    Patient ID: Gary Ashley, male    DOB: 1980-11-04, 42 y.o.   MRN: [id number]  HPI  [name] returns today in follow-up of his traumatic brain injury and associated cognitive deficits.  Apparently his family physician is no longer interested in writing his pain medications and he is here for assessment of that today.  He has ongoing pain in his right knee. It worsens when he's up for longer periods of time. He uses tylenol over the counter with some benefit. He says he smokes marijuana occasionally when pain is more severe. The pain can get him worked up and anxious when it's more severe.   He remains on seroquel, gabapentin and depakote for his mood staiblization. He also takes gabapentin for pain.     Pain Inventory Average Pain 7 Pain Right Now 8 My pain is sharp, burning, stabbing, and aching  In the last 24 hours, has pain interfered with the following? General activity 9 Relation with others 8 Enjoyment of life 8 What TIME of day is your pain at its worst? daytime and evening Sleep (in general) Fair  Pain is worse with: walking, sitting, standing, and some activites Pain improves with: rest, therapy/exercise, and medication Relief from Meds: 4  Family History  Problem Relation Age of Onset   Healthy Mother    Healthy Father    Colon cancer Neg Hx    Stomach cancer Neg Hx    Esophageal cancer Neg Hx    Colon polyps Neg Hx    Social History   Socioeconomic History   Marital status: Single    Spouse name: Not on file   Number of children: 4   Years of education: Not on file   Highest education level: Not on file  Occupational History   Occupation: SSI  Tobacco Use   Smoking status: Some Days    Types: Cigarettes   Smokeless tobacco: Never  Vaping Use   Vaping Use: Never used  Substance and Sexual Activity   Alcohol use: Not Currently   Drug use: Yes    Types: Marijuana    Comment: sometimes   Sexual activity: Yes  Other Topics  Concern   Not on file  Social History Narrative   Not on file   Social Determinants of Health   Financial Resource Strain: Low Risk  (04/18/2022)   Overall Financial Resource Strain (CARDIA)    Difficulty of Paying Living Expenses: Not very hard  Food Insecurity: Food Insecurity Present (04/18/2022)   Hunger Vital Sign    Worried About Running Out of Food in the Last Year: Never true    Ran Out of Food in the Last Year: Sometimes true  Transportation Needs: Not on file  Physical Activity: Not on file  Stress: No Stress Concern Present (04/18/2022)   [place]    Feeling of Stress : Not at all  Social Connections: Not on file   Past Surgical History:  Procedure Laterality Date   APPENDECTOMY     EXTERNAL FIXATION LEG Bilateral 02/18/2019   Procedure: EXTERNAL FIXATION RIGHT LEG, I&D LEFT LEG;  Surgeon: Shona Needles, MD;  Location: [place];  Service: Orthopedics;  Laterality: Bilateral;   KNEE ARTHROSCOPY WITH LATERAL MENISECTOMY Right 01/10/2021   Procedure: RIGHT KNEE ARTHROSCOPY WITH LATERAL MENISECTOMY DEBRIDEMENT CHONDROPLASTY  LYSIS OF ADHESIONS WITH MANIPULATION;  Surgeon: Hiram Gash, MD;  Location: [place];  Service: Orthopedics;  Laterality:  Right;   ORIF TIBIA PLATEAU Right 02/21/2019   Procedure: OPEN REDUCTION INTERNAL FIXATION (ORIF) TIBIAL PLATEAU;  Surgeon: Shona Needles, MD;  Location: [place];  Service: Orthopedics;  Laterality: Right;   Past Surgical History:  Procedure Laterality Date   APPENDECTOMY     EXTERNAL FIXATION LEG Bilateral 02/18/2019   Procedure: EXTERNAL FIXATION RIGHT LEG, I&D LEFT LEG;  Surgeon: Shona Needles, MD;  Location: [place];  Service: Orthopedics;  Laterality: Bilateral;   KNEE ARTHROSCOPY WITH LATERAL MENISECTOMY Right 01/10/2021   Procedure: RIGHT KNEE ARTHROSCOPY WITH LATERAL MENISECTOMY DEBRIDEMENT CHONDROPLASTY  LYSIS OF ADHESIONS WITH MANIPULATION;  Surgeon:  Hiram Gash, MD;  Location: [place];  Service: Orthopedics;  Laterality: Right;   ORIF TIBIA PLATEAU Right 02/21/2019   Procedure: OPEN REDUCTION INTERNAL FIXATION (ORIF) TIBIAL PLATEAU;  Surgeon: Shona Needles, MD;  Location: [place];  Service: Orthopedics;  Laterality: Right;   Past Medical History:  Diagnosis Date   Anxiety    Bilateral pneumothorax    C7 cervical fracture (HCC)    Depression    Hypertension    SAH (subarachnoid hemorrhage) (HCC)    Sinus tachycardia    TBI (traumatic brain injury) ([place])    Vitamin D deficiency    BP (!) 136/92   Pulse 74   Ht 5' 10"$  (1.778 m)   Wt 150 lb 3.2 oz (68.1 kg)   SpO2 98%   BMI 21.55 kg/m   Opioid Risk Score:   Fall Risk Score:  `1  Depression screen PHQ 2/9     08/27/2022    2:14 PM 04/18/2022    1:53 PM 01/29/2022   10:41 AM 11/20/2021    3:36 PM 10/16/2021   10:33 AM 07/01/2021    9:08 AM 05/29/2021    1:41 PM  Depression screen PHQ 2/9  Decreased Interest 1 2 0 2 1 0 1  Down, Depressed, Hopeless 1 3 2 3 1 1 1  $ PHQ - 2 Score 2 5 2 5 2 1 2  $ Altered sleeping  2  2     Tired, decreased energy  2  2     Change in appetite  2  0     Feeling bad or failure about yourself   1  2     Trouble concentrating  2  2     Moving slowly or fidgety/restless  2  2     Suicidal thoughts  2  1     PHQ-9 Score  18  16     Difficult doing work/chores    Somewhat difficult        Review of Systems  Musculoskeletal:        Leg pain  All other systems reviewed and are negative.     Objective:   Physical Exam General: No acute distress HEENT: NCAT, EOMI, oral membranes moist Cards: reg rate  Chest: normal effort Abdomen: Soft, NT, ND Skin: dry, intact Extremities: no edema Psych: pleasant and appropriate. No hallucinations    Neuro:  attention and concentration/stable.  reasonable insight and awareness.  No cn findings. Balance reaosnable with gait. Strength 4-5/5.  Musculoskeletal: Ongoing antalgia with  weightbearing on the right lower extremity.  There is crepitus with range of motion of the right knee as well.  No obvious joint effusion appreciated.  Left knee with mild tenderness during range of motion.  Minimal antalgia to the left leg.     Assessment & Plan:  1.  Multiple trauma with TBI                                                                                            -remains  non-vocational   2. Closed Bicondylar Fracture of Left/Right Tibial Plateau -edema mgt as advised per orthopedics Resume diclofenac 75 mg p.o. twice daily -Will increase gabapentin to 600 mg 3 times daily for pain as well as to help his mood stabilization.  His mood does not help his pain especially when he becomes anxious. -We discussed the use of marijuana even with the medications he already is on in the setting of his traumatic brain injury 3. Cervical Fracture ( C7): cleared from a NS standpoing 4. Insomnia,auditory hallucinations-TBI behavioral changes. PTSD sx as well with nightmares and vivid dreams.  -  Seroquel   300 mg XR at bedtime -prazosin for nightmares and sweating -Continue Celexa 60 mg  qhs for depression and agitation - Depakote to 574m tid.  Recent valproic acid level and liver function tests are acceptable.  The valproic acid was a bit low but no changes will be made today..   5. Continue f/u with  Dr. RSima Matasfor   his mood and behaviors.             -      20 minutes of face to face patient care time were spent during this visit. All questions were encouraged and answered.  Follow up with me in 3 mos .

## 2022-11-15 ENCOUNTER — Other Ambulatory Visit (HOSPITAL_COMMUNITY): Payer: Self-pay

## 2022-11-26 ENCOUNTER — Encounter: Payer: Self-pay | Admitting: Physical Medicine & Rehabilitation

## 2022-11-26 ENCOUNTER — Encounter: Payer: 59 | Attending: Physical Medicine & Rehabilitation | Admitting: Physical Medicine & Rehabilitation

## 2022-11-26 ENCOUNTER — Other Ambulatory Visit (HOSPITAL_COMMUNITY): Payer: Self-pay

## 2022-11-26 VITALS — BP 141/95 | HR 73 | Ht 70.0 in | Wt 145.8 lb

## 2022-11-26 DIAGNOSIS — R4689 Other symptoms and signs involving appearance and behavior: Secondary | ICD-10-CM | POA: Insufficient documentation

## 2022-11-26 DIAGNOSIS — S069X0S Unspecified intracranial injury without loss of consciousness, sequela: Secondary | ICD-10-CM | POA: Insufficient documentation

## 2022-11-26 DIAGNOSIS — F4310 Post-traumatic stress disorder, unspecified: Secondary | ICD-10-CM | POA: Diagnosis not present

## 2022-11-26 DIAGNOSIS — M940 Chondrocostal junction syndrome [Tietze]: Secondary | ICD-10-CM | POA: Diagnosis not present

## 2022-11-26 DIAGNOSIS — S069X9S Unspecified intracranial injury with loss of consciousness of unspecified duration, sequela: Secondary | ICD-10-CM | POA: Insufficient documentation

## 2022-11-26 MED ORDER — DIVALPROEX SODIUM 500 MG PO DR TAB
500.0000 mg | DELAYED_RELEASE_TABLET | Freq: Three times a day (TID) | ORAL | 6 refills | Status: AC
  Filled 2022-11-26: qty 90, 30d supply, fill #0
  Filled 2023-01-13: qty 90, 30d supply, fill #1

## 2022-11-26 NOTE — Progress Notes (Signed)
Subjective:    Patient ID: Gary Ashley, male    DOB: Jul 19, 1981, 42 y.o.   MRN: [id number]  HPI  [name] is here in follow up of his TBI and polytrauma. We made some adjustments to his medications including his gabapentin and we resumed diclofenac.   He still has pain in his right leg but it's more tolerable.   From a sleep standpoint he's been improving also. Nightmares are decreased. He takes seroquel and prazosin at night which have helped control things. He also remains on vpa and celexa for his mood as well.   He has had a recent cold with a lot of coughing which has led to some chest wall pain on the left. It now hurts to turn or move on the left.   He smells of pot today but swears to me that it was his roommate who was smoking this morning. He says he does not use.    Pain Inventory Average Pain 7 Pain Right Now 8 My pain is sharp and stabbing  In the last 24 hours, has pain interfered with the following? General activity 5 Relation with others 5 Enjoyment of life 6 What TIME of day is your pain at its worst? morning , daytime, evening, and night Sleep (in general) Fair  Pain is worse with: bending and standing Pain improves with:  . Relief from Meds:  .  Family History  Problem Relation Age of Onset   Healthy Mother    Healthy Father    Colon cancer Neg Hx    Stomach cancer Neg Hx    Esophageal cancer Neg Hx    Colon polyps Neg Hx    Social History   Socioeconomic History   Marital status: Single    Spouse name: Not on file   Number of children: 4   Years of education: Not on file   Highest education level: Not on file  Occupational History   Occupation: [employer]  Tobacco Use   Smoking status: Some Days    Types: Cigarettes   Smokeless tobacco: Never  Vaping Use   Vaping Use: Never used  Substance and Sexual Activity   Alcohol use: Not Currently   Drug use: Yes    Types: Marijuana    Comment: sometimes   Sexual activity: Yes  Other Topics  Concern   Not on file  Social History Narrative   Not on file   Social Determinants of Health   Financial Resource Strain: Low Risk  (04/18/2022)   Overall Financial Resource Strain (CARDIA)    Difficulty of Paying Living Expenses: Not very hard  Food Insecurity: Food Insecurity Present (04/18/2022)   Hunger Vital Sign    Worried About Running Out of Food in the Last Year: Never true    Ran Out of Food in the Last Year: Sometimes true  Transportation Needs: Not on file  Physical Activity: Not on file  Stress: No Stress Concern Present (04/18/2022)   [place]    Feeling of Stress : Not at all  Social Connections: Not on file   Past Surgical History:  Procedure Laterality Date   APPENDECTOMY     EXTERNAL FIXATION LEG Bilateral 02/18/2019   Procedure: [facility], I&D LEFT LEG;  Surgeon: Shona Needles, MD;  Location: [place];  Service: Orthopedics;  Laterality: Bilateral;   KNEE ARTHROSCOPY WITH LATERAL MENISECTOMY Right 01/10/2021   Procedure: RIGHT KNEE ARTHROSCOPY WITH LATERAL MENISECTOMY  DEBRIDEMENT CHONDROPLASTY  LYSIS OF ADHESIONS WITH MANIPULATION;  Surgeon: Hiram Gash, MD;  Location: [place];  Service: Orthopedics;  Laterality: Right;   ORIF TIBIA PLATEAU Right 02/21/2019   Procedure: OPEN REDUCTION INTERNAL FIXATION (ORIF) TIBIAL PLATEAU;  Surgeon: Shona Needles, MD;  Location: [place];  Service: Orthopedics;  Laterality: Right;   Past Surgical History:  Procedure Laterality Date   APPENDECTOMY     EXTERNAL FIXATION LEG Bilateral 02/18/2019   Procedure: EXTERNAL FIXATION RIGHT LEG, I&D LEFT LEG;  Surgeon: Shona Needles, MD;  Location: [place];  Service: Orthopedics;  Laterality: Bilateral;   KNEE ARTHROSCOPY WITH LATERAL MENISECTOMY Right 01/10/2021   Procedure: RIGHT KNEE ARTHROSCOPY WITH LATERAL MENISECTOMY DEBRIDEMENT CHONDROPLASTY  LYSIS OF ADHESIONS WITH MANIPULATION;  Surgeon:  Hiram Gash, MD;  Location: [place];  Service: Orthopedics;  Laterality: Right;   ORIF TIBIA PLATEAU Right 02/21/2019   Procedure: OPEN REDUCTION INTERNAL FIXATION (ORIF) TIBIAL PLATEAU;  Surgeon: Shona Needles, MD;  Location: [place];  Service: Orthopedics;  Laterality: Right;   Past Medical History:  Diagnosis Date   Anxiety    Bilateral pneumothorax    C7 cervical fracture (HCC)    Depression    Hypertension    SAH (subarachnoid hemorrhage) (HCC)    Sinus tachycardia    TBI (traumatic brain injury) ([facility])    Vitamin D deficiency    BP (!) 140/96   Pulse 73   Ht '5\' 10"'$  (1.778 m)   Wt 145 lb 12.8 oz (66.1 kg)   SpO2 96%   BMI 20.92 kg/m   Opioid Risk Score:   Fall Risk Score:  `1  Depression screen PHQ 2/9     08/27/2022    2:14 PM 04/18/2022    1:53 PM 01/29/2022   10:41 AM 11/20/2021    3:36 PM 10/16/2021   10:33 AM 07/01/2021    9:08 AM 05/29/2021    1:41 PM  Depression screen PHQ 2/9  Decreased Interest 1 2 0 2 1 0 1  Down, Depressed, Hopeless '1 3 2 3 1 1 1  '$ PHQ - 2 Score '2 5 2 5 2 1 2  '$ Altered sleeping  2  2     Tired, decreased energy  2  2     Change in appetite  2  0     Feeling bad or failure about yourself   1  2     Trouble concentrating  2  2     Moving slowly or fidgety/restless  2  2     Suicidal thoughts  2  1     PHQ-9 Score  18  16     Difficult doing work/chores    Somewhat difficult         Review of Systems  Musculoskeletal:        Left side pain  All other systems reviewed and are negative.     Objective:   Physical Exam  Constitutional: No distress . Vital signs reviewed. HEENT: NCAT, EOMI, oral membranes moist Neck: supple Cardiovascular: RRR without murmur. No JVD    Respiratory/Chest: CTA Bilaterally without wheezes or rales. Normal effort    GI/Abdomen: BS +, non-tender, non-distended Ext: no clubbing, cyanosis, or edema Psych: pleasant and cooperative   Neuro:  attention and concentration/stable.  reasonable  insight and awareness.  No cn findings. Balance reaosnable with gait. Strength 4-5/5.  Musculoskeletal: Persistent antalgia with weightbearing on the right lower extremity.  There is crepitus with range of motion of the right knee as well.  No obvious joint effusion appreciated.        Assessment & Plan:  1. Multiple trauma with TBI                                                                                            -remains  non-vocational   2. Closed Bicondylar Fracture of Left/Right Tibial Plateau -edema mgt as advised per orthopedics Resume diclofenac 75 mg p.o. twice daily -continue gabapentin 600 mg 3 times daily for pain as well as to help his mood stabilization.   -We discussed the use of marijuana even with the medications he already is on in the setting of his traumatic brain injury. He swears to me that he's not using.  3. Cervical Fracture ( C7): cleared from a NS standpoing 4. Insomnia,auditory hallucinations-TBI behavioral changes. PTSD sx as well with nightmares and vivid dreams--seem improved.  -  Seroquel   300 mg XR at bedtime -prazosin for nightmares and sweating -Continue Celexa 60 mg  qhs for depression and agitation - Depakote   '500mg'$  tid.  Recent valproic acid level and liver function tests are acceptable.     5. Continue f/u with Dr. Sima Matas for   his mood and behaviors.             -      15 minutes of face to face patient care time were spent during this visit. All questions were encouraged and answered.  Follow up with me in 3 mos .

## 2022-11-26 NOTE — Patient Instructions (Signed)
ALWAYS FEEL FREE TO CALL OUR OFFICE WITH ANY PROBLEMS OR QUESTIONS ([phone])  **PLEASE NOTE** ALL MEDICATION REFILL REQUESTS (INCLUDING CONTROLLED SUBSTANCES) NEED TO BE MADE AT LEAST 7 DAYS PRIOR TO REFILL BEING DUE. ANY REFILL REQUESTS INSIDE THAT TIME FRAME MAY RESULT IN DELAYS IN RECEIVING YOUR PRESCRIPTION.                    

## 2023-01-09 ENCOUNTER — Other Ambulatory Visit (HOSPITAL_COMMUNITY): Payer: Self-pay

## 2023-01-14 ENCOUNTER — Other Ambulatory Visit (HOSPITAL_COMMUNITY): Payer: Self-pay

## 2023-02-04 ENCOUNTER — Ambulatory Visit: Payer: Medicaid Other | Admitting: Physical Medicine & Rehabilitation

## 2023-02-25 ENCOUNTER — Ambulatory Visit: Payer: 59 | Attending: Nurse Practitioner

## 2023-02-25 VITALS — Ht 70.0 in | Wt 145.0 lb

## 2023-02-25 DIAGNOSIS — Z0000 Encounter for general adult medical examination without abnormal findings: Secondary | ICD-10-CM

## 2023-02-25 NOTE — Patient Instructions (Addendum)
Gary Ashley , Thank you for taking time to come for your Medicare Wellness Visit. I appreciate your ongoing commitment to your health goals. Please review the following plan we discussed and let me know if I can assist you in the future.   These are the goals we discussed:  Goals      Prevent falls     Remain active        This is a list of the screening recommended for you and due dates:  Health Maintenance  Topic Date Due   COVID-19 Vaccine (1) Never done   Flu Shot  04/16/2023   Medicare Annual Wellness Visit  02/25/2024   DTaP/Tdap/Td vaccine (2 - Td or Tdap) 02/18/2029   Hepatitis C Screening  Completed   HIV Screening  Completed   HPV Vaccine  Aged Out    Advanced directives: Information on Advanced Care Planning can be found at [facility] Advance Health Care Directives Advance Health Care Directives ([email or web address] Please bring a copy of your health care power of attorney and living will to the office to be added to your chart at your convenience.  Conditions/risks identified: Aim for 30 minutes of exercise or brisk walking, 6-8 glasses of water, and 5 servings of fruits and vegetables each day.  Next appointment: Follow up in one year for your annual wellness visit   Preventive Care 40-64 Years, Male Preventive care refers to lifestyle choices and visits with your health care provider that can promote health and wellness. What does preventive care include? A yearly physical exam. This is also called an annual well check. Dental exams once or twice a year. Routine eye exams. Ask your health care provider how often you should have your eyes checked. Personal lifestyle choices, including: Daily care of your teeth and gums. Regular physical activity. Eating a healthy diet. Avoiding tobacco and drug use. Limiting alcohol use. Practicing safe sex. Taking low-dose aspirin every day starting at age 32. What happens during an annual well check? The services  and screenings done by your health care provider during your annual well check will depend on your age, overall health, lifestyle risk factors, and family history of disease. Counseling  Your health care provider may ask you questions about your: Alcohol use. Tobacco use. Drug use. Emotional well-being. Home and relationship well-being. Sexual activity. Eating habits. Work and work Astronomer. Screening  You may have the following tests or measurements: Height, weight, and BMI. Blood pressure. Lipid and cholesterol levels. These may be checked every 5 years, or more frequently if you are over 8 years old. Skin check. Lung cancer screening. You may have this screening every year starting at age 61 if you have a 30-pack-year history of smoking and currently smoke or have quit within the past 15 years. Fecal occult blood test (FOBT) of the stool. You may have this test every year starting at age 64. Flexible sigmoidoscopy or colonoscopy. You may have a sigmoidoscopy every 5 years or a colonoscopy every 10 years starting at age 67. Prostate cancer screening. Recommendations will vary depending on your family history and other risks. Hepatitis C blood test. Hepatitis B blood test. Sexually transmitted disease (STD) testing. Diabetes screening. This is done by checking your blood sugar (glucose) after you have not eaten for a while (fasting). You may have this done every 1-3 years. Discuss your test results, treatment options, and if necessary, the need for more tests with your health care provider. Vaccines  Your  health care provider may recommend certain vaccines, such as: Influenza vaccine. This is recommended every year. Tetanus, diphtheria, and acellular pertussis (Tdap, Td) vaccine. You may need a Td booster every 10 years. Zoster vaccine. You may need this after age 20. Pneumococcal 13-valent conjugate (PCV13) vaccine. You may need this if you have certain conditions and have not  been vaccinated. Pneumococcal polysaccharide (PPSV23) vaccine. You may need one or two doses if you smoke cigarettes or if you have certain conditions. Talk to your health care provider about which screenings and vaccines you need and how often you need them. This information is not intended to replace advice given to you by your health care provider. Make sure you discuss any questions you have with your health care provider. Document Released: 09/28/2015 Document Revised: 05/21/2016 Document Reviewed: 07/03/2015 Elsevier Interactive Patient Education  2017 [employer].  Fall Prevention in the Home Falls can cause injuries. They can happen to people of all ages. There are many things you can do to make your home safe and to help prevent falls. What can I do on the outside of my home? Regularly fix the edges of walkways and driveways and fix any cracks. Remove anything that might make you trip as you walk through a door, such as a raised step or threshold. Trim any bushes or trees on the path to your home. Use bright outdoor lighting. Clear any walking paths of anything that might make someone trip, such as rocks or tools. Regularly check to see if handrails are loose or broken. Make sure that both sides of any steps have handrails. Any raised decks and porches should have guardrails on the edges. Have any leaves, snow, or ice cleared regularly. Use sand or salt on walking paths during winter. Clean up any spills in your garage right away. This includes oil or grease spills. What can I do in the bathroom? Use night lights. Install grab bars by the toilet and in the tub and shower. Do not use towel bars as grab bars. Use non-skid mats or decals in the tub or shower. If you need to sit down in the shower, use a plastic, non-slip stool. Keep the floor dry. Clean up any water that spills on the floor as soon as it happens. Remove soap buildup in the tub or shower regularly. Attach bath mats  securely with double-sided non-slip rug tape. Do not have throw rugs and other things on the floor that can make you trip. What can I do in the bedroom? Use night lights. Make sure that you have a light by your bed that is easy to reach. Do not use any sheets or blankets that are too big for your bed. They should not hang down onto the floor. Have a firm chair that has side arms. You can use this for support while you get dressed. Do not have throw rugs and other things on the floor that can make you trip. What can I do in the kitchen? Clean up any spills right away. Avoid walking on wet floors. Keep items that you use a lot in easy-to-reach places. If you need to reach something above you, use a strong step stool that has a grab bar. Keep electrical cords out of the way. Do not use floor polish or wax that makes floors slippery. If you must use wax, use non-skid floor wax. Do not have throw rugs and other things on the floor that can make you trip. What can I do  with my stairs? Do not leave any items on the stairs. Make sure that there are handrails on both sides of the stairs and use them. Fix handrails that are broken or loose. Make sure that handrails are as long as the stairways. Check any carpeting to make sure that it is firmly attached to the stairs. Fix any carpet that is loose or worn. Avoid having throw rugs at the top or bottom of the stairs. If you do have throw rugs, attach them to the floor with carpet tape. Make sure that you have a light switch at the top of the stairs and the bottom of the stairs. If you do not have them, ask someone to add them for you. What else can I do to help prevent falls? Wear shoes that: Do not have high heels. Have rubber bottoms. Are comfortable and fit you well. Are closed at the toe. Do not wear sandals. If you use a stepladder: Make sure that it is fully opened. Do not climb a closed stepladder. Make sure that both sides of the stepladder  are locked into place. Ask someone to hold it for you, if possible. Clearly mark and make sure that you can see: Any grab bars or handrails. First and last steps. Where the edge of each step is. Use tools that help you move around (mobility aids) if they are needed. These include: Canes. Walkers. Scooters. Crutches. Turn on the lights when you go into a dark area. Replace any light bulbs as soon as they burn out. Set up your furniture so you have a clear path. Avoid moving your furniture around. If any of your floors are uneven, fix them. If there are any pets around you, be aware of where they are. Review your medicines with your doctor. Some medicines can make you feel dizzy. This can increase your chance of falling. Ask your doctor what other things that you can do to help prevent falls. This information is not intended to replace advice given to you by your health care provider. Make sure you discuss any questions you have with your health care provider. Document Released: 06/28/2009 Document Revised: 02/07/2016 Document Reviewed: 10/06/2014 Elsevier Interactive Patient Education  2017 [employer].

## 2023-02-25 NOTE — Progress Notes (Signed)
Subjective:   Gary Ashley is a 42 y.o. male who presents for Medicare Annual/Subsequent preventive examination.  I connected with  Gary Ashley on 02/25/23 by a audio enabled telemedicine application and verified that I am speaking with the correct person using two identifiers.  Patient Location: Home  Provider Location: Home Office  I discussed the limitations of evaluation and management by telemedicine. The patient expressed understanding and agreed to proceed.  Review of Systems     Cardiac Risk Factors include: male gender;sedentary lifestyle     Objective:    Today's Vitals   02/25/23 1347  Weight: 145 lb (65.8 kg)  Height: 5\' 10"  (1.778 m)   Body mass index is 20.81 kg/m.     02/25/2023    1:58 PM 01/16/2021    4:34 PM 01/10/2021    9:55 AM 12/25/2020   12:40 PM 03/05/2020    1:08 AM 11/04/2019    8:03 AM 03/02/2019    4:59 PM  Advanced Directives  Does Patient Have a Medical Advance Directive? No No No No No No No  Copy of Healthcare Power of Attorney in Chart?    No - copy requested     Would patient like information on creating a medical advance directive? Yes (MAU/Ambulatory/Procedural Areas - Information given) No - Patient declined No - Patient declined No - Patient declined No - Patient declined  No - Patient declined    Current Medications (verified) Outpatient Encounter Medications as of 02/25/2023  Medication Sig   citalopram (CELEXA) 20 MG tablet Take 3 tablets (60 mg total) by mouth at bedtime.   diclofenac (VOLTAREN) 75 MG EC tablet Take 1 tablet (75 mg total) by mouth 2 (two) times daily with a meal.   divalproex (DEPAKOTE) 500 MG DR tablet Take 1 tablet (500 mg total) by mouth 3 (three) times daily.   gabapentin (NEURONTIN) 600 MG tablet Take 1 tablet (600 mg total) by mouth 3 (three) times daily.   QUEtiapine (SEROQUEL XR) 300 MG 24 hr tablet Take 1 tablet (300 mg total) by mouth at bedtime.   chlorhexidine (PERIDEX) 0.12 % solution  Rinse mouth with 15 mls 2 times daily as directed. Do not swallow (Patient not taking: Reported on 02/25/2023)   dicyclomine (BENTYL) 10 MG capsule Take 1 capsule (10 mg total) by mouth every 6 (six) hours as needed (cramps/diarrhea). (Patient not taking: Reported on 02/25/2023)   HYDROcodone-acetaminophen (NORCO/VICODIN) 5-325 MG tablet Take 1 tablet by mouth every 4-6 hours as needed for pain (Patient not taking: Reported on 02/25/2023)   methocarbamol (ROBAXIN) 500 MG tablet Take 1-2 tablets (500-1,000 mg total) by mouth 4 (four) times daily. (Patient not taking: Reported on 02/25/2023)   metoprolol tartrate (LOPRESSOR) 25 MG tablet Take 0.5 tablets (12.5 mg total) by mouth 2 (two) times daily. (Patient not taking: Reported on 02/25/2023)   prazosin (MINIPRESS) 2 MG capsule Take 1 capsule by mouth at bedtime. (Patient not taking: Reported on 02/25/2023)   Vitamin D, Ergocalciferol, (DRISDOL) 1.25 MG (50000 UNIT) CAPS capsule Take 1 capsule (50,000 Units total) by mouth every 7 (seven) days. (Patient not taking: Reported on 02/25/2023)   No facility-administered encounter medications on file as of 02/25/2023.    Allergies (verified) Penicillins   History: Past Medical History:  Diagnosis Date   Anxiety    Bilateral pneumothorax    C7 cervical fracture (HCC)    Depression    Hypertension    SAH (subarachnoid hemorrhage) (HCC)    Sinus  tachycardia    TBI (traumatic brain injury) (HCC)    Vitamin D deficiency    Past Surgical History:  Procedure Laterality Date   APPENDECTOMY     EXTERNAL FIXATION LEG Bilateral 02/18/2019   Procedure: EXTERNAL FIXATION RIGHT LEG, I&D LEFT LEG;  Surgeon: Roby Lofts, MD;  Location: [facility];  Service: Orthopedics;  Laterality: Bilateral;   KNEE ARTHROSCOPY WITH LATERAL MENISECTOMY Right 01/10/2021   Procedure: RIGHT KNEE ARTHROSCOPY WITH LATERAL MENISECTOMY DEBRIDEMENT CHONDROPLASTY  LYSIS OF ADHESIONS WITH MANIPULATION;  Surgeon: Bjorn Pippin, MD;  Location:  [facility];  Service: Orthopedics;  Laterality: Right;   ORIF TIBIA PLATEAU Right 02/21/2019   Procedure: OPEN REDUCTION INTERNAL FIXATION (ORIF) TIBIAL PLATEAU;  Surgeon: Roby Lofts, MD;  Location: [facility] OR;  Service: Orthopedics;  Laterality: Right;   Family History  Problem Relation Age of Onset   Healthy Mother    Healthy Father    Colon cancer Neg Hx    Stomach cancer Neg Hx    Esophageal cancer Neg Hx    Colon polyps Neg Hx    Social History   Socioeconomic History   Marital status: Single    Spouse name: Not on file   Number of children: 4   Years of education: Not on file   Highest education level: Not on file  Occupational History   Occupation: SSI  Tobacco Use   Smoking status: Some Days    Types: Cigarettes   Smokeless tobacco: Never  Vaping Use   Vaping Use: Never used  Substance and Sexual Activity   Alcohol use: Not Currently   Drug use: Yes    Types: Marijuana    Comment: sometimes   Sexual activity: Yes  Other Topics Concern   Not on file  Social History Narrative   Not on file   Social Determinants of Health   Financial Resource Strain: Low Risk  (02/25/2023)   Overall Financial Resource Strain (CARDIA)    Difficulty of Paying Living Expenses: Not hard at all  Food Insecurity: No Food Insecurity (02/25/2023)   Hunger Vital Sign    Worried About Running Out of Food in the Last Year: Never true    Ran Out of Food in the Last Year: Never true  Transportation Needs: No Transportation Needs (02/25/2023)   PRAPARE - Administrator, Civil Service (Medical): No    Lack of Transportation (Non-Medical): No  Physical Activity: Inactive (02/25/2023)   Exercise Vital Sign    Days of Exercise per Week: 0 days    Minutes of Exercise per Session: 0 min  Stress: No Stress Concern Present (02/25/2023)   [employer] of Occupational Health - Occupational Stress Questionnaire    Feeling of Stress : Only a little  Social Connections:  Moderately Isolated (02/25/2023)   Social Connection and Isolation Panel [NHANES]    Frequency of Communication with Friends and Family: More than three times a week    Frequency of Social Gatherings with Friends and Family: Three times a week    Attends Religious Services: 1 to 4 times per year    Active Member of Clubs or Organizations: No    Attends Banker Meetings: Never    Marital Status: Never married    Tobacco Counseling Ready to quit: Not Answered Counseling given: Not Answered   Clinical Intake:  Pre-visit preparation completed: Yes  Pain : No/denies pain  Diabetes: No  How often do you need to have someone  help you when you read instructions, pamphlets, or other written materials from your doctor or pharmacy?: 1 - Never  Diabetic?No   Interpreter Needed?: No  Information entered by :: Kandis Fantasia LPN   Activities of Daily Living    02/25/2023    1:58 PM  In your present state of health, do you have any difficulty performing the following activities:  Hearing? 0  Vision? 0  Difficulty concentrating or making decisions? 1  Walking or climbing stairs? 1  Dressing or bathing? 0  Doing errands, shopping? 0  Preparing Food and eating ? N  Using the Toilet? N  In the past six months, have you accidently leaked urine? N  Do you have problems with loss of bowel control? N  Managing your Medications? N  Managing your Finances? N  Housekeeping or managing your Housekeeping? Y    Patient Care Team: Claiborne Rigg, NP as PCP - General (Nurse Practitioner) Ranelle Oyster, MD as Consulting Physician (Physical Medicine and Rehabilitation) Hershal Coria, PsyD as Consulting Physician (Psychology)  Indicate any recent Medical Services you may have received from other than [facility] providers in the past year (date may be approximate).     Assessment:   This is a routine wellness examination for Gary Ashley.  Hearing/Vision screen Hearing  Screening - Comments:: Denies hearing difficulties   Vision Screening - Comments:: No vision problems; will schedule routine eye exam soon    Dietary issues and exercise activities discussed: Current Exercise Habits: The patient does not participate in regular exercise at present   Goals Addressed             This Visit's Progress    Prevent falls       Remain active         Depression Screen    02/25/2023    1:54 PM 10/20/2022    8:40 AM 08/27/2022    2:14 PM 04/18/2022    1:53 PM 01/29/2022   10:41 AM 11/20/2021    3:36 PM 10/16/2021   10:33 AM  PHQ 2/9 Scores  PHQ - 2 Score 0  2 5 2 5 2   PHQ- 9 Score    18  16   Exception Documentation  Patient refusal         Fall Risk    02/25/2023    1:49 PM 11/26/2022    9:43 AM 11/05/2022    2:55 PM 10/20/2022    8:37 AM 08/27/2022    2:13 PM  Fall Risk   Falls in the past year? 0 0 1 1 1   Comment     LAST IN DEC 2023  Number falls in past yr: 0  1 0 1  Injury with Fall? 0  1 0 0  Risk for fall due to : No Fall Risks   No Fall Risks   Follow up Falls prevention discussed;Education provided;Falls evaluation completed   Falls evaluation completed     FALL RISK PREVENTION PERTAINING TO THE HOME:  Any stairs in or around the home? No  If so, are there any without handrails? No  Home free of loose throw rugs in walkways, pet beds, electrical cords, etc? Yes  Adequate lighting in your home to reduce risk of falls? Yes   ASSISTIVE DEVICES UTILIZED TO PREVENT FALLS:  Life alert? No  Use of a cane, walker or w/c? Yes  Grab bars in the bathroom? Yes  Shower chair or bench in shower? No  Elevated toilet seat or  a handicapped toilet? No   TIMED UP AND GO:  Was the test performed? No . Telephonic visit   Cognitive Function:        02/25/2023    1:58 PM 04/18/2022    1:41 PM  6CIT Screen  What Year? 0 points 4 points  What month? 3 points 3 points  What time? 0 points 0 points  Count back from 20 2 points 4 points  Months in  reverse 2 points 4 points  Repeat phrase 2 points 2 points  Total Score 9 points 17 points    Immunizations Immunization History  Administered Date(s) Administered   Influenza,inj,Quad PF,6+ Mos 08/16/2021, 10/10/2022   Tdap 02/19/2019    TDAP status: Up to date  Pneumococcal vaccine status: Up to date  Covid-19 vaccine status: Information provided on how to obtain vaccines.   Qualifies for Shingles Vaccine? No    Screening Tests Health Maintenance  Topic Date Due   COVID-19 Vaccine (1) Never done   INFLUENZA VACCINE  04/16/2023   Medicare Annual Wellness (AWV)  02/25/2024   DTaP/Tdap/Td (2 - Td or Tdap) 02/18/2029   Hepatitis C Screening  Completed   HIV Screening  Completed   HPV VACCINES  Aged Out    Health Maintenance  Health Maintenance Due  Topic Date Due   COVID-19 Vaccine (1) Never done    Lung Cancer Screening: (Low Dose CT Chest recommended if Age 26-80 years, 30 pack-year currently smoking OR have quit w/in 15years.) does not qualify.   Lung Cancer Screening Referral: n/a  Additional Screening:  Hepatitis C Screening: does qualify; Completed 11/20/21  Vision Screening: Recommended annual ophthalmology exams for early detection of glaucoma and other disorders of the eye. Is the patient up to date with their annual eye exam?  No  Who is the provider or what is the name of the office in which the patient attends annual eye exams? none If pt is not established with a provider, would they like to be referred to a provider to establish care? No .   Dental Screening: Recommended annual dental exams for proper oral hygiene  Community Resource Referral / Chronic Care Management: CRR required this visit?  No   CCM required this visit?  No      Plan:     I have personally reviewed and noted the following in the patient's chart:   Medical and social history Use of alcohol, tobacco or illicit drugs  Current medications and supplements including opioid  prescriptions. Patient is not currently taking opioid prescriptions. Functional ability and status Nutritional status Physical activity Advanced directives List of other physicians Hospitalizations, surgeries, and ER visits in previous 12 months Vitals Screenings to include cognitive, depression, and falls Referrals and appointments  In addition, I have reviewed and discussed with patient certain preventive protocols, quality metrics, and best practice recommendations. A written personalized care plan for preventive services as well as general preventive health recommendations were provided to patient.     Durwin Nora, [place]   02/17/5408   Due to this being a virtual visit, the after visit summary with patients personalized plan was offered to patient via mail or my-chart. Patient would like to access on my-chart  Nurse Notes: No concerns

## 2023-03-25 ENCOUNTER — Encounter: Payer: Self-pay | Admitting: Physical Medicine & Rehabilitation

## 2023-03-25 ENCOUNTER — Encounter: Payer: 59 | Attending: Physical Medicine & Rehabilitation | Admitting: Physical Medicine & Rehabilitation

## 2023-03-25 ENCOUNTER — Other Ambulatory Visit: Payer: Self-pay

## 2023-03-25 DIAGNOSIS — S069X0S Unspecified intracranial injury without loss of consciousness, sequela: Secondary | ICD-10-CM | POA: Diagnosis present

## 2023-03-25 DIAGNOSIS — F329 Major depressive disorder, single episode, unspecified: Secondary | ICD-10-CM | POA: Diagnosis present

## 2023-03-25 DIAGNOSIS — R4689 Other symptoms and signs involving appearance and behavior: Secondary | ICD-10-CM | POA: Insufficient documentation

## 2023-03-25 DIAGNOSIS — F4310 Post-traumatic stress disorder, unspecified: Secondary | ICD-10-CM | POA: Diagnosis present

## 2023-03-25 DIAGNOSIS — S069XAS Unspecified intracranial injury with loss of consciousness status unknown, sequela: Secondary | ICD-10-CM

## 2023-03-25 MED ORDER — PRAZOSIN HCL 2 MG PO CAPS
4.0000 mg | ORAL_CAPSULE | Freq: Every day | ORAL | 5 refills | Status: AC
  Filled 2023-03-25: qty 60, 30d supply, fill #0
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
## 2023-03-25 NOTE — Patient Instructions (Signed)
TAKE 2 OF THE PRAZOSIN AT BEDTIME (4MG  TOTAL) FOR YOUR FLASHBACKS/PTSD SYMPTOMS.

## 2023-04-20 ENCOUNTER — Ambulatory Visit: Payer: Medicaid Other | Admitting: Nurse Practitioner

## 2023-05-04 ENCOUNTER — Telehealth: Payer: Self-pay | Admitting: Nurse Practitioner

## 2023-05-04 NOTE — Telephone Encounter (Signed)
Patient called to set up appt for Flu vaccination. Please f/u with patient to schedule him for the flu shot.

## 2023-05-04 NOTE — Telephone Encounter (Signed)
Flu vaccine is currently unavailable. Patient is scheduled for office visit on 06/02/2023. Flu vaccine should be available at this appointment.

## 2023-05-11 ENCOUNTER — Ambulatory Visit: Payer: Self-pay

## 2023-05-11 NOTE — Telephone Encounter (Signed)
Summary: COVID vaccine   Pt is asking when he should get his next COVID vaccine. He stated he has gotten the vaccine before but doesn't remember when.  Seeking clinical advice.         Chief Complaint: Asking about getting next COVID booster vaccine. Will talk with PCP at next appointment. Symptoms: n/a Frequency: n/a Pertinent Negatives: Patient denies  Disposition: [] ED /[] Urgent Care (no appt availability in office) / [] Appointment(In office/virtual)/ []  [place] [facility]/ [] Home Care/ [] Refused Recommended Disposition /[] [place] [facility]/ [x]  Follow-up with PCP Additional Notes:   Reason for Disposition  COVID-19 vaccine, Frequently Asked Questions (FAQs)  Answer Assessment - Initial Assessment Questions 1. MAIN CONCERN OR SYMPTOM:  "What is your main concern right now?" "What question do you have?" "What's the main symptom you're worried about?" (e.g., fever, pain, redness, swelling)     Should I get the next COVID vaccine? 2. VACCINE: "What vaccination did you receive?" (e.g., none; AstraZeneca, J&J, Moderna, Pfizer, other)      N/a 3. SYMPTOM ONSET: "When did the  begin?" (e.g., not relevant; hours, days)      N/a 4. SYMPTOM SEVERITY: "How bad is it?"      N/a 5. FEVER: "Is there a fever?" If Yes, ask: "What is it, how was it measured, and when did it start?"      N/a 6. PAST REACTIONS: "Have you reacted to immunizations before?" If Yes, ask: "What happened?"     No 7. OTHER SYMPTOMS: "Do you have any other symptoms?" (e.g., fatigue, headache, joint or muscle pain)     N/a 8. PREGNANCY: "Is there any chance you are pregnant?" "When was your last menstrual period?"     N/a  Protocols used: Coronavirus (COVID-19) Vaccine Questions and Reactions-A-AH

## 2023-06-02 ENCOUNTER — Other Ambulatory Visit (HOSPITAL_BASED_OUTPATIENT_CLINIC_OR_DEPARTMENT_OTHER): Payer: Self-pay

## 2023-06-02 ENCOUNTER — Ambulatory Visit: Payer: 59 | Attending: Nurse Practitioner | Admitting: Nurse Practitioner

## 2023-06-02 ENCOUNTER — Other Ambulatory Visit: Payer: Self-pay

## 2023-06-02 ENCOUNTER — Encounter: Payer: Self-pay | Admitting: Nurse Practitioner

## 2023-06-02 VITALS — BP 138/82 | HR 57 | Ht 70.0 in | Wt 132.2 lb

## 2023-06-02 DIAGNOSIS — D649 Anemia, unspecified: Secondary | ICD-10-CM

## 2023-06-02 DIAGNOSIS — I10 Essential (primary) hypertension: Secondary | ICD-10-CM

## 2023-06-02 DIAGNOSIS — E559 Vitamin D deficiency, unspecified: Secondary | ICD-10-CM | POA: Diagnosis not present

## 2023-06-02 DIAGNOSIS — S069X9S Unspecified intracranial injury with loss of consciousness of unspecified duration, sequela: Secondary | ICD-10-CM

## 2023-06-02 DIAGNOSIS — R531 Weakness: Secondary | ICD-10-CM | POA: Diagnosis not present

## 2023-06-02 MED ORDER — VITAMIN D (ERGOCALCIFEROL) 1.25 MG (50000 UNIT) PO CAPS
50000.0000 [IU] | ORAL_CAPSULE | ORAL | 0 refills | Status: DC
  Filled 2023-06-02 – 2023-06-10 (×3): qty 12, 84d supply, fill #0

## 2023-06-02 MED ORDER — AMLODIPINE BESYLATE 10 MG PO TABS
10.0000 mg | ORAL_TABLET | Freq: Every day | ORAL | 1 refills | Status: AC
  Filled 2023-06-02 – 2023-06-10 (×3): qty 90, 90d supply, fill #0

## 2023-06-02 NOTE — Progress Notes (Signed)
Assessment & Plan:  Gary Ashley was seen today for medical management of chronic issues.  Diagnoses and all orders for this visit:  Primary hypertension Follow-up in 4 weeks for blood pressure recheck.  Discontinue metoprolol -     amLODipine (NORVASC) 10 MG tablet; Take 1 tablet (10 mg total) by mouth daily. FOR BLOOD PRESSURE -     CMP14+EGFR  Vitamin D deficiency disease Taking vitamin D for 3 months and then start over-the-counter vitamin D3 2000 to 5000 units daily. -     Vitamin D, Ergocalciferol, (DRISDOL) 1.25 MG (50000 UNIT) CAPS capsule; Take 1 capsule (50,000 Units total) by mouth every 7 (seven) days. Take once a week. -     VITAMIN D 25 Hydroxy (Vit-D Deficiency, Fractures)  Weakness Shower chair has been ordered today as he endorses falls in the shower and weakness with ambulation -     Ambulatory referral to Physical Therapy  Anemia, unspecified type -     CBC with Differential    Patient has been counseled on age-appropriate routine health concerns for screening and prevention. These are reviewed and up-to-date. Referrals have been placed accordingly. Immunizations are up-to-date or declined.    Subjective:   Chief Complaint  Patient presents with   Medical Management of Chronic Issues   HPI Gary Ashley 42 y.o. 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   HTN Blood pressure is elevated today. He is not sure if he is taking his metoprolol or not. Heart rate is low today. At this time as he is not sure if he is even taking, I will dc metoprolol. Will start on amlodipine today.  BP Readings from Last 3 Encounters:  06/02/23 138/82  03/25/23 (!) 152/108  11/26/22 (!)  141/95        Review of Systems  Constitutional:  Negative for fever, malaise/fatigue and weight loss.  HENT: Negative.  Negative for nosebleeds.   Eyes: Negative.  Negative for blurred vision, double vision and photophobia.  Respiratory: Negative.  Negative for cough and shortness of breath.   Cardiovascular: Negative.  Negative for chest pain, palpitations and leg swelling.  Gastrointestinal: Negative.  Negative for heartburn, nausea and vomiting.  Musculoskeletal: Negative.  Negative for myalgias.  Neurological:  Positive for weakness. Negative for dizziness, focal weakness, seizures and headaches.  Psychiatric/Behavioral: Negative.  Negative for suicidal ideas.     Past Medical History:  Diagnosis Date   Anxiety    Bilateral pneumothorax    C7 cervical fracture (HCC)    Depression    Hypertension    SAH (subarachnoid hemorrhage) (HCC)    Sinus tachycardia    TBI (traumatic brain injury) (HCC)    Vitamin D deficiency     Past Surgical History:  Procedure Laterality Date   APPENDECTOMY     EXTERNAL FIXATION LEG Bilateral 02/18/2019   Procedure: EXTERNAL FIXATION RIGHT LEG, I&D LEFT LEG;  Surgeon: Roby Lofts, MD;  Location: [facility];  Service: Orthopedics;  Laterality: Bilateral;   KNEE ARTHROSCOPY WITH LATERAL MENISECTOMY Right 01/10/2021   Procedure: RIGHT KNEE ARTHROSCOPY WITH LATERAL MENISECTOMY DEBRIDEMENT CHONDROPLASTY  LYSIS OF ADHESIONS WITH MANIPULATION;  Surgeon: Bjorn Pippin, MD;  Location: [facility];  Service: Orthopedics;  Laterality: Right;  ORIF TIBIA PLATEAU Right 02/21/2019   Procedure: OPEN REDUCTION INTERNAL FIXATION (ORIF) TIBIAL PLATEAU;  Surgeon: Roby Lofts, MD;  Location: [facility];  Service: Orthopedics;  Laterality: Right;    Family History  Problem Relation Age of Onset   Healthy Mother    Healthy Father    Colon cancer Neg Hx    Stomach cancer Neg Hx    Esophageal cancer Neg Hx    Colon polyps Neg Hx     Social History  Reviewed with no changes to be made today.   Outpatient Medications Prior to Visit  Medication Sig Dispense Refill   citalopram (CELEXA) 20 MG tablet Take 3 tablets (60 mg total) by mouth at bedtime. 90 tablet 6   diclofenac (VOLTAREN) 75 MG EC tablet Take 1 tablet (75 mg total) by mouth 2 (two) times daily with a meal. 60 tablet 3   divalproex (DEPAKOTE) 500 MG DR tablet Take 1 tablet (500 mg total) by mouth 3 (three) times daily. 90 tablet 6   gabapentin (NEURONTIN) 600 MG tablet Take 1 tablet (600 mg total) by mouth 3 (three) times daily. 90 tablet 3   prazosin (MINIPRESS) 2 MG capsule Take 2 capsules (4 mg total) by mouth at bedtime. 60 capsule 5   QUEtiapine (SEROQUEL XR) 300 MG 24 hr tablet Take 1 tablet (300 mg total) by mouth at bedtime. 30 tablet 6   dicyclomine (BENTYL) 10 MG capsule Take 1 capsule (10 mg total) by mouth every 6 (six) hours as needed (cramps/diarrhea). 45 capsule 0   Docusate Sodium (DSS) 100 MG CAPS Take by mouth.     gabapentin (NEURONTIN) 300 MG capsule Take by mouth.     HYDROcodone-acetaminophen (NORCO/VICODIN) 5-325 MG tablet Take 1 tablet by mouth every 4-6 hours as needed for pain 12 tablet 0   methocarbamol (ROBAXIN) 500 MG tablet Take by mouth.     metoprolol tartrate (LOPRESSOR) 25 MG tablet Take 0.5 tablets (12.5 mg total) by mouth 2 (two) times daily. 30 tablet 1   traMADol (ULTRAM) 50 MG tablet Take by mouth.     acetaminophen (TYLENOL) 325 MG tablet Take by mouth. (Patient not taking: Reported on 06/02/2023)     chlorhexidine (PERIDEX) 0.12 % solution Rinse mouth with 15 mls 2 times daily as directed. Do not swallow (Patient not taking: Reported on 06/02/2023) 946 mL 3   cholecalciferol (VITAMIN D3) 25 MCG (1000 UNIT) tablet Take by mouth. (Patient not taking: Reported on 06/02/2023)     Vitamin D, Ergocalciferol, (DRISDOL) 1.25 MG (50000 UNIT) CAPS capsule Take 1 capsule (50,000 Units total) by mouth every 7 (seven) days. (Patient not taking: Reported on  06/02/2023) 12 capsule 1   No facility-administered medications prior to visit.    Allergies  Allergen Reactions   Penicillins     Swelling Tolerated Cephalosporin 02/22/2019         Objective:    BP 138/82   Pulse (!) 57   Ht 5\' 10"  (1.778 m)   Wt 132 lb 3.2 oz (60 kg)   SpO2 98%   BMI 18.97 kg/m  Wt Readings from Last 3 Encounters:  06/02/23 132 lb 3.2 oz (60 kg)  03/25/23 132 lb 3.2 oz (60 kg)  02/25/23 145 lb (65.8 kg)    Physical Exam Vitals and nursing note reviewed.  Constitutional:      Appearance: He is well-developed.  HENT:     Head: Normocephalic and atraumatic.  Cardiovascular:     Rate and Rhythm:  Normal rate and regular rhythm.     Heart sounds: Normal heart sounds. No murmur heard.    No friction rub. No gallop.  Pulmonary:     Effort: Pulmonary effort is normal. No tachypnea or respiratory distress.     Breath sounds: Normal breath sounds. No decreased breath sounds, wheezing, rhonchi or rales.  Chest:     Chest wall: No tenderness.  Abdominal:     General: Bowel sounds are normal.     Palpations: Abdomen is soft.  Musculoskeletal:        General: Normal range of motion.     Cervical back: Normal range of motion.  Skin:    General: Skin is warm and dry.  Neurological:     Mental Status: He is alert and oriented to person, place, and time.     Coordination: Coordination normal.  Psychiatric:        Behavior: Behavior normal. Behavior is cooperative.        Thought Content: Thought content normal.        Judgment: Judgment normal.          Patient has been counseled extensively about nutrition and exercise as well as the importance of adherence with medications and regular follow-up. The patient was given clear instructions to go to ER or return to medical center if symptoms don't improve, worsen or new problems develop. The patient verbalized understanding.   Follow-up: Return in about 4 weeks (around 06/30/2023) for BP recheck with me  luke or angela.   Claiborne Rigg, FNP-BC [facility] [place], [place] [phone]   06/02/2023, 4:29 PM

## 2023-06-05 ENCOUNTER — Other Ambulatory Visit: Payer: 59

## 2023-06-09 ENCOUNTER — Other Ambulatory Visit: Payer: Self-pay

## 2023-06-10 ENCOUNTER — Ambulatory Visit: Payer: 59 | Attending: Nurse Practitioner

## 2023-06-10 ENCOUNTER — Other Ambulatory Visit: Payer: Self-pay

## 2023-06-11 LAB — CBC WITH DIFFERENTIAL/PLATELET
Basophils Absolute: 0 10*3/uL (ref 0.0–0.2)
Basos: 1 %
EOS (ABSOLUTE): 0.1 10*3/uL (ref 0.0–0.4)
Eos: 1 %
Hematocrit: 47.6 % (ref 37.5–51.0)
Hemoglobin: 15.1 g/dL (ref 13.0–17.7)
Immature Grans (Abs): 0 10*3/uL (ref 0.0–0.1)
Immature Granulocytes: 0 %
Lymphocytes Absolute: 2 10*3/uL (ref 0.7–3.1)
Lymphs: 35 %
MCH: 30.8 pg (ref 26.6–33.0)
MCHC: 31.7 g/dL (ref 31.5–35.7)
MCV: 97 fL (ref 79–97)
Monocytes Absolute: 0.5 10*3/uL (ref 0.1–0.9)
Monocytes: 9 %
Neutrophils Absolute: 3 10*3/uL (ref 1.4–7.0)
Neutrophils: 54 %
Platelets: 224 10*3/uL (ref 150–450)
RBC: 4.9 x10E6/uL (ref 4.14–5.80)
RDW: 13.5 % (ref 11.6–15.4)
WBC: 5.6 10*3/uL (ref 3.4–10.8)

## 2023-06-11 LAB — CMP14+EGFR
ALT: 25 IU/L (ref 0–44)
AST: 27 IU/L (ref 0–40)
Albumin: 4.8 g/dL (ref 4.1–5.1)
Alkaline Phosphatase: 41 IU/L — ABNORMAL LOW (ref 44–121)
BUN/Creatinine Ratio: 11 (ref 9–20)
BUN: 12 mg/dL (ref 6–24)
Bilirubin Total: 0.6 mg/dL (ref 0.0–1.2)
CO2: 22 mmol/L (ref 20–29)
Calcium: 9.6 mg/dL (ref 8.7–10.2)
Chloride: 100 mmol/L (ref 96–106)
Creatinine, Ser: 1.07 mg/dL (ref 0.76–1.27)
Globulin, Total: 2.7 g/dL (ref 1.5–4.5)
Glucose: 79 mg/dL (ref 70–99)
Potassium: 4.3 mmol/L (ref 3.5–5.2)
Sodium: 139 mmol/L (ref 134–144)
Total Protein: 7.5 g/dL (ref 6.0–8.5)
eGFR: 88 mL/min/{1.73_m2} (ref >59)

## 2023-06-11 LAB — VITAMIN D 25 HYDROXY (VIT D DEFICIENCY, FRACTURES): Vit D, 25-Hydroxy: 17.3 ng/mL — ABNORMAL LOW (ref 30.0–100.0)

## 2023-06-23 ENCOUNTER — Ambulatory Visit: Payer: 59 | Admitting: Physical Therapy

## 2023-06-30 ENCOUNTER — Encounter: Payer: Self-pay | Admitting: Nurse Practitioner

## 2023-06-30 ENCOUNTER — Ambulatory Visit: Payer: 59 | Attending: Nurse Practitioner | Admitting: Nurse Practitioner

## 2023-06-30 VITALS — BP 113/73 | HR 60 | Ht 70.0 in | Wt 138.6 lb

## 2023-06-30 DIAGNOSIS — B070 Plantar wart: Secondary | ICD-10-CM

## 2023-06-30 DIAGNOSIS — I10 Essential (primary) hypertension: Secondary | ICD-10-CM | POA: Diagnosis not present

## 2023-06-30 NOTE — Progress Notes (Signed)
Assessment & Plan:  Gary Ashley was seen today for blood pressure check.  Diagnoses and all orders for this visit:  Primary hypertension Continue amlodipine as prescribed.  Reminded to bring in blood pressure log for follow  up appointment.  RECOMMENDATIONS: DASH/Mediterranean Diets are healthier choices for HTN.    Plantar wart of left foot -     Ambulatory referral to Podiatry    Patient has been counseled on age-appropriate routine health concerns for screening and prevention. These are reviewed and up-to-date. Referrals have been placed accordingly. Immunizations are up-to-date or declined.    Subjective:   Chief Complaint  Patient presents with   Blood Pressure Check   HPI Gary Ashley 42 y.o. male presents to office for blood pressure follow up and with complaints of left foot pain.    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     Blood pressure is well-controlled today with amlodipine 10 mg daily.   BP Readings from Last 3 Encounters:  06/30/23 113/73  06/02/23 138/82  03/25/23 (!) 152/108      Warts: Patient complains of a hard growth on the bottom of  the pad of his left foot.  The growth has been present for "a long time" There is tenderness present with palpation and weight bearing.       Review of Systems  Constitutional:  Negative for fever, malaise/fatigue and weight loss.  HENT: Negative.  Negative for nosebleeds.   Eyes: Negative.  Negative for blurred vision, double vision and photophobia.  Respiratory: Negative.  Negative for cough and shortness of breath.   Cardiovascular: Negative.  Negative for chest pain, palpitations and leg swelling.  Gastrointestinal: Negative.  Negative for heartburn, nausea  and vomiting.  Musculoskeletal: Negative.  Negative for myalgias.  Skin:        SEE HPI  Neurological: Negative.  Negative for dizziness, focal weakness, seizures and headaches.  Psychiatric/Behavioral:  Positive for memory loss. Negative for suicidal ideas.     Past Medical History:  Diagnosis Date   Anxiety    Bilateral pneumothorax    C7 cervical fracture (HCC)    Depression    Hypertension    SAH (subarachnoid hemorrhage) (HCC)    Sinus tachycardia    TBI (traumatic brain injury) (HCC)    Vitamin D deficiency     Past Surgical History:  Procedure Laterality Date   APPENDECTOMY     EXTERNAL FIXATION LEG Bilateral 02/18/2019   Procedure: EXTERNAL FIXATION RIGHT LEG, I&D LEFT LEG;  Surgeon: Roby Lofts, MD;  Location: [facility];  Service: Orthopedics;  Laterality: Bilateral;   KNEE ARTHROSCOPY WITH LATERAL MENISECTOMY Right 01/10/2021   Procedure: RIGHT KNEE ARTHROSCOPY WITH LATERAL MENISECTOMY DEBRIDEMENT CHONDROPLASTY  LYSIS OF ADHESIONS WITH MANIPULATION;  Surgeon: Bjorn Pippin, MD;  Location: [facility];  Service: Orthopedics;  Laterality: Right;   ORIF TIBIA PLATEAU Right 02/21/2019   Procedure: OPEN REDUCTION INTERNAL FIXATION (ORIF) TIBIAL PLATEAU;  Surgeon: Roby Lofts, MD;  Location: [facility];  Service: Orthopedics;  Laterality: Right;    Family History  Problem Relation Age of Onset   Healthy Mother    Healthy Father    Colon cancer Neg Hx    Stomach cancer Neg Hx    Esophageal cancer Neg Hx    Colon  polyps Neg Hx     Social History Reviewed with no changes to be made today.   Outpatient Medications Prior to Visit  Medication Sig Dispense Refill   amLODipine (NORVASC) 10 MG tablet Take 1 tablet (10 mg total) by mouth daily. FOR BLOOD PRESSURE 90 tablet 1   citalopram (CELEXA) 20 MG tablet Take 3 tablets (60 mg total) by mouth at bedtime. 90 tablet 6   diclofenac (VOLTAREN) 75 MG EC tablet Take 1 tablet (75 mg total) by mouth 2 (two) times daily  with a meal. 60 tablet 3   divalproex (DEPAKOTE) 500 MG DR tablet Take 1 tablet (500 mg total) by mouth 3 (three) times daily. 90 tablet 6   gabapentin (NEURONTIN) 600 MG tablet Take 1 tablet (600 mg total) by mouth 3 (three) times daily. 90 tablet 3   prazosin (MINIPRESS) 2 MG capsule Take 2 capsules (4 mg total) by mouth at bedtime. 60 capsule 5   QUEtiapine (SEROQUEL XR) 300 MG 24 hr tablet Take 1 tablet (300 mg total) by mouth at bedtime. 30 tablet 6   Vitamin D, Ergocalciferol, (DRISDOL) 1.25 MG (50000 UNIT) CAPS capsule Take 1 capsule (50,000 Units total) by mouth every 7 (seven) days. Take once a week. 12 capsule 0   No facility-administered medications prior to visit.    Allergies  Allergen Reactions   Penicillins     Swelling Tolerated Cephalosporin 02/22/2019         Objective:    BP 113/73 (BP Location: Left Arm, Patient Position: Sitting, Cuff Size: Normal)   Pulse 60   Ht 5\' 10"  (1.778 m)   Wt 138 lb 9.6 oz (62.9 kg)   SpO2 100%   BMI 19.89 kg/m  Wt Readings from Last 3 Encounters:  06/30/23 138 lb 9.6 oz (62.9 kg)  06/02/23 132 lb 3.2 oz (60 kg)  03/25/23 132 lb 3.2 oz (60 kg)    Physical Exam Vitals and nursing note reviewed.  Constitutional:      Appearance: He is well-developed.  HENT:     Head: Normocephalic and atraumatic.  Cardiovascular:     Rate and Rhythm: Normal rate and regular rhythm.     Heart sounds: Normal heart sounds. No murmur heard.    No friction rub. No gallop.  Pulmonary:     Effort: Pulmonary effort is normal. No tachypnea or respiratory distress.     Breath sounds: Normal breath sounds. No decreased breath sounds, wheezing, rhonchi or rales.  Chest:     Chest wall: No tenderness.  Abdominal:     General: Bowel sounds are normal.     Palpations: Abdomen is soft.  Musculoskeletal:        General: Normal range of motion.     Cervical back: Normal range of motion.  Feet:     Right foot:     Skin integrity: Dry skin present.      Left foot:     Skin integrity: Dry skin present.     Comments: See attached photos Skin:    General: Skin is warm and dry.  Neurological:     Mental Status: He is alert and oriented to person, place, and time.     Coordination: Coordination normal.  Psychiatric:        Behavior: Behavior normal. Behavior is cooperative.        Thought Content: Thought content normal.        Judgment: Judgment normal.  Patient has been counseled extensively about nutrition and exercise as well as the importance of adherence with medications and regular follow-up. The patient was given clear instructions to go to ER or return to medical center if symptoms don't improve, worsen or new problems develop. The patient verbalized understanding.   Follow-up: Return in about 3 months (around 09/30/2023).   Claiborne Rigg, FNP-BC [facility] [place], [place] [phone]   06/30/2023, 2:27 PM

## 2023-07-06 ENCOUNTER — Ambulatory Visit: Payer: 59 | Admitting: Physical Therapy

## 2023-07-08 ENCOUNTER — Ambulatory Visit (INDEPENDENT_AMBULATORY_CARE_PROVIDER_SITE_OTHER): Payer: 59 | Admitting: Podiatry

## 2023-07-08 ENCOUNTER — Encounter: Payer: Self-pay | Admitting: Podiatry

## 2023-07-08 DIAGNOSIS — D2372 Other benign neoplasm of skin of left lower limb, including hip: Secondary | ICD-10-CM | POA: Diagnosis not present

## 2023-07-08 NOTE — Progress Notes (Signed)
   Chief Complaint  Patient presents with   Foot Pain    Plantar forefoot left - callused lesion x months, sometimes its big and sometimes small, PCP eval-referred here, no treatment, tender sometimes when walking   New Patient (Initial Visit)    Subjective: 42 y.o. male presenting to the office today as a new patient for evaluation of pain and tenderness associated to skin lesions to the plantar aspect of the left foot.  He has not been anything for treatment.  Onset over 1 year ago.   Past Medical History:  Diagnosis Date   Anxiety    Bilateral pneumothorax    C7 cervical fracture (HCC)    Depression    Hypertension    SAH (subarachnoid hemorrhage) (HCC)    Sinus tachycardia    TBI (traumatic brain injury) (HCC)    Vitamin D deficiency     Past Surgical History:  Procedure Laterality Date   APPENDECTOMY     EXTERNAL FIXATION LEG Bilateral 02/18/2019   Procedure: EXTERNAL FIXATION RIGHT LEG, I&D LEFT LEG;  Surgeon: Roby Lofts, MD;  Location: [facility];  Service: Orthopedics;  Laterality: Bilateral;   KNEE ARTHROSCOPY WITH LATERAL MENISECTOMY Right 01/10/2021   Procedure: RIGHT KNEE ARTHROSCOPY WITH LATERAL MENISECTOMY DEBRIDEMENT CHONDROPLASTY  LYSIS OF ADHESIONS WITH MANIPULATION;  Surgeon: Bjorn Pippin, MD;  Location: [facility];  Service: Orthopedics;  Laterality: Right;   ORIF TIBIA PLATEAU Right 02/21/2019   Procedure: OPEN REDUCTION INTERNAL FIXATION (ORIF) TIBIAL PLATEAU;  Surgeon: Roby Lofts, MD;  Location: [facility];  Service: Orthopedics;  Laterality: Right;    Allergies  Allergen Reactions   Penicillins     Swelling Tolerated Cephalosporin 02/22/2019       Objective:  Physical Exam General: Alert and oriented x3 in no acute distress  Dermatology: Hyperkeratotic lesion(s) present on the plantar aspect of the left forefoot. Pain on palpation with a central nucleated core noted. Skin is warm, dry and supple bilateral lower extremities. Negative  for open lesions or macerations.  Vascular: Palpable pedal pulses bilaterally. No edema or erythema noted. Capillary refill within normal limits.  Neurological: Grossly intact via light touch  Musculoskeletal Exam: Pain on palpation at the keratotic lesion(s) noted. Range of motion within normal limits bilateral. Muscle strength 5/5 in all groups bilateral.  Assessment: 1.  Symptomatic benign skin lesion plantar aspect of the left forefoot   Plan of Care:  -Patient evaluated -Excisional debridement of keratoic lesion(s) using a chisel blade was performed without incident.  -Salicylic acid applied with a bandaid -Recommend OTC salicylic acid daily with a Band-Aid -Return to the clinic 4 weeks  Felecia Shelling, DPM [facility]  Dr. Felecia Shelling, DPM    [address] [place], [place] [address]                Office [phone]  Fax [phone]

## 2023-07-13 ENCOUNTER — Encounter: Payer: 59 | Attending: Psychology | Admitting: Psychology

## 2023-07-13 DIAGNOSIS — S82141A Displaced bicondylar fracture of right tibia, initial encounter for closed fracture: Secondary | ICD-10-CM | POA: Insufficient documentation

## 2023-07-13 DIAGNOSIS — F4310 Post-traumatic stress disorder, unspecified: Secondary | ICD-10-CM | POA: Diagnosis present

## 2023-07-13 DIAGNOSIS — S069XAS Unspecified intracranial injury with loss of consciousness status unknown, sequela: Secondary | ICD-10-CM | POA: Insufficient documentation

## 2023-07-13 DIAGNOSIS — G894 Chronic pain syndrome: Secondary | ICD-10-CM | POA: Insufficient documentation

## 2023-07-13 DIAGNOSIS — S069X9S Unspecified intracranial injury with loss of consciousness of unspecified duration, sequela: Secondary | ICD-10-CM | POA: Diagnosis not present

## 2023-07-13 DIAGNOSIS — F329 Major depressive disorder, single episode, unspecified: Secondary | ICD-10-CM | POA: Insufficient documentation

## 2023-07-13 DIAGNOSIS — R4689 Other symptoms and signs involving appearance and behavior: Secondary | ICD-10-CM | POA: Insufficient documentation

## 2023-07-14 ENCOUNTER — Encounter: Payer: Self-pay | Admitting: Psychology
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
## 2023-07-15 ENCOUNTER — Encounter: Payer: Self-pay | Admitting: Physical Medicine & Rehabilitation

## 2023-07-15 ENCOUNTER — Other Ambulatory Visit (HOSPITAL_COMMUNITY): Payer: Self-pay

## 2023-07-15 ENCOUNTER — Encounter (HOSPITAL_BASED_OUTPATIENT_CLINIC_OR_DEPARTMENT_OTHER): Payer: 59 | Admitting: Physical Medicine & Rehabilitation

## 2023-07-15 DIAGNOSIS — G894 Chronic pain syndrome: Secondary | ICD-10-CM

## 2023-07-15 DIAGNOSIS — S82141A Displaced bicondylar fracture of right tibia, initial encounter for closed fracture: Secondary | ICD-10-CM | POA: Diagnosis not present

## 2023-07-15 MED ORDER — DICLOFENAC SODIUM 75 MG PO TBEC
75.0000 mg | DELAYED_RELEASE_TABLET | Freq: Two times a day (BID) | ORAL | 6 refills | Status: DC
  Filled 2023-07-15: qty 60, 30d supply, fill #0

## 2023-07-15 NOTE — Patient Instructions (Signed)
ALWAYS FEEL FREE TO CALL OUR OFFICE WITH ANY PROBLEMS OR QUESTIONS ([phone])  **PLEASE NOTE** ALL MEDICATION REFILL REQUESTS (INCLUDING CONTROLLED SUBSTANCES) NEED TO BE MADE AT LEAST 7 DAYS PRIOR TO REFILL BEING DUE. ANY REFILL REQUESTS INSIDE THAT TIME FRAME MAY RESULT IN DELAYS IN RECEIVING YOUR PRESCRIPTION.                    
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
## 2023-07-16 ENCOUNTER — Other Ambulatory Visit: Payer: Self-pay

## 2023-07-16 ENCOUNTER — Ambulatory Visit: Payer: 59 | Attending: Nurse Practitioner

## 2023-07-16 DIAGNOSIS — R531 Weakness: Secondary | ICD-10-CM | POA: Insufficient documentation

## 2023-07-16 DIAGNOSIS — R2689 Other abnormalities of gait and mobility: Secondary | ICD-10-CM | POA: Diagnosis present

## 2023-07-16 DIAGNOSIS — M6281 Muscle weakness (generalized): Secondary | ICD-10-CM | POA: Diagnosis present

## 2023-07-22 ENCOUNTER — Ambulatory Visit: Payer: 59 | Admitting: Physical Medicine & Rehabilitation

## 2023-07-23 ENCOUNTER — Telehealth: Payer: Self-pay

## 2023-07-23 ENCOUNTER — Ambulatory Visit: Payer: 59 | Attending: Nurse Practitioner

## 2023-07-23 NOTE — Telephone Encounter (Signed)
Attempted to call patient regarding missed appointment. No answer and VM box full, unable to leave a message.   1st no show  Gary Ashley, PTA 07/23/23 9:40 AM
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
## 2023-07-27 ENCOUNTER — Ambulatory Visit: Payer: 59
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
## 2023-08-03 ENCOUNTER — Ambulatory Visit: Payer: 59

## 2023-08-10 ENCOUNTER — Ambulatory Visit: Payer: 59
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
## 2023-08-17 ENCOUNTER — Ambulatory Visit: Payer: 59

## 2023-08-19 ENCOUNTER — Encounter: Payer: Self-pay | Admitting: Podiatry

## 2023-08-19 ENCOUNTER — Ambulatory Visit (INDEPENDENT_AMBULATORY_CARE_PROVIDER_SITE_OTHER): Payer: 59 | Admitting: Podiatry

## 2023-08-19 DIAGNOSIS — Q828 Other specified congenital malformations of skin: Secondary | ICD-10-CM | POA: Diagnosis not present

## 2023-08-19 NOTE — Progress Notes (Signed)
Subjective:  Patient ID: Gary Ashley, male    DOB: [birth year],  MRN: [id number]  Chief Complaint  Patient presents with   Callouses    PATIENT WILL LIKE CALLOUSES CUT , NO MEDICATION FOR PAIN    42 y.o. male presents with the above complaint.  Patient presents with left submetatarsal 2 porokeratotic lesion painful to touch is progressive gotten worse worse with ambulation worse with pressure he has not seen anyone as prior to seeing me he would like to discuss treatment options for this pain scale 7 out of 10 dull aching nature   Review of Systems: Negative except as noted in the HPI. Denies N/V/F/Ch.  Past Medical History:  Diagnosis Date   Anxiety    Bilateral pneumothorax    C7 cervical fracture (HCC)    Depression    Hypertension    SAH (subarachnoid hemorrhage) (HCC)    Sinus tachycardia    TBI (traumatic brain injury) (HCC)    Vitamin D deficiency     Current Outpatient Medications:    amLODipine (NORVASC) 10 MG tablet, Take 1 tablet (10 mg total) by mouth daily. FOR BLOOD PRESSURE, Disp: 90 tablet, Rfl: 1   citalopram (CELEXA) 20 MG tablet, Take 3 tablets (60 mg total) by mouth at bedtime., Disp: 90 tablet, Rfl: 6   diclofenac (VOLTAREN) 75 MG EC tablet, Take 1 tablet (75 mg total) by mouth 2 (two) times daily with a meal., Disp: 60 tablet, Rfl: 6   divalproex (DEPAKOTE) 500 MG DR tablet, Take 1 tablet (500 mg total) by mouth 3 (three) times daily., Disp: 90 tablet, Rfl: 6   gabapentin (NEURONTIN) 600 MG tablet, Take 1 tablet (600 mg total) by mouth 3 (three) times daily., Disp: 90 tablet, Rfl: 3   prazosin (MINIPRESS) 2 MG capsule, Take 2 capsules (4 mg total) by mouth at bedtime., Disp: 60 capsule, Rfl: 5   QUEtiapine (SEROQUEL XR) 300 MG 24 hr tablet, Take 1 tablet (300 mg total) by mouth at bedtime., Disp: 30 tablet, Rfl: 6   Vitamin D, Ergocalciferol, (DRISDOL) 1.25 MG (50000 UNIT) CAPS capsule, Take 1 capsule (50,000 Units total) by mouth every 7 (seven) days.  Take once a week., Disp: 12 capsule, Rfl: 0  Social History   Tobacco Use  Smoking Status Some Days   Types: Cigarettes  Smokeless Tobacco Never    Allergies  Allergen Reactions   Penicillins     Swelling Tolerated Cephalosporin 02/22/2019     Objective:  There were no vitals filed for this visit. There is no height or weight on file to calculate BMI. Constitutional Well developed. Well nourished.  Vascular Dorsalis pedis pulses palpable bilaterally. Posterior tibial pulses palpable bilaterally. Capillary refill normal to all digits.  No cyanosis or clubbing noted. Pedal hair growth normal.  Neurologic Normal speech. Oriented to person, place, and time. Epicritic sensation to light touch grossly present bilaterally.  Dermatologic Left submetatarsal 2 porokeratotic lesion with central nucleated core noted.  Pain on palpation to the lesion.  No pinpoint bleeding noted upon debridement  Orthopedic: Normal joint ROM without pain or crepitus bilaterally. No visible deformities. No bony tenderness.   Radiographs: None Assessment:   1. Porokeratosis    Plan:  Patient was evaluated and treated and all questions answered.  Left submet 2 porokeratosis -All questions and concerns were discussed with the patient in extensive detail given the amount of pain that is having a benefit from debridement of the lesion -Using chisel blade handle the lesion was  debrided down to healthy striated tissue.  No complication noted no pinpoint bleeding noted  No follow-ups on file.

## 2023-08-24 ENCOUNTER — Ambulatory Visit: Payer: 59
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
## 2023-09-04 ENCOUNTER — Other Ambulatory Visit: Payer: Self-pay

## 2023-09-04 ENCOUNTER — Encounter (HOSPITAL_COMMUNITY): Payer: Self-pay

## 2023-09-04 MED ORDER — DESCOVY 200-25 MG PO TABS
1.0000 | ORAL_TABLET | Freq: Every day | ORAL | 2 refills | Status: DC
  Filled 2023-09-04: qty 30, 30d supply, fill #0
  Filled 2023-10-19: qty 30, 30d supply, fill #1
  Filled 2023-12-01: qty 30, 30d supply, fill #2

## 2023-09-04 MED ORDER — DOXYCYCLINE HYCLATE 100 MG PO TABS
ORAL_TABLET | ORAL | 2 refills | Status: AC
  Filled 2023-09-10: qty 28, 14d supply, fill #0
  Filled 2024-05-30: qty 28, 14d supply, fill #1

## 2023-09-04 NOTE — Progress Notes (Signed)
Specialty Pharmacy Initial Fill Coordination Note  Gary Ashley is a 42 y.o. male contacted today regarding initial fill of specialty medication(s) Emtricitabine-Tenofovir AF (Descovy)   Patient requested Delivery   Delivery date: 09/08/23   Verified address: [address]   Medication will be filled on 12/23.   Patient is aware of $0 copayment.

## 2023-09-04 NOTE — Progress Notes (Signed)
Specialty Pharmacy Initiation Note   Gary Ashley is a 42 y.o. male who will be followed by the specialty pharmacy service for RxSp HIV PrEP    Review of administration, indication, effectiveness, safety, potential side effects, storage/disposable, and missed dose instructions occurred today for patient's specialty medication(s) Emtricitabine-Tenofovir AF (Descovy)     Patient/Caregiver did not have any additional questions or concerns.   [name] therapy is appropriate to: Initiate    Goals Addressed             This Visit's Progress    Practice safe sex       Patient is initiating therapy. Patient will maintain adherence         Otto Herb Specialty Pharmacist

## 2023-09-07 ENCOUNTER — Other Ambulatory Visit: Payer: Self-pay

## 2023-09-10 ENCOUNTER — Other Ambulatory Visit: Payer: Self-pay

## 2023-09-10 ENCOUNTER — Other Ambulatory Visit (HOSPITAL_COMMUNITY): Payer: Self-pay

## 2023-09-25 ENCOUNTER — Other Ambulatory Visit (HOSPITAL_COMMUNITY): Payer: Self-pay

## 2023-09-28 ENCOUNTER — Other Ambulatory Visit: Payer: Self-pay

## 2023-09-30 ENCOUNTER — Ambulatory Visit (INDEPENDENT_AMBULATORY_CARE_PROVIDER_SITE_OTHER): Payer: 59 | Admitting: Podiatry

## 2023-09-30 ENCOUNTER — Other Ambulatory Visit: Payer: Self-pay

## 2023-09-30 ENCOUNTER — Encounter: Payer: Self-pay | Admitting: Podiatry

## 2023-09-30 DIAGNOSIS — D2372 Other benign neoplasm of skin of left lower limb, including hip: Secondary | ICD-10-CM | POA: Diagnosis not present

## 2023-09-30 DIAGNOSIS — B353 Tinea pedis: Secondary | ICD-10-CM

## 2023-09-30 MED ORDER — CLOTRIMAZOLE-BETAMETHASONE 1-0.05 % EX CREA
1.0000 | TOPICAL_CREAM | Freq: Every day | CUTANEOUS | 2 refills | Status: AC
  Filled 2023-09-30: qty 45, 30d supply, fill #0
  Filled 2024-05-30: qty 45, 30d supply, fill #1

## 2023-09-30 MED ORDER — TERBINAFINE HCL 250 MG PO TABS
250.0000 mg | ORAL_TABLET | Freq: Every day | ORAL | 0 refills | Status: AC
  Filled 2023-09-30: qty 30, 30d supply, fill #0

## 2023-09-30 NOTE — Progress Notes (Signed)
   Chief Complaint  Patient presents with   Routine Post Op    Patient states everything has been going good do far , no pain nor discomfort .     Subjective: 43 y.o. male presenting to the office today for follow-up evaluation of pain and tenderness associated to skin lesions to the plantar aspect of the left foot.  He has not been anything for treatment.  Onset over 1 year ago.  Patient has noted significant improvement.  He continues to apply the salicylic acid.  No new complaints   Past Medical History:  Diagnosis Date   Anxiety    Bilateral pneumothorax    C7 cervical fracture (HCC)    Depression    Hypertension    SAH (subarachnoid hemorrhage) (HCC)    Sinus tachycardia    TBI (traumatic brain injury) (HCC)    Vitamin D  deficiency     Past Surgical History:  Procedure Laterality Date   APPENDECTOMY     EXTERNAL FIXATION LEG Bilateral 02/18/2019   Procedure: EXTERNAL FIXATION RIGHT LEG, I&D LEFT LEG;  Surgeon: Laneta Pintos, MD;  [facility];  Service: Orthopedics;  Laterality: Bilateral;   KNEE ARTHROSCOPY WITH LATERAL MENISECTOMY Right 01/10/2021   Procedure: RIGHT KNEE ARTHROSCOPY WITH LATERAL MENISECTOMY DEBRIDEMENT CHONDROPLASTY  LYSIS OF ADHESIONS WITH MANIPULATION;  Surgeon: Micheline Ahr, MD;  Location: [facility];  Service: Orthopedics;  Laterality: Right;   ORIF TIBIA PLATEAU Right 02/21/2019   Procedure: OPEN REDUCTION INTERNAL FIXATION (ORIF) TIBIAL PLATEAU;  Surgeon: Laneta Pintos, MD;  [facility]: [facility];  Service: Orthopedics;  Laterality: Right;    Allergies  Allergen Reactions   Penicillins     Swelling Tolerated Cephalosporin 02/22/2019       Objective:  Physical Exam General: Alert and oriented x3 in no acute distress  Dermatology: Hyperkeratotic lesion(s) present on the plantar aspect of the left forefoot. Pain on palpation with a central nucleated core noted. Skin is warm, dry and supple bilateral lower extremities.  There is  some diffuse hyperkeratosis of skin with occasional itching.  Concern for possible chronic tinea pedis  Vascular: Palpable pedal pulses bilaterally. No edema or erythema noted. Capillary refill within normal limits.  Neurological: Grossly intact via light touch  Musculoskeletal Exam: Pain on palpation at the keratotic lesion(s) noted. Range of motion within normal limits bilateral. Muscle strength 5/5 in all groups bilateral.  Assessment: 1.  Eccrine poroma left forefoot 2.  Chronic tinea pedis left foot   Plan of Care:  -Patient evaluated -Excisional debridement of keratoic lesion(s) using a chisel blade was performed without incident.  -Salicylic acid applied with a bandaid - Continue salicylic acid to the eccrine poroma of the left forefoot -Prescription for Lamisil  250 mg #30 daily -Prescription for Lotrisone  cream apply twice daily -Return to clinic 6 weeks  Dot Gazella, DPM [facility]  Dr. Dot Gazella, DPM    [address] [place], [place] [address]                Office [phone]  Fax [phone]

## 2023-10-01 ENCOUNTER — Other Ambulatory Visit: Payer: Self-pay

## 2023-10-19 ENCOUNTER — Other Ambulatory Visit: Payer: Self-pay

## 2023-10-19 ENCOUNTER — Other Ambulatory Visit (HOSPITAL_COMMUNITY): Payer: Self-pay

## 2023-10-19 NOTE — Progress Notes (Signed)
Specialty Pharmacy Refill Coordination Note  Gary Ashley is a 43 y.o. male contacted today regarding refills of specialty medication(s) Emtricitabine-Tenofovir AF (Descovy)   Patient requested Delivery   Delivery date: 10/20/23   Verified address: [address]   Medication will be filled on 10/19/23.

## 2023-10-28 ENCOUNTER — Encounter: Payer: 59 | Admitting: Psychology

## 2023-11-05 ENCOUNTER — Encounter: Payer: 59 | Admitting: Psychology

## 2023-11-10 ENCOUNTER — Other Ambulatory Visit: Payer: Self-pay

## 2023-11-10 ENCOUNTER — Other Ambulatory Visit (HOSPITAL_COMMUNITY): Payer: Self-pay

## 2023-11-11 ENCOUNTER — Encounter: Payer: Self-pay | Admitting: Podiatry

## 2023-11-11 ENCOUNTER — Ambulatory Visit (INDEPENDENT_AMBULATORY_CARE_PROVIDER_SITE_OTHER): Payer: 59 | Admitting: Podiatry

## 2023-11-11 DIAGNOSIS — B353 Tinea pedis: Secondary | ICD-10-CM

## 2023-11-11 DIAGNOSIS — D2372 Other benign neoplasm of skin of left lower limb, including hip: Secondary | ICD-10-CM
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
## 2023-11-13 ENCOUNTER — Telehealth: Payer: Self-pay | Admitting: Podiatry

## 2023-11-13 ENCOUNTER — Other Ambulatory Visit: Payer: Self-pay

## 2023-11-13 ENCOUNTER — Other Ambulatory Visit: Payer: Self-pay | Admitting: Podiatry

## 2023-11-13 ENCOUNTER — Other Ambulatory Visit (HOSPITAL_COMMUNITY): Payer: Self-pay

## 2023-11-13 MED ORDER — CLOTRIMAZOLE-BETAMETHASONE 1-0.05 % EX CREA
1.0000 | TOPICAL_CREAM | Freq: Every day | CUTANEOUS | 0 refills | Status: AC
  Filled 2023-11-13: qty 45, 30d supply, fill #0

## 2023-11-13 NOTE — Progress Notes (Signed)
Rx for lotrisone sent

## 2023-11-13 NOTE — Telephone Encounter (Signed)
 Pt called back and spoke to [facility] and I told her to let him know prescription was sent in. She also let him know his voicemail is full.

## 2023-11-13 NOTE — Telephone Encounter (Signed)
 Called pt to advise medication was sent in but mailbox is full so not able to leave message.

## 2023-11-13 NOTE — Telephone Encounter (Signed)
 Pt left message today at 1235pm for someone to call him back.  I returned call and he said he seen Dr Logan Bores this week and he was to have called in some cream for him and the [place] pharmacy did not get anything. Please send rx's to the pharmacy.

## 2023-11-16 ENCOUNTER — Other Ambulatory Visit (HOSPITAL_COMMUNITY): Payer: Self-pay

## 2023-12-01 ENCOUNTER — Other Ambulatory Visit: Payer: Self-pay

## 2023-12-01 ENCOUNTER — Other Ambulatory Visit (HOSPITAL_COMMUNITY): Payer: Self-pay

## 2023-12-01 NOTE — Progress Notes (Signed)
 Specialty Pharmacy Refill Coordination Note  Gary Ashley is a 43 y.o. male contacted today regarding refills of specialty medication(s) Emtricitabine-Tenofovir AF (Descovy)   Patient requested Delivery   Delivery date: 12/03/23   Verified address: [address]   Medication will be filled on 12/02/23.

## 2023-12-02 ENCOUNTER — Other Ambulatory Visit: Payer: Self-pay

## 2023-12-16 ENCOUNTER — Other Ambulatory Visit (HOSPITAL_COMMUNITY): Payer: Self-pay

## 2023-12-21 ENCOUNTER — Other Ambulatory Visit: Payer: Self-pay

## 2023-12-21 NOTE — Progress Notes (Signed)
 Specialty Pharmacy Refill Coordination Note  Gary Ashley is a 43 y.o. male contacted today regarding refills of specialty medication(s) Emtricitabine-Tenofovir AF (Descovy)   Patient requested Delivery   Delivery date: 12/31/23   Verified address: [address]   Medication will be filled on 04.07.25.

## 2023-12-22 ENCOUNTER — Other Ambulatory Visit: Payer: Self-pay

## 2023-12-22 MED ORDER — EMTRICITABINE-TENOFOVIR AF 200-25 MG PO TABS
1.0000 | ORAL_TABLET | Freq: Every day | ORAL | 2 refills | Status: AC
  Filled 2023-12-22: qty 30, 30d supply, fill #0
  Filled 2024-01-21 (×2): qty 30, 30d supply, fill #1
  Filled 2024-02-26: qty 30, 30d supply, fill #2

## 2023-12-23 ENCOUNTER — Ambulatory Visit (INDEPENDENT_AMBULATORY_CARE_PROVIDER_SITE_OTHER): Payer: 59 | Admitting: Podiatry

## 2023-12-23 DIAGNOSIS — B353 Tinea pedis: Secondary | ICD-10-CM

## 2023-12-23 DIAGNOSIS — D2372 Other benign neoplasm of skin of left lower limb, including hip: Secondary | ICD-10-CM | POA: Diagnosis not present
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
## 2023-12-30 ENCOUNTER — Other Ambulatory Visit: Payer: Self-pay

## 2024-01-13 ENCOUNTER — Encounter: Payer: 59 | Attending: Physical Medicine & Rehabilitation | Admitting: Physical Medicine & Rehabilitation

## 2024-01-13 ENCOUNTER — Encounter: Payer: Self-pay | Admitting: Physical Medicine & Rehabilitation

## 2024-01-13 VITALS — BP 142/92 | HR 65 | Ht 70.0 in

## 2024-01-13 DIAGNOSIS — G8929 Other chronic pain: Secondary | ICD-10-CM | POA: Diagnosis present

## 2024-01-13 DIAGNOSIS — M25562 Pain in left knee: Secondary | ICD-10-CM | POA: Insufficient documentation
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
## 2024-01-13 NOTE — Patient Instructions (Signed)
 ALWAYS FEEL FREE TO CALL OUR OFFICE WITH ANY PROBLEMS OR QUESTIONS [phone])  **PLEASE NOTE** ALL MEDICATION REFILL REQUESTS (INCLUDING CONTROLLED SUBSTANCES) NEED TO BE MADE AT LEAST 7 DAYS PRIOR TO REFILL BEING DUE. ANY REFILL REQUESTS INSIDE THAT TIME FRAME MAY RESULT IN DELAYS IN RECEIVING YOUR PRESCRIPTION.

## 2024-01-21 ENCOUNTER — Other Ambulatory Visit (HOSPITAL_COMMUNITY): Payer: Self-pay

## 2024-01-21 ENCOUNTER — Other Ambulatory Visit: Payer: Self-pay

## 2024-01-21 NOTE — Progress Notes (Signed)
 Specialty Pharmacy Refill Coordination Note  Gary Ashley is a 43 y.o. male contacted today regarding refills of specialty medication(s) Descovy .  Patient requested (Patient-Rptd) Delivery   Delivery date: (Patient-Rptd) 02/02/24   Verified address: (Patient-Rptd) [address] [place] [address]   Medication will be filled on 02/01/24.

## 2024-01-27 ENCOUNTER — Other Ambulatory Visit: Payer: Self-pay

## 2024-01-27 ENCOUNTER — Other Ambulatory Visit (HOSPITAL_COMMUNITY): Payer: Self-pay

## 2024-01-27 MED ORDER — DESCOVY 200-25 MG PO TABS
1.0000 | ORAL_TABLET | Freq: Every day | ORAL | 1 refills | Status: AC
  Filled 2024-01-27: qty 90, 90d supply, fill #0
  Filled 2024-03-23: qty 30, 30d supply, fill #0
  Filled 2024-04-22 – 2024-04-25 (×2): qty 30, 30d supply, fill #1
  Filled 2024-05-23 – 2024-07-05 (×4): qty 30, 30d supply, fill #2
  Filled 2024-07-29 – 2024-09-30 (×4): qty 30, 30d supply, fill #3

## 2024-01-27 MED ORDER — DOXYCYCLINE HYCLATE 100 MG PO TABS
ORAL_TABLET | ORAL | 2 refills | Status: AC
  Filled 2024-01-27: qty 28, 30d supply, fill #0

## 2024-01-28 ENCOUNTER — Other Ambulatory Visit (HOSPITAL_COMMUNITY): Payer: Self-pay

## 2024-02-01 ENCOUNTER — Other Ambulatory Visit: Payer: Self-pay

## 2024-02-02 ENCOUNTER — Other Ambulatory Visit (HOSPITAL_COMMUNITY): Payer: Self-pay

## 2024-02-02 MED ORDER — MELOXICAM 7.5 MG PO TABS
7.5000 mg | ORAL_TABLET | Freq: Two times a day (BID) | ORAL | 2 refills | Status: DC
  Filled 2024-02-02: qty 60, 30d supply, fill #0
  Filled 2024-05-30: qty 60, 30d supply, fill #1

## 2024-02-26 ENCOUNTER — Other Ambulatory Visit: Payer: Self-pay

## 2024-02-26 ENCOUNTER — Encounter (INDEPENDENT_AMBULATORY_CARE_PROVIDER_SITE_OTHER): Payer: Self-pay

## 2024-02-26 NOTE — Progress Notes (Signed)
 Specialty Pharmacy Ongoing Clinical Assessment Note  Gary Ashley is a 43 y.o. male who is being followed by the specialty pharmacy service for RxSp HIV PrEP   [name] specialty medication(s) reviewed today: Emtricitabine -Tenofovir  AF (Descovy )   Missed doses in the last 4 weeks: 0   Patient/Caregiver did not have any additional questions or concerns.   Therapeutic benefit summary: Patient is achieving benefit   Adverse events/side effects summary: No adverse events/side effects   [name] therapy is appropriate to: Continue    Goals Addressed             This Visit's Progress    Practice safe sex       Patient is on track. Patient will maintain adherence          Follow up: 12 months  Gary Ashley M Darria Corvera Specialty Pharmacist

## 2024-02-26 NOTE — Progress Notes (Signed)
 Patient Satisfaction Survey Complete.

## 2024-02-26 NOTE — Progress Notes (Signed)
 Specialty Pharmacy Refill Coordination Note  Gary Ashley is a 43 y.o. male contacted today regarding refills of specialty medication(s) Emtricitabine -Tenofovir  AF (Descovy )   Patient requested Delivery   Delivery date: 02/29/24   Verified address: [address]   [place] [address]   Medication will be filled on 02/26/24.

## 2024-03-22 ENCOUNTER — Encounter (INDEPENDENT_AMBULATORY_CARE_PROVIDER_SITE_OTHER): Payer: Self-pay

## 2024-03-23 ENCOUNTER — Other Ambulatory Visit: Payer: Self-pay

## 2024-03-23 ENCOUNTER — Other Ambulatory Visit: Payer: Self-pay | Admitting: Pharmacy Technician

## 2024-03-23 NOTE — Progress Notes (Signed)
 Specialty Pharmacy Refill Coordination Note  Gary Ashley is a 43 y.o. male contacted today regarding refills of specialty medication(s) Emtricitabine -Tenofovir  AF (Descovy )   Patient requested (Patient-Rptd) Delivery   Delivery date: 03/29/24 Verified address: (Patient-Rptd) [address] [name] [address]   Medication will be filled on 03/28/2024.

## 2024-04-06 ENCOUNTER — Encounter: Payer: 59 | Admitting: Psychology

## 2024-04-20 ENCOUNTER — Other Ambulatory Visit: Payer: Self-pay

## 2024-04-22 ENCOUNTER — Other Ambulatory Visit: Payer: Self-pay

## 2024-04-22 ENCOUNTER — Encounter (INDEPENDENT_AMBULATORY_CARE_PROVIDER_SITE_OTHER): Payer: Self-pay

## 2024-04-25 ENCOUNTER — Other Ambulatory Visit: Payer: Self-pay

## 2024-04-25 ENCOUNTER — Other Ambulatory Visit (HOSPITAL_COMMUNITY): Payer: Self-pay

## 2024-04-25 NOTE — Progress Notes (Signed)
 Specialty Pharmacy Refill Coordination Note  MyChart Questionnaire Submission  Gary Ashley is a 43 y.o. male contacted today regarding refills of specialty medication(s) Descovy .  Doses on hand: 15 tablets (15 days)   Patient requested: Delivery   Delivery date: 04/27/24   Verified address: [address] Gary [place]  Medication will be filled on 04/26/24.

## 2024-05-04 ENCOUNTER — Ambulatory Visit

## 2024-05-09 ENCOUNTER — Telehealth: Payer: Self-pay | Admitting: Nurse Practitioner

## 2024-05-09 NOTE — Telephone Encounter (Signed)
 Contacted pt mail is full to leave a message to let him know we do not have any flu vaccine ready at the clinic as of yet. appt cancel

## 2024-05-11 ENCOUNTER — Ambulatory Visit

## 2024-05-23 ENCOUNTER — Other Ambulatory Visit: Payer: Self-pay

## 2024-05-25 ENCOUNTER — Other Ambulatory Visit: Payer: Self-pay

## 2024-05-30 ENCOUNTER — Other Ambulatory Visit (HOSPITAL_COMMUNITY): Payer: Self-pay

## 2024-06-28 ENCOUNTER — Other Ambulatory Visit: Payer: Self-pay

## 2024-06-28 ENCOUNTER — Other Ambulatory Visit (HOSPITAL_COMMUNITY): Payer: Self-pay

## 2024-07-05 ENCOUNTER — Other Ambulatory Visit: Payer: Self-pay

## 2024-07-05 NOTE — Progress Notes (Signed)
 Specialty Pharmacy Refill Coordination Note  Gary Ashley is a 43 y.o. male contacted today regarding refills of specialty medication(s) Emtricitabine -Tenofovir  AF (Descovy )   Patient requested Delivery   Delivery date: 07/07/24   Verified address: [address] [name] [address]   Medication will be filled on 07/06/24.

## 2024-07-20 ENCOUNTER — Encounter: Admitting: Physical Medicine & Rehabilitation

## 2024-07-29 ENCOUNTER — Other Ambulatory Visit: Payer: Self-pay

## 2024-08-02 ENCOUNTER — Other Ambulatory Visit (HOSPITAL_COMMUNITY): Payer: Self-pay

## 2024-08-04 ENCOUNTER — Other Ambulatory Visit: Payer: Self-pay

## 2024-08-09 ENCOUNTER — Other Ambulatory Visit: Payer: Self-pay

## 2024-08-09 ENCOUNTER — Other Ambulatory Visit (HOSPITAL_COMMUNITY): Payer: Self-pay

## 2024-08-09 MED ORDER — DESCOVY 200-25 MG PO TABS
1.0000 | ORAL_TABLET | Freq: Every day | ORAL | 1 refills | Status: AC
  Filled 2024-08-10: qty 30, 30d supply, fill #0

## 2024-08-09 MED ORDER — DOXYCYCLINE HYCLATE 100 MG PO TABS
ORAL_TABLET | ORAL | 2 refills | Status: AC
  Filled 2024-08-09: qty 28, 14d supply, fill #0
  Filled 2024-09-30: qty 28, 14d supply, fill #1

## 2024-08-10 ENCOUNTER — Other Ambulatory Visit: Payer: Self-pay

## 2024-08-10 ENCOUNTER — Other Ambulatory Visit (HOSPITAL_COMMUNITY): Payer: Self-pay

## 2024-08-10 NOTE — Progress Notes (Signed)
 Specialty Pharmacy Refill Coordination Note  Gary Ashley is a 43 y.o. male contacted today regarding refills of specialty medication(s) Emtricitabine -Tenofovir  AF (Descovy )   Patient requested Delivery   Delivery date: 08/16/24   Verified address: [address]   Medication will be filled on: 08/15/24

## 2024-08-15 ENCOUNTER — Other Ambulatory Visit: Payer: Self-pay

## 2024-08-17 ENCOUNTER — Encounter: Admitting: Psychology

## 2024-09-06 ENCOUNTER — Other Ambulatory Visit: Payer: Self-pay

## 2024-09-07 ENCOUNTER — Encounter: Admitting: Physical Medicine & Rehabilitation

## 2024-09-07 ENCOUNTER — Other Ambulatory Visit: Payer: Self-pay

## 2024-09-09 ENCOUNTER — Other Ambulatory Visit (HOSPITAL_COMMUNITY): Payer: Self-pay

## 2024-09-13 ENCOUNTER — Other Ambulatory Visit (HOSPITAL_COMMUNITY): Payer: Self-pay

## 2024-09-26 ENCOUNTER — Telehealth: Payer: Self-pay | Admitting: Nurse Practitioner

## 2024-09-26 NOTE — Telephone Encounter (Signed)
 Pt confirmed appt (both 8:30 and 9:10

## 2024-09-27 ENCOUNTER — Ambulatory Visit: Attending: Nurse Practitioner

## 2024-09-27 ENCOUNTER — Ambulatory Visit: Payer: Self-pay | Admitting: Nurse Practitioner

## 2024-09-27 VITALS — Ht 70.0 in | Wt 136.0 lb

## 2024-09-27 DIAGNOSIS — Z0000 Encounter for general adult medical examination without abnormal findings: Secondary | ICD-10-CM | POA: Diagnosis not present

## 2024-09-27 NOTE — Progress Notes (Signed)
 "  Chief Complaint  Patient presents with   Medicare Wellness    SUBSEQUENT     Subjective:   Gary Ashley is a 44 y.o. male who presents for a Medicare Annual Wellness Visit.  Visit info / Clinical Intake: Medicare Wellness Visit Type:: Subsequent Annual Wellness Visit Persons participating in visit and providing information:: patient Medicare Wellness Visit Mode:: In-person (required for WTM) Interpreter Needed?: No Pre-visit prep was completed: yes AWV questionnaire completed by patient prior to visit?: no Living arrangements:: (!) lives alone [name] Overall Health Status Rating: (!) fair Typical amount of pain: some Does pain affect daily life?: (!) yes Are you currently prescribed opioids?: no  Dietary Habits and Nutritional Risks How many meals a day?: 3 Eats fruit and vegetables daily?: yes Most meals are obtained by: preparing own meals In the last 2 weeks, have you had any of the following?: none Diabetic:: no  Functional Status Activities of Daily Living (to include ambulation/medication): Independent Ambulation: Independent with device- listed below Home Assistive Devices/Equipment: Shower/tub chair Medication Administration: Independent Home Management (perform basic housework or laundry): Needs assistance (comment) Manage your own finances?: yes Primary transportation is: facility / other Concerns about vision?: (!) yes (right eye blurriness) Concerns about hearing?: no  Fall Screening Falls in the past year?: 0 Number of falls in past year: 0 Was there an injury with Fall?: 0 Fall Risk Category Calculator: 0 Patient Fall Risk Level: Low Fall Risk  Fall Risk Patient at Risk for Falls Due to: No Fall Risks Fall risk Follow up: Falls evaluation completed; Education provided  Home and Transportation Safety: All rugs have non-skid backing?: N/A, no rugs All stairs or steps have railings?: N/A, no stairs (front entry only) Grab bars in the  bathtub or shower?: yes Have non-skid surface in bathtub or shower?: yes Good home lighting?: yes Regular seat belt use?: yes Hospital stays in the last year:: no  Cognitive Assessment Difficulty concentrating, remembering, or making decisions? : yes Will 6CIT or Mini Cog be Completed: yes What year is it?: 0 points What month is it?: 0 points Give patient an address phrase to remember (5 components): [address] [place] About what time is it?: 0 points Count backwards from 20 to 1: 0 points Say the months of the year in reverse: 0 points Repeat the address phrase from earlier: 0 points 6 CIT Score: 0 points  Advance Directives (For Healthcare) Does Patient Have a Medical Advance Directive?: No Would patient like information on creating a medical advance directive?: No - Patient declined  Reviewed/Updated  Reviewed/Updated: Reviewed All (Medical, Surgical, Family, Medications, Allergies, Care Teams, Patient Goals)    Allergies (verified) Penicillins   Current Medications (verified) Outpatient Encounter Medications as of 09/27/2024  Medication Sig   amLODipine  (NORVASC ) 10 MG tablet Take 1 tablet (10 mg total) by mouth daily. FOR BLOOD PRESSURE   citalopram  (CELEXA ) 20 MG tablet Take 3 tablets (60 mg total) by mouth at bedtime.   clotrimazole -betamethasone  (LOTRISONE ) cream Apply 1 Application topically daily. (Patient not taking: Reported on 09/27/2024)   clotrimazole -betamethasone  (LOTRISONE ) cream Apply 1 Application topically daily. (Patient not taking: Reported on 09/27/2024)   diclofenac  (VOLTAREN ) 75 MG EC tablet Take 1 tablet (75 mg total) by mouth 2 (two) times daily with a meal.   divalproex  (DEPAKOTE ) 500 MG DR tablet Take 1 tablet (500 mg total) by mouth 3 (three) times daily.   doxycycline  (VIBRA -TABS) 100 MG tablet Take 2 tablets in a single dose  as directed within 24 hours, but no later than 72 hours after intercourse, not to exceed 2 tablets in 24 hours.    doxycycline  (VIBRA -TABS) 100 MG tablet Take 2 tablets in a single dose as directed within 24 hours, but no later than 72 hours after intercourse, not to exceed 2 tablets in 24 hours.   doxycycline  (VIBRA -TABS) 100 MG tablet Take 2 tablets in a single dose as directed within 24 hours, but no later than 72 hours after intercourse, not to exceed 2 tablets in 24 hours.   emtricitabine -tenofovir  AF (DESCOVY ) 200-25 MG tablet Take 1 tablet by mouth daily.   emtricitabine -tenofovir  AF (DESCOVY ) 200-25 MG tablet Take 1 tablet by mouth daily.   emtricitabine -tenofovir  AF (DESCOVY ) 200-25 MG tablet Take 1 tablet by mouth daily.   gabapentin  (NEURONTIN ) 600 MG tablet Take 1 tablet (600 mg total) by mouth 3 (three) times daily.   meloxicam  (MOBIC ) 7.5 MG tablet Take one tablet by mouth twice a day with meals   prazosin  (MINIPRESS ) 2 MG capsule Take 2 capsules (4 mg total) by mouth at bedtime.   QUEtiapine  (SEROQUEL  XR) 300 MG 24 hr tablet Take 1 tablet (300 mg total) by mouth at bedtime.   terbinafine  (LAMISIL ) 250 MG tablet Take 1 tablet (250 mg total) by mouth daily.   Vitamin D , Ergocalciferol , (DRISDOL ) 1.25 MG (50000 UNIT) CAPS capsule Take 1 capsule (50,000 Units total) by mouth every 7 (seven) days. Take once a week.   No facility-administered encounter medications on file as of 09/27/2024.    History: Past Medical History:  Diagnosis Date   Anxiety    Bilateral pneumothorax    C7 cervical fracture (HCC)    Depression    Hypertension    SAH (subarachnoid hemorrhage) (HCC)    Sinus tachycardia    TBI (traumatic brain injury) (HCC)    Vitamin D  deficiency    Past Surgical History:  Procedure Laterality Date   APPENDECTOMY     EXTERNAL FIXATION LEG Bilateral 02/18/2019   Procedure: EXTERNAL FIXATION RIGHT LEG, I&D LEFT LEG;  Surgeon: Kendal Franky SQUIBB, MD;  Location: [facility] OR;  Service: Orthopedics;  Laterality: Bilateral;   KNEE ARTHROSCOPY WITH LATERAL MENISECTOMY Right 01/10/2021   Procedure:  RIGHT KNEE ARTHROSCOPY WITH LATERAL MENISECTOMY DEBRIDEMENT CHONDROPLASTY  LYSIS OF ADHESIONS WITH MANIPULATION;  Surgeon: Cristy Bonner DASEN, MD;  Location: [facility];  Service: Orthopedics;  Laterality: Right;   ORIF TIBIA PLATEAU Right 02/21/2019   Procedure: OPEN REDUCTION INTERNAL FIXATION (ORIF) TIBIAL PLATEAU;  Surgeon: Kendal Franky SQUIBB, MD;  Location: [facility] OR;  Service: Orthopedics;  Laterality: Right;   Family History  Problem Relation Age of Onset   Healthy Mother    Healthy Father    Colon cancer Neg Hx    Stomach cancer Neg Hx    Esophageal cancer Neg Hx    Colon polyps Neg Hx    Social History   Occupational History   Occupation: [employer]  Tobacco Use   Smoking status: Some Days    Types: Cigarettes   Smokeless tobacco: Never  Vaping Use   Vaping status: Never Used  Substance and Sexual Activity   Alcohol use: Not Currently   Drug use: Yes    Types: Marijuana    Comment: sometimes   Sexual activity: Yes   Tobacco Counseling Ready to quit: Not Answered Counseling given: Not Answered  SDOH Screenings   Food Insecurity: Food Insecurity Present (09/27/2024)  Housing: Low Risk (09/27/2024)  Transportation Needs: No Transportation  Needs (09/27/2024)  Utilities: Not At Risk (09/27/2024)  Alcohol Screen: Low Risk (09/27/2024)  Depression (PHQ2-9): Low Risk (09/27/2024)  Financial Resource Strain: Low Risk (09/27/2024)  Physical Activity: Sufficiently Active (09/27/2024)  Social Connections: Moderately Integrated (09/27/2024)  Stress: Stress Concern Present (09/27/2024)  Tobacco Use: High Risk (09/27/2024)  Health Literacy: Adequate Health Literacy (09/27/2024)   See flowsheets for full screening details  Depression Screen PHQ 2 & 9 Depression Scale- Over the past 2 weeks, how often have you been bothered by any of the following problems? Little interest or pleasure in doing things: 0 Feeling down, depressed, or hopeless (PHQ Adolescent also includes...irritable):  0 PHQ-2 Total Score: 0 Trouble falling or staying asleep, or sleeping too much: 0 Feeling tired or having little energy: 0 Poor appetite or overeating (PHQ Adolescent also includes...weight loss): 0 Feeling bad about yourself - or that you are a failure or have let yourself or your family down: 0 Trouble concentrating on things, such as reading the newspaper or watching television (PHQ Adolescent also includes...like school work): 0 Moving or speaking so slowly that other people could have noticed. Or the opposite - being so fidgety or restless that you have been moving around a lot more than usual: 0 Thoughts that you would be better off dead, or of hurting yourself in some way: 0 PHQ-9 Total Score: 0 If you checked off any problems, how difficult have these problems made it for you to do your work, take care of things at home, or get along with other people?: Not difficult at all  Depression Treatment Depression Interventions/Treatment : EYV7-0 Score <4 Follow-up Not Indicated     Goals Addressed             This Visit's Progress    09/27/2024: To remain independent and physically active in life.               Objective:    Today's Vitals   09/27/24 0841  Weight: 136 lb (61.7 kg)  Height: 5' 10 (1.778 m)  PainSc: 6   PainLoc: Leg   Body mass index is 19.51 kg/m.  Hearing/Vision screen Hearing Screening - Comments:: Adequate hearing. Vision Screening - Comments:: Right eye blurry; no eyeglasses. Immunizations and Health Maintenance Health Maintenance  Topic Date Due   COVID-19 Vaccine (1) Never done   Pneumococcal Vaccine (1 of 2 - PCV) Never done   Hepatitis B Vaccines 19-59 Average Risk (1 of 3 - 19+ 3-dose series) Never done   HPV VACCINES (1 - Risk 3-dose SCDM series) Never done   Influenza Vaccine  04/15/2024   Medicare Annual Wellness (AWV)  09/27/2025   DTaP/Tdap/Td (2 - Td or Tdap) 02/18/2029   Hepatitis C Screening  Completed   HIV Screening   Completed   Meningococcal B Vaccine  Aged Out        Assessment/Plan:  This is a routine wellness examination for [place].  Patient Care Team: Theotis Haze ORN, NP as PCP - General (Nurse Practitioner) Babs Arthea DASEN, MD as Consulting Physician (Physical Medicine and Rehabilitation) Corina Norleen SAUNDERS, PsyD as Consulting Physician (Psychology) Janit Thresa HERO, DPM as Consulting Physician (Podiatry)  I have personally reviewed and noted the following in the patients chart:   Medical and social history Use of alcohol, tobacco or illicit drugs  Current medications and supplements including opioid prescriptions. Functional ability and status Nutritional status Physical activity Advanced directives List of other physicians Hospitalizations, surgeries, and ER visits in previous 12 months Vitals  Screenings to include cognitive, depression, and falls Referrals and appointments  No orders of the defined types were placed in this encounter.  In addition, I have reviewed and discussed with patient certain preventive protocols, quality metrics, and best practice recommendations. A written personalized care plan for preventive services as well as general preventive health recommendations were provided to patient.   Roz LOISE Fuller, LPN   [id number]/7973   Return in 1 year (on 09/27/2025).  After Visit Summary: (In Person-Printed) AVS printed and given to the patient  Nurse Notes: Patient aware of current care gaps.  Patient stated that he will wait until his office visit to get flu vaccine. "

## 2024-09-27 NOTE — Patient Instructions (Signed)
 Mr. Pasquariello,  Thank you for taking the time for your Medicare Wellness Visit. I appreciate your continued commitment to your health goals. Please review the care plan we discussed, and feel free to reach out if I can assist you further.  Please note that Annual Wellness Visits do not include a physical exam. Some assessments may be limited, especially if the visit was conducted virtually. If needed, we may recommend an in-person follow-up with your provider.  Ongoing Care Seeing your primary care provider every 3 to 6 months helps us  monitor your health and provide consistent, personalized care.   Referrals If a referral was made during today's visit and you haven't received any updates within two weeks, please contact the referred provider directly to check on the status.  Recommended Screenings:  Health Maintenance  Topic Date Due   COVID-19 Vaccine (1) Never done   Pneumococcal Vaccine (1 of 2 - PCV) Never done   Hepatitis B Vaccine (1 of 3 - 19+ 3-dose series) Never done   HPV Vaccine (1 - Risk 3-dose SCDM series) Never done   Medicare Annual Wellness Visit  02/25/2024   Flu Shot  04/15/2024   DTaP/Tdap/Td vaccine (2 - Td or Tdap) 02/18/2029   Hepatitis C Screening  Completed   HIV Screening  Completed   Meningitis B Vaccine  Aged Out       09/27/2024    8:43 AM  Advanced Directives  Does Patient Have a Medical Advance Directive? No    Vision: Annual vision screenings are recommended for early detection of glaucoma, cataracts, and diabetic retinopathy. These exams can also reveal signs of chronic conditions such as diabetes and high blood pressure.  Dental: Annual dental screenings help detect early signs of oral cancer, gum disease, and other conditions linked to overall health, including heart disease and diabetes.  Please see the attached documents for additional preventive care recommendations.

## 2024-09-29 ENCOUNTER — Other Ambulatory Visit: Payer: Self-pay

## 2024-09-29 ENCOUNTER — Ambulatory Visit: Attending: *Deleted | Admitting: *Deleted

## 2024-09-29 ENCOUNTER — Encounter: Payer: Self-pay | Admitting: *Deleted

## 2024-09-29 VITALS — BP 120/83 | HR 81 | Ht 70.0 in | Wt 128.8 lb

## 2024-09-29 DIAGNOSIS — E559 Vitamin D deficiency, unspecified: Secondary | ICD-10-CM

## 2024-09-29 DIAGNOSIS — G8929 Other chronic pain: Secondary | ICD-10-CM

## 2024-09-29 DIAGNOSIS — I10 Essential (primary) hypertension: Secondary | ICD-10-CM

## 2024-09-29 DIAGNOSIS — Z23 Encounter for immunization: Secondary | ICD-10-CM

## 2024-09-29 MED ORDER — MELOXICAM 7.5 MG PO TABS
7.5000 mg | ORAL_TABLET | Freq: Every day | ORAL | 0 refills | Status: AC
  Filled 2024-09-29: qty 100, 100d supply, fill #0
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
## 2024-09-29 NOTE — Patient Instructions (Addendum)
 Today we discussed  your overall health. Anxiety: Doing well History of bilateral pneumothorax: No significant respiratory difficulty History of C7 cervical fracture: Moves all extremities.  Uses single-point cane or walker as needed for safe ambulation Depression on Celexa  and Seroquel : Stable with no suicidal or homicidal ideation.  Continue follow-ups with provider Hypertension: Blood pressure 120/90 on amlodipine  dose.  Continue this medication and adhere to a heart healthy diet and exercise daily History of sinus tachycardia: Occasional palpitation.  Notify the office if these persist or worsen TBI with cognitive and behavioral deficit on Depakote  by PMR.  Continue this medication and follow-up. History of vitamin D  deficiency: Will add vitamin D  level to labs for today. He did request refill of prescription of meloxicam .  This will be sent to [facility] for delivery. Okay for flu shot

## 2024-09-29 NOTE — Assessment & Plan Note (Addendum)
 History of vitamin D  deficiency in the past. Does not take his vitamin D  at this time. Wonders if his vitamin D  level is okay. Okay to check vitamin D  level today with annual labs Orders:   VITAMIN D  25 Hydroxy (Vit-D Deficiency, Fractures)

## 2024-09-30 ENCOUNTER — Other Ambulatory Visit: Payer: Self-pay | Admitting: Pharmacy Technician

## 2024-09-30 ENCOUNTER — Other Ambulatory Visit (HOSPITAL_COMMUNITY): Payer: Self-pay

## 2024-09-30 ENCOUNTER — Other Ambulatory Visit: Payer: Self-pay

## 2024-09-30 ENCOUNTER — Other Ambulatory Visit: Payer: Self-pay | Admitting: *Deleted

## 2024-09-30 ENCOUNTER — Ambulatory Visit: Payer: Self-pay | Admitting: *Deleted

## 2024-09-30 DIAGNOSIS — E559 Vitamin D deficiency, unspecified: Secondary | ICD-10-CM

## 2024-09-30 LAB — CMP14+EGFR
ALT: 18 IU/L (ref 0–44)
AST: 24 IU/L (ref 0–40)
Albumin: 4.6 g/dL (ref 4.1–5.1)
Alkaline Phosphatase: 49 IU/L (ref 47–123)
BUN/Creatinine Ratio: 12 (ref 9–20)
BUN: 12 mg/dL (ref 6–24)
Bilirubin Total: 0.2 mg/dL (ref 0.0–1.2)
CO2: 25 mmol/L (ref 20–29)
Calcium: 9.3 mg/dL (ref 8.7–10.2)
Chloride: 100 mmol/L (ref 96–106)
Creatinine, Ser: 0.97 mg/dL (ref 0.76–1.27)
Globulin, Total: 2.9 g/dL (ref 1.5–4.5)
Glucose: 101 mg/dL — ABNORMAL HIGH (ref 70–99)
Potassium: 4.3 mmol/L (ref 3.5–5.2)
Sodium: 140 mmol/L (ref 134–144)
Total Protein: 7.5 g/dL (ref 6.0–8.5)
eGFR: 99 mL/min/1.73

## 2024-09-30 LAB — CBC WITH DIFFERENTIAL/PLATELET
Basophils Absolute: 0 x10E3/uL (ref 0.0–0.2)
Basos: 0 %
EOS (ABSOLUTE): 0 x10E3/uL (ref 0.0–0.4)
Eos: 0 %
Hematocrit: 49.7 % (ref 37.5–51.0)
Hemoglobin: 16 g/dL (ref 13.0–17.7)
Immature Grans (Abs): 0 x10E3/uL (ref 0.0–0.1)
Immature Granulocytes: 0 %
Lymphocytes Absolute: 0.7 x10E3/uL (ref 0.7–3.1)
Lymphs: 13 %
MCH: 32.3 pg (ref 26.6–33.0)
MCHC: 32.2 g/dL (ref 31.5–35.7)
MCV: 100 fL — ABNORMAL HIGH (ref 79–97)
Monocytes Absolute: 0.6 x10E3/uL (ref 0.1–0.9)
Monocytes: 12 %
Neutrophils Absolute: 3.9 x10E3/uL (ref 1.4–7.0)
Neutrophils: 75 %
Platelets: 190 x10E3/uL (ref 150–450)
RBC: 4.96 x10E6/uL (ref 4.14–5.80)
RDW: 13.1 % (ref 11.6–15.4)
WBC: 5.2 x10E3/uL (ref 3.4–10.8)

## 2024-09-30 LAB — LIPID PANEL
Chol/HDL Ratio: 2.3 ratio (ref 0.0–5.0)
Cholesterol, Total: 128 mg/dL (ref 100–199)
HDL: 56 mg/dL
LDL Chol Calc (NIH): 58 mg/dL (ref 0–99)
Triglycerides: 68 mg/dL (ref 0–149)
VLDL Cholesterol Cal: 14 mg/dL (ref 5–40)

## 2024-09-30 LAB — VITAMIN D 25 HYDROXY (VIT D DEFICIENCY, FRACTURES): Vit D, 25-Hydroxy: 6.8 ng/mL — ABNORMAL LOW (ref 30.0–100.0)

## 2024-09-30 MED ORDER — VITAMIN D (ERGOCALCIFEROL) 1.25 MG (50000 UNIT) PO CAPS
50000.0000 [IU] | ORAL_CAPSULE | ORAL | 0 refills | Status: AC
  Filled 2024-09-30: qty 12, 84d supply, fill #0

## 2024-09-30 NOTE — Progress Notes (Signed)
 Specialty Pharmacy Refill Coordination Note  Gary Ashley is a 44 y.o. male contacted today regarding refills of specialty medication(s) Emtricitabine -Tenofovir  AF (Descovy )   Patient requested Delivery   Delivery date: 10/04/24   Verified address: [address]  [place]   Medication will be filled on: 10/03/24

## 2024-09-30 NOTE — Progress Notes (Unsigned)
 Vit D low.  He was notified and med sent to his pharmacy

## 2024-10-26 ENCOUNTER — Encounter: Admitting: Physical Medicine & Rehabilitation

## 2024-12-12 ENCOUNTER — Encounter: Admitting: Psychology
